# Patient Record
Sex: Male | Born: 1940 | Race: White | Hispanic: No | Marital: Married | Smoking: Never smoker
Health system: Southern US, Community
[De-identification: ages and names within clinical notes are randomized; demographics above are authoritative.]

## PROBLEM LIST (undated history)

## (undated) DIAGNOSIS — I2510 Atherosclerotic heart disease of native coronary artery without angina pectoris: Secondary | ICD-10-CM

## (undated) DIAGNOSIS — R011 Cardiac murmur, unspecified: Secondary | ICD-10-CM

## (undated) DIAGNOSIS — I10 Essential (primary) hypertension: Secondary | ICD-10-CM

## (undated) DIAGNOSIS — I509 Heart failure, unspecified: Secondary | ICD-10-CM

## (undated) DIAGNOSIS — M1990 Unspecified osteoarthritis, unspecified site: Secondary | ICD-10-CM

## (undated) DIAGNOSIS — H547 Unspecified visual loss: Secondary | ICD-10-CM

## (undated) DIAGNOSIS — I350 Nonrheumatic aortic (valve) stenosis: Secondary | ICD-10-CM

## (undated) DIAGNOSIS — E785 Hyperlipidemia, unspecified: Secondary | ICD-10-CM

## (undated) HISTORY — DX: Hyperlipidemia, unspecified: E78.5

## (undated) HISTORY — DX: Unspecified visual loss: H54.7

## (undated) HISTORY — PX: HERNIA REPAIR: SHX51

## (undated) HISTORY — DX: Nonrheumatic aortic (valve) stenosis: I35.0

## (undated) HISTORY — DX: Cardiac murmur, unspecified: R01.1

---

## 2003-02-08 ENCOUNTER — Inpatient Hospital Stay (HOSPITAL_COMMUNITY): Admission: AD | Admit: 2003-02-08 | Discharge: 2003-02-15 | Payer: Self-pay | Admitting: Cardiology

## 2003-02-08 HISTORY — PX: CARDIAC CATHETERIZATION: SHX172

## 2003-02-09 ENCOUNTER — Encounter (INDEPENDENT_AMBULATORY_CARE_PROVIDER_SITE_OTHER): Payer: Self-pay | Admitting: *Deleted

## 2003-02-09 HISTORY — PX: TISSUE AORTIC VALVE REPLACEMENT: SHX2527

## 2003-04-13 HISTORY — PX: INGUINAL HERNIA REPAIR: SHX194

## 2003-04-23 ENCOUNTER — Ambulatory Visit (HOSPITAL_COMMUNITY): Admission: RE | Admit: 2003-04-23 | Discharge: 2003-04-23 | Payer: Self-pay | Admitting: General Surgery

## 2006-03-23 ENCOUNTER — Ambulatory Visit: Payer: Self-pay | Admitting: Internal Medicine

## 2006-03-25 ENCOUNTER — Ambulatory Visit (HOSPITAL_BASED_OUTPATIENT_CLINIC_OR_DEPARTMENT_OTHER): Admission: RE | Admit: 2006-03-25 | Discharge: 2006-03-25 | Payer: Self-pay | Admitting: Internal Medicine

## 2006-03-28 ENCOUNTER — Ambulatory Visit: Payer: Self-pay | Admitting: Internal Medicine

## 2006-04-16 ENCOUNTER — Ambulatory Visit: Payer: Self-pay | Admitting: Internal Medicine

## 2009-08-21 ENCOUNTER — Ambulatory Visit: Payer: Self-pay | Admitting: Cardiology

## 2010-03-05 ENCOUNTER — Ambulatory Visit (INDEPENDENT_AMBULATORY_CARE_PROVIDER_SITE_OTHER): Payer: Medicare Other | Admitting: Cardiology

## 2010-03-05 DIAGNOSIS — Z954 Presence of other heart-valve replacement: Secondary | ICD-10-CM

## 2010-03-05 DIAGNOSIS — E7800 Pure hypercholesterolemia, unspecified: Secondary | ICD-10-CM

## 2010-05-30 NOTE — Procedures (Signed)
NAME:  [name] [name], [name] [name] NO.:  [id number]   MEDICAL RECORD NO.:  [id number]          PATIENT TYPE:  OUT   LOCATION:  SLEEP CENTER                 FACILITY:  [facility]   PHYSICIAN:  Clinton D. Maple Hudson, MD, FCCP, FACPDATE OF BIRTH:  1940/04/30   DATE OF STUDY:                            NOCTURNAL POLYSOMNOGRAM   INDICATIONS FOR STUDY:  Hypersomnia with sleep apnea. Epworth sleepiness  score 18/24. BMI 25, Weight 187 pounds.   HOME MEDICATIONS:  Listed and reviewed.   SLEEP ARCHITECTURE:  Short total sleep time 232 minutes with sleep  efficiency 65%. Stage 1 was 4%. Stage 2 76%. Stages 3 and 4 absent%. REM  20% of total sleep time. Sleep latency 12 minutes. REM latency 56  minutes. Awake after sleep onset 92 minutes. Arousal index 23.   Aspirin was taken at bedtime.   RESPIRATORY DATA:  Apnea/hypopnea index (AHI/RDI) 14.2 obstructive  events per hour, indicating mild obstructive sleep apnea/hypopnea  syndrome. There were 2 central apneas, 30 obstructive apneas and 23  hypopneas. Events were more common while supine. REM AHI 0. There were  insufficient events to qualify for CPAP titration by split protocol on  this study night.   OXYGEN DATA:  Mild to moderate snoring with an oxygen desaturation to a  nadir of 85%. Mean oxygen saturation through the study was 94% on room  air.   CARDIAC DATA:  Normal sinus rhythm with occasional PVC.   MOVEMENT/PARASOMNIA:  Occasional limb jerk with little effect on sleep.   IMPRESSION/RECOMMENDATION:  1. 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  2. CPAP split protocol criteria were not met on this study night. If      more conservative measures are insufficient, consider return for      CPAP titration or alternative therapies as appropriate.     Clinton D. Maple Hudson, MD, [facility], FACP  Diplomate, Research scientist (medical) of Sleep Medicine  Electronically Signed    CDY/MEDQ  D:  03/28/2006 15:11:44  T:  03/29/2006 10:05:27  Job:  [id number]

## 2010-05-30 NOTE — Assessment & Plan Note (Signed)
[facility]                             PULMONARY OFFICE NOTE   NAME:[name] [name], [name] [name]                          MRN:          [id number]  DATE:03/23/2006                            DOB:          Mar 18, 1940    PULMONARY CONSULTATION   DATE OF CONSULTATION:  March 23, 2006.   PROBLEM:  Sleep medicine consultation at the kind referral of Dr.  Patty Sermons for this 70 year old man whose wife is concerned that he  snores loudly  and is too sleepy.   HISTORY OF PRESENT ILLNESS:  She has wanted him to have a sleep study  for some years and he resisted until he was on Medicare.  She notices he  snores very badly when supine, either in a recliner or in his bed.  He  will have apnea in those situations and sleep is disturbed by kicking  and startling as he gasps.  It has not gotten worse in recent years.  She feels he is apt to fall asleep anytime if he sits quietly.  Bedtime  is between 10 and 11 and he tends to fall asleep in his chair.  Sleep  latency is very short.  He is not much aware of actually awakening at  night before getting up before 4 and 6 A.M.   MEDICATIONS:  1. Aspirin 325 mg.  2. Alphagan and Xalatan eye drops.   ALLERGIES:  No medications allergies.   REVIEW OF SYSTEMS:  Snoring and witnessed apnea's.  Weight has been  stable.  Legs sometimes feel restless just before going to sleep.  Significant daytime sleepiness.  He will take naps in his chair in the  evening.   PAST MEDICAL HISTORY:  Tonsillectomy, glaucoma, aortic valve  replacement.  He denies history of lung or thyroid disease.  Hernia  surgery.   SOCIAL HISTORY:  Never smoked.  No caffeine.  Married with children.  Works as a Visual merchandiser, Development worker, community.  He describes work  as physical and hard.   FAMILY HISTORY:  Nobody recognized with sleep problems.  Others have  allergies, asthma and cancer.   PHYSICAL EXAMINATION:  VITAL SIGNS:  Weight 187 pounds.  Blood  pressure  138/84. Pulse regular 61.  Room air saturation 97%.  GENERAL APPEARANCE:  This is a trim, tall, sturdy-appearing gentleman in  no distress.  HEENT:  Nasal septum is somewhat deviated to the right.  Palate spacing  2 to 3/4.  Voice quality is normal with normal stridor or thyromegaly.  CHEST:  Quiet lung fields with unlabored breathing.  HEART:  There is a grade 1/6 systolic murmur in the aortic space.  Rare  skipped beat.  EXTREMITIES:  There is no restlessness or tremor.  No peripheral edema  or cyanosis.   IMPRESSION:  Probable obstructive sleep apnea.   PLAN:  We are scheduling a split protocol nocturnal polysomnogram with  [facility].  I have discussed good sleep hygiene.  We  have covered the basics of medical issues of obstructive sleep apnea.  They will return after the study is  completed.  I do appreciate the  chance to meet him.     Clinton D. Maple Hudson, MD, [facility], FACP  Electronically Signed    CDY/MedQ  DD: 03/23/2006  DT: 03/25/2006  Job #: [id number]   cc:   [facility]

## 2010-05-30 NOTE — Cardiovascular Report (Signed)
NAME:  [name] [name], [name] [name] NO.:  [id number]   MEDICAL RECORD NO.:  [id number]                   PATIENT TYPE:  OIB   LOCATION:  2860                                 FACILITY:  [facility]   PHYSICIAN:  Peter M. Swaziland, M.D.               DATE OF BIRTH:  14-Mar-1940   DATE OF PROCEDURE:  02/08/2003  DATE OF DISCHARGE:                              CARDIAC CATHETERIZATION   INDICATION FOR PROCEDURE:  The patient is a 70 year old white male who has  been diagnosed with severe aortic stenosis.  History of longstanding murmur.  Mild symptoms of dyspnea on exertion and lightheadedness.   ACCESS:  Via the right femoral artery and vein using standard Seldinger  technique.   EQUIPMENT:  6-French 4-cm right and left Judkins catheters, 6-French pigtail  catheter, 7-French arterial sheath, 8-French venous sheath, and 7-French  balloon-tipped Swan-Ganz catheter.   PROCEDURE:  Right and left heart catheterization, coronary angiography, and  left ventricular angiography.   MEDICATIONS:  Local anesthesia 1% Xylocaine.   COMPLICATIONS:  None.   CONTRAST:  250 mL of Omnipaque.   HEMODYNAMIC DATA:  1. Right atrial pressure is 7/5 with a mean of 5 mmHg.  2. Right ventricular pressure is 28 with EDP of 7 mmHg.  3. Pulmonary artery pressure is 24/8 with a mean of 14 mmHg.  4. Pulmonary capillary wedge pressure is 13/11 with a mean of 9 mmHg.  5. Pullback left ventricular pressure is 185 with EDP of 14.  6. Aortic pressure is 134/61 with a mean of 87 mmHg.  7. Peak aortic valve gradient is 51 mmHg with a mean gradient of 40 mmHg.  8. Aortic valve area is measured at 0.9 cm2.  9. Cardiac output by Fick is 4.5 L/min with an index of 2.26 by     thermodilution.  10.      Cardiac output is 4.9 with an index of 2.46.  11.      There is no significant mitral valve gradient.   ANGIOGRAPHIC DATA:  The left ventricular angiography demonstrates normal  left ventricular size and  contractility with normal systolic function.  Ejection fraction is estimated at 65%.  There is no mitral insufficiency.  The aortic valve is very heavily calcified with reduced mobility.  The  aortic root is mildly dilated.   The left coronary artery arises normally.  There is essentially a shared  ostium between the left circumflex and LAD vessels.  It is extremely  difficult to engage the LAD portion of this vessel.  We did get good  engagement of the left circumflex selectively.  Despite multiple catheter  uses, we were unable to directly engage the LAD but flush shots demonstrated  this vessel well.  The left main coronary artery is normal.  The left  anterior descending artery is normal.  The left circumflex coronary artery  demonstrates a mild eccentric plaque  in the mid vessel up to 30%.  The right  coronary artery is a dominant vessel and appears normal.   FINAL INTERPRETATION:  1. Severe aortic stenosis.  2. Normal left ventricular function.  3. No significant obstructive atherosclerotic coronary artery disease.  4. Normal right heart pressures.                                               Peter M. Swaziland, M.D.    PMJ/MEDQ  D:  02/08/2003  T:  02/09/2003  Job:  [id number]   cc:   Loma Sender  [address]  [place]  [place] [address]  Fax: [phone]   Cassell Clement, M.D.  [address]  [place]  [place] [address]  Fax: [phone]   Lorne Skeens. Hoxworth, M.D.  [address]  [place]  [place] [address]  Fax: [phone]   Gwenith Daily. Tyrone Sage, M.D.  [address]  [place]  [place] [address]

## 2010-05-30 NOTE — Op Note (Signed)
NAME:  [name] [name], [name] [name] NO.:  [id number]   MEDICAL RECORD NO.:  [id number]                   PATIENT TYPE:  INP   LOCATION:  2311                                 FACILITY:  [facility]   PHYSICIAN:  Gwenith Daily. Tyrone Sage, M.D.            DATE OF BIRTH:  Sep 12, 1940   DATE OF PROCEDURE:  02/09/2003  DATE OF DISCHARGE:                                 OPERATIVE REPORT   PREOPERATIVE DIAGNOSIS:  Critical aortic stenosis.   POSTOPERATIVE DIAGNOSIS:  Critical aortic stenosis.   OPERATION/PROCEDURE:  Aortic valve replacement with a #25 pericardial tissue  valve.   SURGEON:  Gwenith Daily. Tyrone Sage, M.D.   FIRST ASSISTANT:  Rowe Clack, P.A.-C.   BRIEF HISTORY:  The patient is a 70 year old male with longstanding known  murmur who presents with increasing symptoms of congestive heart failure and  chest discomfort.  At the same time the patient had continued working full-  time in spite of symptoms.  Because of an inguinal hernia evaluation, he was  again seen followed by cardiology which demonstrated high-grade stenosis of  the aortic valve by echocardiogram.  Because of the symptoms, his hernia  repair was delayed and he was evaluated for aortic valve replacement.  After  discussion with the patient, he decided he did not wish to take Coumadin so  a tissue valve was selected.  A cardiac catheterization was carried out  which showed intact coronary arteries with slight luminal irregularities.  The patient agreed to the procedure and signed informed consent.   DESCRIPTION OF PROCEDURE:  With Swan-Ganz and arterial line monitors in  place, the patient underwent general endotracheal anesthesia.  The skin of  the chest was prepped with Betadine and draped in the usual sterile manner.  Median sternotomy was performed.  Pericardium was opened.  Overall  ventricular function appeared preserved.  The patient did have evidence of  significant left ventricular hypertrophy, both  by observation of the heart  and measurement by the TEE.  The patient was systemically heparinized.  Ascending aorta and aortic root __________.  Cardioplegia needle was  introduced into the ascending aorta.  The patient was placed on  cardiopulmonary bypass 2.4 L per minute per sq m.  His body temperature was  cooled to 28 degrees.  Aortic crossclamp was applied and 500 mL of cold  blood potassium cardioplegia was administered with diastolic arrest of the  heart. Additional cold blood cardioplegia was administered directly into the  right and left coronary ostium.   The aortic valve was then inspected and was very highly calcified and very  distorted though it did appear to be a tricuspid valve with high degree of  calcification but because of the extent of calcification, it was difficult  to determine.  The valve was excised and the annulus debrided, taking care  to remove all calcific debris.  This valve was then excised and the  annulus  sized for a 25 pericardial tissue valve.  Aortic valve was secured in place  with interrupted #2 Ticron pledgeted sutures.  Aortotomy was then closed  with a horizontal mattress 3-0 Prolene suture.   A 16-gauge needle was introduced in the left ventricular apex to further de-  air the heart.  The patient was then ventilated and the aortic crossclamp  was removed with total crossclamp time of 70 minutes.  The patient converted  to a sinus rhythm.  Atrial and ventricular pacing wires applied.  The  patient's body temperature was then rewarmed to 37 degrees.  He was then  ventilated and weaned from cardiopulmonary bypass without difficulty.  He  remained hemodynamically stable.  He was decannulated in the usual fashion.  Protamine sulfate was administered with operative field __________.  Two  ventricular pacing wires were applied.  Two mediastinal tubes were placed.  Sternum was closed with #6 stainless steel wire.  Fascia closed with  interrupted 2-0  Vicryl, running 3-0 Vicryl in the subcutaneous tissue, 4-0  silk subcuticular stitch in the skin edges.  Dry dressings were applied.  Sponge and needle counts were reported as correct at the completion of the  procedure.  The patient tolerated the procedure without obvious  complications and was transferred subsequently to the intensive care unit  for further postoperative care.                                               Gwenith Daily Tyrone Sage, M.D.    Tyson Babinski  D:  02/11/2003  T:  02/11/2003  Job:  [id number]   cc:   Peter M. Swaziland, M.D.  [address]  [place], [place] [address]  Fax: [phone]

## 2010-05-30 NOTE — Op Note (Signed)
NAME:  [name] [name], [name] [name] NO.:  [id number]   MEDICAL RECORD NO.:  [id number]                   PATIENT TYPE:  OIB   LOCATION:  2899                                 FACILITY:  [facility]   PHYSICIAN:  Sharlet Salina T. Hoxworth, M.D.          DATE OF BIRTH:  02-Apr-1940   DATE OF PROCEDURE:  04/23/2003  DATE OF DISCHARGE:                                 OPERATIVE REPORT   PREOPERATIVE DIAGNOSIS:  Right inguinal hernia.   POSTOPERATIVE DIAGNOSIS:  Right inguinal hernia.   OPERATION PERFORMED:  Repair of right inguinal hernia with mesh.   SURGEON:  Lorne Skeens. Hoxworth, M.D.   ANESTHESIA:  Local with intravenous sedation.   INDICATIONS FOR PROCEDURE:  [name] [name] is a 70 year old white male who  presents with a markedly symptomatic right inguinal hernia confirmed on  exam.  Open repair using mesh has been recommended and accepted.  The nature  of the procedure, its indications and risks of bleeding, infection and  recurrence were discussed and understood.  He is now brought to the  operating room for this procedure.   DESCRIPTION OF PROCEDURE:  The patient was brought to the operating room and  placed in supine position on the operating table and intravenous sedation  was administered.  He received preoperative antibiotics.  The right groin  was sterilely prepped and draped.  Local anesthesia was used to block the  ilioinguinal nerve and then to infiltrate the area of the incision.  An  oblique incision was made in the right groin and dissection carried down  through subcutaneous tissue.  Crossing veins doubly clamped, divided and  ligated with 3-0 Vicryl.  The external oblique was identified, anesthetized  and divided along the lines of its fibers through the external ring.  The  ilioinguinal nerve was anesthetized, dissected free and protected throughout  the remainder of the dissection.  The cord was dissected up off the floor at  the pubic tubercle and  cremasteric fibers divided bilaterally completely  freeing the cord back to the internal ring.  The cord, pubic tubercle floor,  inguinal ligament and rectus sheath were anesthetized.  The floor was intact  but there was a good sized indirect sac running with the cord structures.  This was dissected free using blunt cautery dissection up to and above the  level of the internal ring at which point it was suture ligated with 2-0  silk, divided and removed.  There was also a large cord lipoma that was  dissected away from the cord structures with blunt and cautery dissection,  clamped at the internal ring, divided, removed and tied with 3-0 Vicryl.  Following this, a piece of Parietex mesh was trimmed to size to fit the  floor of the canal with tails around the cord of the internal ring. This was  sutured initially to the pubic tubercle and then to the inguinal ligament in  the  iliopubic tract with running 2-0 Prolene working from medial to lateral.  Medially, the mesh was sutured to the edge of the rectus sheath with  interrupted 2-0 Prolene.  Tails were then tacked together lateral to the  cord with interrupted 2-0 Prolene creating a new internal ring snug to a  fingertip.  The cord and ilioinguinal nerves were returned to their anatomic  position.  The external oblique was closed over this with a running 3-0  Vicryl.  The subcu was closed with  running 3-0 Vicryl and skin with running subcuticular 4-0 Monocryl and Steri-  Strips.  Sponge, needle and instrument counts were correct.  Dry sterile  dressings were applied and the patient was taken to recovery in good  condition.                                               Lorne Skeens. Hoxworth, M.D.    Tory Emerald  D:  04/23/2003  T:  04/23/2003  Job:  [id number]

## 2010-05-30 NOTE — H&P (Signed)
NAME:  [name] [name], [name] [name] NO.:  [id number]   MEDICAL RECORD NO.:  [id number]                   PATIENT TYPE:  OIB   LOCATION:                                       FACILITY:  [facility]   PHYSICIAN:  Peter M. Swaziland, M.D.               DATE OF BIRTH:  08-30-1940   DATE OF ADMISSION:  02/08/2003  DATE OF DISCHARGE:                                HISTORY & PHYSICAL   HISTORY OF PRESENT ILLNESS:  [name] [name] is a 70 year old white male who has a  longstanding history of cardiac murmur dating 74 to 30 years.  He was  followed serially at [facility] until approximately 10 years ago at which time he  quit going for his follow-ups.  Approximately a month ago he was being  evaluated for a right inguinal hernia where cardiac evaluation was obtained  to clear him for surgery.  On that evaluation, he had an echocardiogram  which demonstrated critical aortic stenosis.  He had a valve area of 0.5 sq  cm with a peak instantaneous gradient of 155 mmHg and a mean gradient of 75  mmHg.  He also had mild aortic insufficiency.  He had severe LVH with normal  systolic function.  The patient is quite stoic and typically denies any  significant symptoms.  He does admit to feeling heavy in his chest at times  when he is working and feels more tired than normal.  He has also had some  shortness of breath with exertion.  He has had no dizziness, palpitations,  or syncope.  Because of the critical nature of his aortic stenosis, it is  recommended he undergo valve replacement and he is now admitted for cardiac  catheterization as part of his preoperative evaluation.   MEDICATIONS:  A baby aspirin daily.   PAST MEDICAL HISTORY:  Unremarkable.   SOCIAL HISTORY:  The patient works in [employer].  He denies tobacco or  alcohol use.  He is married and has four children.   FAMILY HISTORY:  Father died at age 4 of cancer.  Mother died at age 20 of  cardiac disease.  One brother recently  underwent an aortic valve replacement  for severe aortic stenosis and a congenitally bicuspid valve.  He also has  three other sisters who have been told they had murmurs.   REVIEW OF SYMPTOMS:  As noted in HPI, otherwise noted.   PHYSICAL EXAMINATION:  GENERAL APPEARANCE:  The patient is a pleasant white  male in no apparent distress.  VITAL SIGNS:  Weight 188, blood pressure 140/80, pulse 60 and regular.  HEENT:  Pupils are equal, round and reactive. Conjunctivae clear.  Oropharynx is clear.  Dentition is in good repair.  NECK:  Supple without JVD, adenopathy, thyromegaly or bruits.  His carotid  upstrokes are significantly diminished and delayed with radiating murmur.  LUNGS:  Clear.  CARDIOVASCULAR:  Regular rate and rhythm, normal S1 and S2 with a harsh  grade 3/6 systolic murmur heard best at the aortic area radiating to the  apex and to the carotids.  There is no S3.  ABDOMEN:  Abdomen is soft and nontender without masses or bruits.  EXTREMITIES:  Without edema.  Pulses 2+ and symmetric.  The patient does  have a large right inguinal hernia.  NEUROLOGIC:  Examination is nonfocal.   LABORATORY DATA:  ECG shows sinus bradycardia with LVH and significant  strain.  There is left axis deviation.  Echocardiogram  is as noted.   Chest x-ray shows no active disease with cardiomegaly.   His coags are normal.  CMET is normal with a BUN of 22, creatinine 1.0.  Other electrolytes are normal.  CBC is normal.   IMPRESSION:  1. Critical aortic stenosis, mildly symptomatic.  2. Right inguinal hernia.   PLAN:  Admit for cardiac catheterization to be followed the next day with  aortic valve replacement.                                                Peter M. Swaziland, M.D.    PMJ/MEDQ  D:  02/06/2003  T:  02/06/2003  Job:  [id number]   cc:   Gwenith Daily. Tyrone Sage, M.D.  [address]  [place]  [place] [address]   Lorne Skeens. Hoxworth, M.D.  [address]  [place]  [place]  [address]  Fax: [phone]   Loma Sender  [address]  [place]  [place] [address]  Fax: [phone]   Cassell Clement, M.D.  [address], [address]  [place]  [place] [address]  Fax: [phone]

## 2010-05-30 NOTE — Assessment & Plan Note (Signed)
[facility]                             PULMONARY OFFICE NOTE   NAME:[name] [name], [name] [name]                          MRN:          [id number]  DATE:04/16/2006                            DOB:          09-21-1940    PROBLEM:  Obstructive sleep apnea.   HISTORY:  His sleep study demonstrated mild to moderate obstructive  apnea with an index of 14.2 per hour. Events were more common while  supine. Snoring was moderate with desaturation to 85%. He could not be  titrated for CPAP by protocol on that study night. He and his wife say  that he is worse while sleeping in a recliner or supine. In either case,  the problem is laying flat back. If he tips his head to the side, his  wife notices snoring is much less.   OBJECTIVE:  Weight 189 pounds, blood pressure 122/64, pulse regular 48,  room air saturation 98%. He is a trim, muscular man. He is alert. His  nasal airway is clear. Lungs are clear. Heart sounds are regular without  murmur.   IMPRESSION:  Mild obstructive apnea. We discussed treatment options. He  may become a candidate for CPAP titration, but I emphasized conservative  measures first. He certainly does not need to lose weight, but it may  help substantially if he can stay off of his back.   PLAN:  1. Try sample Veramyst, 2 sprays each nostril daily.  2. Sleep off of flat of back.  3. Booklet on sleep apnea was given.  4. Schedule return once more in a month.     Clinton D. Maple Hudson, MD, Tonny Bollman, FACP  Electronically Signed    CDY/MedQ  DD: 04/18/2006  DT: 04/19/2006  Job #: [id number]   cc:   Cassell Clement, M.D.

## 2010-05-30 NOTE — Discharge Summary (Signed)
NAME:  [name] [name], [name] [name] NO.:  [id number]   MEDICAL RECORD NO.:  [id number]                   PATIENT TYPE:  INP   LOCATION:  2004                                 FACILITY:  [facility]   PHYSICIAN:  Gwenith Daily. Tyrone Sage, M.D.            DATE OF BIRTH:  Apr 22, 1940   DATE OF ADMISSION:  02/08/2003  DATE OF DISCHARGE:  02/15/2003                                 DISCHARGE SUMMARY   HISTORY OF PRESENT ILLNESS:  This is a 70 year old male with a longstanding  known murmur who presented recently with increasing symptoms of congestive  heart failure and chest discomfort.  The patient continued to work in spite  of symptoms.  He was being evaluated for an inguinal hernia repair, and  cardiology was again consulted, and echocardiogram confirmed a high-grade  stenosis of the aortic valve.  The patient was recommended for aortic valve  replacement, and consultation was obtained with Sheliah Plane, M.D.  He  evaluated the patient and his studies and recommended proceeding with the  surgery.  The patient had an echocardiogram calculated valve area of 0.5  with a mean gradient of 75 mmHg.  He had mild aortic insufficiency with  severe left ventricular hypertrophy but normal left ventricular function.  He was admitted this hospitalization for the procedure.   PAST MEDICAL HISTORY:  Unremarkable.   FAMILY HISTORY:  Remarkable for brother who had aortic valve replacement  November 2004.  Additionally, all three sisters have heart murmurs, and all  four of his children have heart murmurs.   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   CURRENT MEDICATIONS:  1. Timolol eye drops.  2. Aspirin 1 every other day.   REVIEW OF SYSTEMS:  Please see History and Physical done at the time of  admission.   PHYSICAL EXAMINATION:  Please see History and Physical done at the time of  admission.   HOSPITAL COURSE:  The patient was admitted  electively and on February 09, 2003, taken to the operating room where he underwent the following  procedure.  An aortic valve replacement with a #25 pericardial tissue valve.  The procedure was performed by Sheliah Plane, M.D.  The patient tolerated  the procedure well and was taken to the surgical intensive care unit in  stable condition.   POSTOPERATIVE HOSPITAL COURSE:  The patient has done well.  His incisions  are healing without signs of infection.  He has been started on Coumadin but  is not felt required to be therapeutic prior to discharge.  Most recent INR  on February 15, 2003, is 1.3, and he will receive 7.5 mg and have INR checked  in morning at Dr. Yevonne Pax office.  His rhythm is currently stable, but  he did have postoperative atrial fibrillation and was initially treated with  both beta blockers and amiodarone.  However, the amiodarone  has been  discontinued secondary to nausea.  All routine lines, monitors, and drainage  devices are discontinued in the standard fashion.  The patient is  hemodynamically stable. Laboratory values are felt to be stable.  Most  recent hemoglobin and hematocrit dated January 13, 2003, revealing moderate  anemia with hemoglobin 9.8 and hematocrit 28.6, respectively.  Electrolytes:  BUN and creatinine are stable and  within normal limits.  He has had  slightly elevated glucose intermittently on non-fasting samples.  Liver  function tests dated February 09, 2003, were  within normal limits.  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  The  patient is tolerating cardiac rehabilitation phase I modalities without  significant difficulty.  Apparently he is felt to be quite stable for  discharge on today's date, February 15, 2003.   DISCHARGE MEDICATIONS:  1. Coumadin 5 mg daily and as directed by Dr. Patty Sermons.  2. Toprol XL 25 mg daily.  3. He is to continue his Timolol eye drops as before.  4. For pain, Tylox 1 or 2  every 4 to 6 hours as needed.   INSTRUCTIONS:  The patient received written instructions in regards to  medications, activity, diet, wound care, and followup to include Dr.  Tyrone Sage February 24 at 12:10 p.m.  Dr. Sw[place] or Dr. Patty Sermons will see the  patient in two weeks.  They will also monitor his Coumadin.   FINAL DIAGNOSES:  1. Severe aortic stenosis as described.  2. Postoperative anemia.  3. Postoperative atrial fibrillation.      Rowe Clack, P.A.-C.                    Gwenith Daily Tyrone Sage, M.D.    Sherryll Burger  D:  02/15/2003  T:  02/15/2003  Job:  [id number]   cc:   Gwenith Daily. Tyrone Sage, M.D.  [address]  [place]  [place] [address]   Peter M. Sw[place], M.D.  [address]  [place], [place] [address]  Fax: [phone]

## 2010-05-30 NOTE — Op Note (Signed)
NAME:  [name] [name], [name] [name] NO.:  [id number]   MEDICAL RECORD NO.:  [id number]                   PATIENT TYPE:  INP   LOCATION:  2311                                 FACILITY:  [facility]   PHYSICIAN:  Judie Petit, M.D.              DATE OF BIRTH:  Jan 07, 1941   DATE OF PROCEDURE:  02/09/2003  DATE OF DISCHARGE:                                 OPERATIVE REPORT   INDICATIONS:  [name] is a 70 year old male who presents today for aortic  valve replacement by Dr. Sheliah Plane.  Transesophageal echocardiogram  will be utilized for cardiac structure and function.   DESCRIPTION OF PROCEDURE:  The patient was brought to the holding area the  morning of surgery where radial artery and pulmonary artery lines were  inserted under local anesthesia with sedation.  The patient was then taken  to the operating room for routine induction of anesthesia.  The trachea was  intubated.  Following intubation of the trachea, the transesophageal  echocardiogram probe was passed oropharyngeally without difficulty.   PRECARDIOPULMONARY BYPASS TRANSESOPHAGEAL ECHOCARDIOGRAM:  Left ventricle:  The ventricle was concentrically thickened.  Papillary muscles were well  outlined.  There were no masses notable within the left ventricular chamber.  There appeared to be normal ejection fraction.   Aortic valve:  The aortic valve appeared to be trileaflet in nature.  However, heavily calcified.  There was severe aortic stenosis noted.  There  was 1+ aortic regurgitant flow noted on Doppler examination.   Mitral valve:  The valve appeared to function appropriately.  There was  trace mitral regurgitant flow on Doppler examination.  There was no reversal  of flow back pulse wave examination.   Left atrium:  The left atrial appendage was clear of any masses.  The  interatrial septum appeared intact.   Right ventricle:  The right ventricle appeared grossly within normal limits.  There  was trace tricuspid regurgitant flow on Doppler examination.   The patient was placed on cardiopulmonary bypass.  Aortic valve replacement  was carried out.  De-airing maneuvers were carried out.  The patient was  rewarmed and separated from cardiopulmonary bypass with the initial attempt.   POST CARDIOPULMONARY BYPASS TRANSESOPHAGEAL ECHOCARDIOGRAM:  Limited  examination.   Left ventricle:  In the early bypass period the chamber revealed evidence of  low volume but with replacement of volume, the contractile pattern improved.  There was __________evidence of good ejection fraction.   Aortic valve:  The aortic valve was seated in place.  There was no  significant regurgitant flow noted.  There did not appear to be any  obstruction to flow.   The rest of the cardiac exam was as previously described.  The patient was  subsequently taken to the cardiac intensive care unit in stable condition.  Judie Petit, M.D.    CE/MEDQ  D:  02/09/2003  T:  02/10/2003  Job:  [id number]

## 2011-03-06 ENCOUNTER — Encounter: Payer: Self-pay | Admitting: *Deleted

## 2011-03-06 DIAGNOSIS — G4710 Hypersomnia, unspecified: Secondary | ICD-10-CM | POA: Insufficient documentation

## 2011-03-06 DIAGNOSIS — I350 Nonrheumatic aortic (valve) stenosis: Secondary | ICD-10-CM | POA: Insufficient documentation

## 2011-03-06 DIAGNOSIS — H409 Unspecified glaucoma: Secondary | ICD-10-CM | POA: Insufficient documentation

## 2011-03-11 ENCOUNTER — Ambulatory Visit (INDEPENDENT_AMBULATORY_CARE_PROVIDER_SITE_OTHER): Payer: Medicare Other | Admitting: Cardiology

## 2011-03-11 ENCOUNTER — Encounter: Payer: Self-pay | Admitting: Cardiology

## 2011-03-11 VITALS — BP 120/68 | HR 50 | Ht 72.0 in | Wt 177.0 lb

## 2011-03-11 DIAGNOSIS — Z952 Presence of prosthetic heart valve: Secondary | ICD-10-CM

## 2011-03-11 DIAGNOSIS — N400 Enlarged prostate without lower urinary tract symptoms: Secondary | ICD-10-CM

## 2011-03-11 DIAGNOSIS — N401 Enlarged prostate with lower urinary tract symptoms: Secondary | ICD-10-CM | POA: Insufficient documentation

## 2011-03-11 DIAGNOSIS — E7800 Pure hypercholesterolemia, unspecified: Secondary | ICD-10-CM

## 2011-03-11 DIAGNOSIS — I350 Nonrheumatic aortic (valve) stenosis: Secondary | ICD-10-CM

## 2011-03-11 DIAGNOSIS — Z8042 Family history of malignant neoplasm of prostate: Secondary | ICD-10-CM

## 2011-03-11 DIAGNOSIS — Z954 Presence of other heart-valve replacement: Secondary | ICD-10-CM

## 2011-03-11 DIAGNOSIS — I359 Nonrheumatic aortic valve disorder, unspecified: Secondary | ICD-10-CM

## 2011-03-11 NOTE — Assessment & Plan Note (Signed)
The patient has been doing well following his aortic valve replacement.  He is not having any exertional chest pain or shortness of breath.  Has had no dizziness or syncope.  He has not been aware of any palpitations.  He is aware of SBE prophylaxis.  He has not been to his dentist in over a year however

## 2011-03-11 NOTE — Assessment & Plan Note (Signed)
The patient has a history of hypercholesterolemia.  He has not had a recent cholesterol drawn.  He is not presently on any statin therapy.  We are checking lipids today

## 2011-03-11 NOTE — Progress Notes (Signed)
[name] [name] Date of Birth:  1940-03-06 [facility] [address] [place], [place]  [address] [phone]  Fax   [phone]  HPI: This pleasant 71 year old gentleman is seen for a scheduled followup office visit.  He has a history of previous severe aortic stenosis and has a aortic valve replacement with a tissue valve.  He also has a history of hypercholesterolemia.  He also has a history of BPH with nocturia and his PSA a year ago was 3.31  Current Outpatient Prescriptions  Medication Sig Dispense Refill  . amoxicillin (AMOXIL) 500 MG tablet Take 500 mg by mouth as needed. Re-valve replacement      . aspirin 325 MG tablet Take 325 mg by mouth daily.      Marland Kitchen CRANBERRY PO Take by mouth.      . Ibuprofen 200 MG CAPS Take by mouth. As needed      . latanoprost (XALATAN) 0.005 % ophthalmic solution 1 drop at bedtime.      . Omega-3 Fatty Acids (FISH OIL PO) Take by mouth daily.      . Saw Palmetto, Serenoa repens, (SAW PALMETTO PO) Take by mouth daily.      . vitamin C (ASCORBIC ACID) 500 MG tablet Take 500 mg by mouth daily.        No Known Allergies  Patient Active Problem List  Diagnoses  . Aortic stenosis  . Sleep apnea  . Glaucoma    History  Smoking status  . Never Smoker   Smokeless tobacco  . Not on file    History  Alcohol Use     Family History  Problem Relation Age of Onset  . Heart disease Mother   . Cancer Father     Review of Systems: The patient denies any heat or cold intolerance.  No weight gain or weight loss.  The patient denies headaches or blurry vision.  There is no cough or sputum production.  The patient denies dizziness.  There is no hematuria or hematochezia.  The patient denies any muscle aches or arthritis.  The patient denies any rash.  The patient denies frequent falling or instability.  There is no history of depression or anxiety.  All other systems were reviewed and are negative.   Physical Exam: Filed Vitals:     03/11/11 1635  BP: 120/68  Pulse: 50   the general appearance reveals a well-developed well-nourished gentleman in no distress.Pupils equal and reactive.   Extraocular Movements are full.  There is no scleral icterus.  The mouth and pharynx are normal.  The neck is supple.  The carotids reveal no bruits.  The jugular venous pressure is normal.  The thyroid is not enlarged.  There is no lymphadenopathy.  The chest is clear to percussion and auscultation. There are no rales or rhonchi. Expansion of the chest is symmetrical.  Heart reveals a grade 2/6 systolic ejection murmur across the prosthetic aortic valve.  No diastolic murmur.  No gallop or rub.The abdomen is soft and nontender. Bowel sounds are normal. The liver and spleen are not enlarged. There Are no abdominal masses. There are no bruits.  The pedal pulses are good.  There is no phlebitis or edema.  There is no cyanosis or clubbing. Strength is normal and symmetrical in all extremities.  There is no lateralizing weakness.  There are no sensory deficits.      Assessment / Plan: Continue same medication.  Her weight results of today's labs.  Recheck in  6 months for office visit and fasting lab work.

## 2011-03-11 NOTE — Assessment & Plan Note (Signed)
The patient has a history of symptoms of BPH with nocturia.  He is no longer taking his saw palmetto.  His father had prostate cancer and died at about the age of [name] [name].  We are checking a PSA today

## 2011-03-11 NOTE — Patient Instructions (Signed)
Will obtain labs today and call you with the results (LP/HFP/BMET/PSA). Your physician recommends that you continue on your current medications as directed. Please refer to the Current Medication list given to you today. Your physician wants you to follow-up in: 6  months You will receive a reminder letter in the mail two months in advance. If you don't receive a letter, please call our office to schedule the follow-up appointment.

## 2011-03-12 LAB — BASIC METABOLIC PANEL
BUN: 21 mg/dL (ref 6–23)
Calcium: 8.8 mg/dL (ref 8.4–10.5)
Creatinine, Ser: 0.7 mg/dL (ref 0.4–1.5)
GFR: 110.98 mL/min (ref >60.00)
Glucose, Bld: 77 mg/dL (ref 70–99)
Potassium: 4.3 mEq/L (ref 3.5–5.1)

## 2011-03-12 LAB — LIPID PANEL
Cholesterol: 194 mg/dL (ref 0–200)
VLDL: 8.2 mg/dL (ref 0.0–40.0)

## 2011-03-12 LAB — HEPATIC FUNCTION PANEL
ALT: 29 U/L (ref 0–53)
AST: 25 U/L (ref 0–37)
Alkaline Phosphatase: 64 U/L (ref 39–117)
Bilirubin, Direct: 0.1 mg/dL (ref 0.0–0.3)
Total Bilirubin: 0.6 mg/dL (ref 0.3–1.2)
Total Protein: 6.2 g/dL (ref 6.0–8.3)

## 2011-03-15 NOTE — Progress Notes (Signed)
Quick Note:  Please report to patient. The recent labs are stable. Continue same medication and careful diet. LDL is still high. Watch diet more carefully. PSA not yet available? ______

## 2011-03-20 ENCOUNTER — Telehealth: Payer: Self-pay | Admitting: Cardiology

## 2011-03-20 NOTE — Telephone Encounter (Signed)
Lab results given to pt.  Copy left at the front desk per his request.

## 2011-03-20 NOTE — Telephone Encounter (Signed)
Pt would like lab test results and if you can't reach him on the listed above please call him on [phone] he also would like to pick up a copy or have it mailed to him

## 2011-04-29 ENCOUNTER — Telehealth: Payer: Self-pay | Admitting: Cardiology

## 2011-04-29 NOTE — Telephone Encounter (Signed)
We should check it now rather than waiting since it was close to high a year ago.

## 2011-04-29 NOTE — Telephone Encounter (Signed)
New msg Pt's wife called and wants to know psa results

## 2011-04-29 NOTE — Telephone Encounter (Signed)
Last PSA 3.31 in Feb 2012 and was not drawn.  Had checked with Mellody Dance in the lab and he said that [name] [name] was suppose to call and get patient to come back in for recheck.  Wife stated no one had called.  Wife wanted to know if ok to wait until next ov in August or recheck before.  Will forward to  Dr. Patty Sermons for review

## 2011-04-30 NOTE — Telephone Encounter (Signed)
Advised wife and scheduled

## 2011-05-06 ENCOUNTER — Other Ambulatory Visit: Payer: Self-pay | Admitting: *Deleted

## 2011-05-06 ENCOUNTER — Other Ambulatory Visit (INDEPENDENT_AMBULATORY_CARE_PROVIDER_SITE_OTHER): Payer: Medicare Other

## 2011-05-06 DIAGNOSIS — R351 Nocturia: Secondary | ICD-10-CM

## 2011-05-06 DIAGNOSIS — Z125 Encounter for screening for malignant neoplasm of prostate: Secondary | ICD-10-CM

## 2011-05-06 DIAGNOSIS — E7800 Pure hypercholesterolemia, unspecified: Secondary | ICD-10-CM

## 2011-05-06 DIAGNOSIS — N401 Enlarged prostate with lower urinary tract symptoms: Secondary | ICD-10-CM

## 2011-05-07 LAB — PSA: PSA: 5.87 ng/mL — ABNORMAL HIGH (ref 0.10–4.00)

## 2011-05-19 ENCOUNTER — Telehealth: Payer: Self-pay | Admitting: Cardiology

## 2011-05-19 NOTE — Telephone Encounter (Signed)
Advised wife of labs.  She will call back with urologist of [facility]

## 2011-05-19 NOTE — Telephone Encounter (Signed)
Pt had a blood test and wife calling for results

## 2011-06-09 NOTE — Telephone Encounter (Signed)
Left message to call back  

## 2011-06-17 NOTE — Progress Notes (Signed)
We could have him come in for a repeat PSA but he would have to understand that insurance would most likely not cover the repeat test and he would probably have to pay for the test himself.

## 2011-12-03 ENCOUNTER — Encounter: Payer: Self-pay | Admitting: Cardiology

## 2011-12-03 ENCOUNTER — Ambulatory Visit (INDEPENDENT_AMBULATORY_CARE_PROVIDER_SITE_OTHER): Payer: Medicare Other | Admitting: Cardiology

## 2011-12-03 VITALS — BP 140/70 | HR 54 | Resp 17 | Ht 71.0 in | Wt 174.4 lb

## 2011-12-03 DIAGNOSIS — I359 Nonrheumatic aortic valve disorder, unspecified: Secondary | ICD-10-CM

## 2011-12-03 DIAGNOSIS — Z954 Presence of other heart-valve replacement: Secondary | ICD-10-CM

## 2011-12-03 DIAGNOSIS — I350 Nonrheumatic aortic (valve) stenosis: Secondary | ICD-10-CM

## 2011-12-03 DIAGNOSIS — E7800 Pure hypercholesterolemia, unspecified: Secondary | ICD-10-CM

## 2011-12-03 DIAGNOSIS — R972 Elevated prostate specific antigen [PSA]: Secondary | ICD-10-CM

## 2011-12-03 DIAGNOSIS — Z952 Presence of prosthetic heart valve: Secondary | ICD-10-CM

## 2011-12-03 DIAGNOSIS — N401 Enlarged prostate with lower urinary tract symptoms: Secondary | ICD-10-CM

## 2011-12-03 NOTE — Patient Instructions (Addendum)
Your physician recommends that you continue on your current medications as directed. Please refer to the Current Medication list given to you today.  Your physician wants you to follow-up in: 6 months with fasting labs (lp/bmet/hfp)  You will receive a reminder letter in the mail two months in advance. If you don't receive a letter, please call our office to schedule the follow-up appointment.   Appointment with Dr Laverle Patter on February 10, 2012 at 10:00 am , fill out paper work you receive, take list of medications, photo ID, and insurance cards with you

## 2011-12-03 NOTE — Telephone Encounter (Signed)
Patient seen today, has appt with urologist in January

## 2011-12-03 NOTE — Assessment & Plan Note (Signed)
The patient has a past history of aortic valve replacement with a tissue valve.  He is not having any symptoms of congestive heart failure.  His legs feel a little weaker but it may be because he has less physically active.  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

## 2011-12-03 NOTE — Assessment & Plan Note (Signed)
The patient has a history of hypercholesterolemia.  He has not been willing to take any cholesterol-lowering medication.  He did not come fasting today.

## 2011-12-03 NOTE — Progress Notes (Signed)
[name] [name] Date of Birth:  1940-03-22 [facility] [address] [place], [place]  [address] [phone]         Fax   [phone]  History of Present Illness: This pleasant 71 year old gentleman is seen for a scheduled followup office visit. 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 He also has a history of hypercholesterolemia. He also has a history of BPH with nocturia and his PSA a year ago was 3.31, and a repeat PSA on 05/06/11 was elevated at 5.87.  He was advised at that time to see a urologist but has not done so.  Of note is the fact that he tells me that his father died of prostate cancer with bony metastases at age 64 which is about the patient's present age.   Current Outpatient Prescriptions  Medication Sig Dispense Refill  . amoxicillin (AMOXIL) 500 MG tablet Take 500 mg by mouth as needed. Re-valve replacement      . aspirin 325 MG tablet Take 325 mg by mouth daily.      Marland Kitchen CRANBERRY PO Take by mouth.      . Ibuprofen 200 MG CAPS Take by mouth. As needed      . latanoprost (XALATAN) 0.005 % ophthalmic solution 1 drop at bedtime.      . Omega-3 Fatty Acids (FISH OIL PO) Take by mouth daily.      . Saw Palmetto, Serenoa repens, (SAW PALMETTO PO) Take by mouth daily.      . vitamin C (ASCORBIC ACID) 500 MG tablet Take 500 mg by mouth daily.        No Known Allergies  Patient Active Problem List  Diagnosis  . Aortic stenosis  . Sleep apnea  . Glaucoma  . Hypercholesterolemia  . BPH associated with nocturia    History  Smoking status  . Never Smoker   Smokeless tobacco  . Not on file    History  Alcohol Use     Family History  Problem Relation Age of Onset  . Heart disease Mother   . Cancer Father     Review of Systems: Constitutional: no fever chills diaphoresis or fatigue or change in weight.  Head and neck: no hearing loss, no epistaxis, no photophobia or  visual disturbance. Respiratory: No cough, shortness of breath or wheezing. Cardiovascular: No chest pain peripheral edema, palpitations. Gastrointestinal: No abdominal distention, no abdominal pain, no change in bowel habits hematochezia or melena. Genitourinary: No dysuria, no frequency, no urgency, no nocturia. Musculoskeletal:No arthralgias, no back pain, no gait disturbance or myalgias. Neurological: No dizziness, no headaches, no numbness, no seizures, no syncope, no weakness, no tremors. Hematologic: No lymphadenopathy, no easy bruising. Psychiatric: No confusion, no hallucinations, no sleep disturbance.    Physical Exam: Filed Vitals:   12/03/11 1057  BP: 140/70  Pulse: 54  Resp: 17   general appearance reveals a well-developed well-nourished gentleman in no distress.The head and neck exam reveals pupils equal and reactive.  Extraocular movements are full.  There is no scleral icterus.  The mouth and pharynx are normal.  The neck is supple.  The carotids reveal no bruits.  The jugular venous pressure is normal.  The  thyroid is not enlarged.  There is no lymphadenopathy.  The chest is clear to percussion and auscultation.  There are no rales or rhonchi.  Expansion of the chest is symmetrical.  The precordium is quiet.  The first heart sound is normal.  The second heart sound is physiologically split.  There is grade 2/6 systolic flow murmur across his prosthetic aortic valve.  There is no diastolic murmur.  There is no abnormal lift or heave.  The abdomen is soft and nontender.  The bowel sounds are normal.  The liver and spleen are not enlarged.  There are no abdominal masses.  There are no abdominal bruits.  Extremities reveal good pedal pulses.  There is no phlebitis or edema.  There is no cyanosis or clubbing.  Strength is normal and symmetrical in all extremities.  There is no lateralizing weakness.  There are no sensory deficits.  The skin is warm and dry.  There is no  rash.     Assessment / Plan: Referral to Dr. Heloise Purpura concerning possible prostate cancer.  We did not repeat any lab work today. Recheck in 6 months for followup office visit lipid panel hepatic function panel and basal metabolic panel fasting.

## 2011-12-03 NOTE — Assessment & Plan Note (Signed)
The patient has had a rapid rise in his PSA.  It was 5.87 in 05/12/2011.  His father died of prostate cancer.  I have urged him to follow through with a neurology evaluation and we will refer him to Dr. Heloise Purpura.

## 2011-12-15 ENCOUNTER — Telehealth: Payer: Self-pay | Admitting: Cardiology

## 2011-12-15 DIAGNOSIS — Z792 Long term (current) use of antibiotics: Secondary | ICD-10-CM

## 2011-12-15 MED ORDER — AMOXICILLIN 500 MG PO TABS: ORAL_TABLET | ORAL | Status: DC

## 2011-12-15 NOTE — Telephone Encounter (Signed)
Pt going to the dentist for a cleaning on Friday, and may have a procedure while there, does he need abx? [facility] in [place], pls call [phone]

## 2011-12-15 NOTE — Telephone Encounter (Signed)
Spoke with wife and she requested larger quantity be called since patient had not been to dentist in years.  Sent to pharmacy

## 2011-12-15 NOTE — Telephone Encounter (Signed)
Amoxicillin 500 mg 4 tablets one hour before dental work. He can have 20 tablets, that will be enough for 5 visits.  Refill x2

## 2012-10-12 ENCOUNTER — Encounter: Payer: Self-pay | Admitting: Cardiology

## 2012-10-12 ENCOUNTER — Ambulatory Visit (INDEPENDENT_AMBULATORY_CARE_PROVIDER_SITE_OTHER): Payer: Medicare Other | Admitting: Cardiology

## 2012-10-12 VITALS — BP 138/60 | HR 60 | Ht 72.0 in | Wt 171.0 lb

## 2012-10-12 DIAGNOSIS — N401 Enlarged prostate with lower urinary tract symptoms: Secondary | ICD-10-CM

## 2012-10-12 DIAGNOSIS — N138 Other obstructive and reflux uropathy: Secondary | ICD-10-CM

## 2012-10-12 DIAGNOSIS — Z954 Presence of other heart-valve replacement: Secondary | ICD-10-CM

## 2012-10-12 DIAGNOSIS — N400 Enlarged prostate without lower urinary tract symptoms: Secondary | ICD-10-CM

## 2012-10-12 DIAGNOSIS — E7800 Pure hypercholesterolemia, unspecified: Secondary | ICD-10-CM

## 2012-10-12 DIAGNOSIS — Z952 Presence of prosthetic heart valve: Secondary | ICD-10-CM

## 2012-10-12 NOTE — Assessment & Plan Note (Signed)
His symptoms of BPH are stable and is PSA is down to 3.2 by eating pumpkin seeds on his salad daily.

## 2012-10-12 NOTE — Assessment & Plan Note (Signed)
The patient has a history of hypercholesterolemia.  This is followed by his PCP and his recent LDL was 109 on 08/10/12.

## 2012-10-12 NOTE — Patient Instructions (Addendum)
You need to go for a chest xray at the [facility] building across from [facility] soon  Your physician has requested that you have an echocardiogram. Echocardiography is a painless test that uses sound waves to create images of your heart. It provides your doctor with information about the size and shape of your heart and how well your heart's chambers and valves are working. This procedure takes approximately one hour. There are no restrictions for this procedure.   Your physician wants you to follow-up in: 1 year You will receive a reminder letter in the mail two months in advance. If you don't receive a letter, please call our office to schedule the follow-up appointment.   DECREASE YOUR ASPIRIN TO 81 MG DAILY

## 2012-10-12 NOTE — Progress Notes (Signed)
[name] [name] Date of Birth:  1940/07/30 [facility] [address] [place], [place]  [address] [phone]         Fax   [phone]  History of Present Illness: This pleasant 72 year old gentleman is seen for a scheduled followup office visit. 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  His surgery was by Dr. Tyrone Sage in 02/09/03.  He has a head wound is by a science bovine pericardial aortic tissue graft size 25 mm.  At the time of his cardiac catheterization he did not have any significant coronary disease and did not require bypass surgery. He also has a history of hypercholesterolemia. He also has a history of BPH with nocturia and his PSA a year ago was 3.31, and a repeat PSA on 05/06/11 was elevated at 5.87.  He was advised at that time to see a urologist but did not see one.  He saw his PCP.  The patient started eating pumpkinseed's and this has brought his PSA down to its current level of 3.2.  Of note is the fact that he tells me that his father died of prostate cancer with bony metastases at age 55.    Current Outpatient Prescriptions  Medication Sig Dispense Refill  . amoxicillin (AMOXIL) 500 MG tablet 4 tablets 1 hour prior to procedure  20 tablet  1  . aspirin 81 MG tablet Take 81 mg by mouth daily.      Marland Kitchen CRANBERRY PO Take by mouth.      . Ibuprofen 200 MG CAPS Take by mouth. As needed      . latanoprost (XALATAN) 0.005 % ophthalmic solution 1 drop at bedtime.      . vitamin C (ASCORBIC ACID) 500 MG tablet Take 500 mg by mouth daily.       No current facility-administered medications for this visit.    No Known Allergies  Patient Active Problem List   Diagnosis Date Noted  . H/O aortic valve replacement with tissue graft 10/12/2012  . Hypercholesterolemia 03/11/2011  . BPH associated with nocturia 03/11/2011  . Aortic stenosis   . Sleep apnea   . Glaucoma     History  Smoking  status  . Never Smoker   Smokeless tobacco  . Not on file    History  Alcohol Use     Family History  Problem Relation Age of Onset  . Heart disease Mother   . Cancer Father     Review of Systems: Constitutional: no fever chills diaphoresis or fatigue or change in weight.  Head and neck: no hearing loss, no epistaxis, no photophobia or visual disturbance. Respiratory: No cough, shortness of breath or wheezing. Cardiovascular: No chest pain peripheral edema, palpitations. Gastrointestinal: No abdominal distention, no abdominal pain, no change in bowel habits hematochezia or melena. Genitourinary: No dysuria, no frequency, no urgency, no nocturia. Musculoskeletal:No arthralgias, no back pain, no gait disturbance or myalgias. Neurological: No dizziness, no headaches, no numbness, no seizures, no syncope, no weakness, no tremors. Hematologic: No lymphadenopathy, no easy bruising. Psychiatric: No confusion, no hallucinations, no sleep disturbance.    Physical Exam: Filed Vitals:   10/12/12 1443  BP: 138/60  Pulse: 60   general appearance reveals a well-developed well-nourished gentleman in no distress.The head and neck exam reveals pupils equal and reactive.  Extraocular movements are full.  There is no scleral icterus.  The mouth and pharynx are normal.  The  neck is supple.  The carotids reveal no bruits.  The jugular venous pressure is normal.  The  thyroid is not enlarged.  There is no lymphadenopathy.  The chest is clear to percussion and auscultation.  There are no rales or rhonchi.  Expansion of the chest is symmetrical.  The precordium is quiet.  The first heart sound is normal.  The second heart sound is physiologically split.  There is grade 2/6 systolic flow murmur across his prosthetic aortic valve.  There is no diastolic murmur.  There is no abnormal lift or heave.  The abdomen is soft and nontender.  The bowel sounds are normal.  The liver and spleen are not enlarged.   There are no abdominal masses.  There are no abdominal bruits.  Extremities reveal good pedal pulses.  There is no phlebitis or edema.  There is no cyanosis or clubbing.  Strength is normal and symmetrical in all extremities.  There is no lateralizing weakness.  There are no sensory deficits.  The skin is warm and dry.  There is no rash.  EKG shows sinus bradycardia with first degree AV block and nonspecific T-wave flattening and is essentially unchanged since 03/11/11 except that PVCs are now absent   Assessment / Plan:  We are getting a chest x-ray to look at heart size. The patient will return for a two-dimensional echocardiogram to followup on his bioprosthetic aortic valve replacement dated 2005 Recheck in one year intervals for office visit and EKG, or sooner when necessary We reminded him again about the need for SBE prophylaxis at the time of dental work etc.

## 2012-10-12 NOTE — Assessment & Plan Note (Signed)
The patient has not been having any chest pain or angina or shortness of breath.  He has been taking a fall 325 mg aspirin daily and we will cut this down to just 81 mg because of excessive skin bruising.  He has not had an echocardiogram since his open-heart surgery.  We will update his echo.  He still has a prominent systolic flow murmur across the aortic valve on auscultation

## 2012-11-03 ENCOUNTER — Other Ambulatory Visit (HOSPITAL_COMMUNITY): Payer: No Typology Code available for payment source

## 2012-11-14 ENCOUNTER — Ambulatory Visit
Admission: RE | Admit: 2012-11-14 | Discharge: 2012-11-14 | Disposition: A | Discharge status: Home / Self Care | Payer: PRIVATE HEALTH INSURANCE | Source: Ambulatory Visit | Attending: Cardiology | Admitting: Cardiology

## 2012-11-14 ENCOUNTER — Encounter (INDEPENDENT_AMBULATORY_CARE_PROVIDER_SITE_OTHER): Payer: Self-pay

## 2012-11-14 ENCOUNTER — Ambulatory Visit (HOSPITAL_COMMUNITY): Payer: PRIVATE HEALTH INSURANCE | Attending: Cardiology | Admitting: Cardiology

## 2012-11-14 DIAGNOSIS — Z952 Presence of prosthetic heart valve: Secondary | ICD-10-CM | POA: Insufficient documentation

## 2012-11-14 DIAGNOSIS — R011 Cardiac murmur, unspecified: Secondary | ICD-10-CM | POA: Insufficient documentation

## 2012-11-14 DIAGNOSIS — I359 Nonrheumatic aortic valve disorder, unspecified: Secondary | ICD-10-CM | POA: Insufficient documentation

## 2012-11-14 DIAGNOSIS — Z954 Presence of other heart-valve replacement: Secondary | ICD-10-CM

## 2012-11-14 DIAGNOSIS — I079 Rheumatic tricuspid valve disease, unspecified: Secondary | ICD-10-CM | POA: Insufficient documentation

## 2012-11-14 DIAGNOSIS — E785 Hyperlipidemia, unspecified: Secondary | ICD-10-CM | POA: Insufficient documentation

## 2012-11-14 DIAGNOSIS — I379 Nonrheumatic pulmonary valve disorder, unspecified: Secondary | ICD-10-CM | POA: Insufficient documentation

## 2012-11-14 NOTE — Progress Notes (Signed)
Echo performed. 

## 2012-11-16 ENCOUNTER — Telehealth: Payer: Self-pay | Admitting: Cardiology

## 2012-11-16 MED ORDER — LISINOPRIL 5 MG PO TABS: 5.0000 mg | ORAL_TABLET | Freq: Every day | ORAL | Status: DC

## 2012-11-16 NOTE — Telephone Encounter (Signed)
Message copied by Burnell Blanks on Wed Nov 16, 2012 10:54 AM ------      Message from: Cassell Clement      Created: Mon Nov 14, 2012 12:43 PM       Chest x-ray is normal.  Please report ------

## 2012-11-16 NOTE — Telephone Encounter (Signed)
New message    Someone called pt to leave test results---Pls call back at your convenience.

## 2012-11-16 NOTE — Telephone Encounter (Signed)
Message copied by Burnell Blanks on Wed Nov 16, 2012 10:54 AM ------      Message from: Cassell Clement      Created: Mon Nov 14, 2012 12:52 PM       Please report.  The echocardiogram shows good left ventricular systolic function.  The prosthetic aortic valve showed that there was some regurgitation present of a moderate degree.  In order to prevent progression of this we will add a very low dose of lisinopril 5 mg one daily.  Recheck in 4-6 weeks for followup office visit and basal metabolic panel. ------

## 2012-11-16 NOTE — Telephone Encounter (Signed)
Advised wife, verbalized understanding. Sent Rx to pharmacy and scheduled follow up ov

## 2012-11-23 ENCOUNTER — Telehealth: Payer: Self-pay | Admitting: Cardiology

## 2012-11-23 NOTE — Telephone Encounter (Signed)
New Problem  Pt wife is calling - She states that his Bp is dropping and heart rhythm is really off/// Her question- Is he bleeding from his Aortic valve or is it bleeding into the heart itself . Pt's wife has additional questions that she would like to discuss with the nurse personally for a thorough understanding..Please call.

## 2012-11-23 NOTE — Telephone Encounter (Signed)
Wife called with several questions.  States he had some CP on Mon for about 9 hrs.  Did not want to go to ER.  Pain was center chest, no nausea or vomiting, did not radiated into arm. BP was 160/52;132/45;132/53. When got up on Tues was fine.  Has not had any pain since.  Other questions was she thinks his BP is dropping.  Only started Lisinopril today.  Today BP was 149/51, HR 56 before he took Lisinopril.she also is concerned that valve "is bleeding".from Echo results. Tried to reassure her. Spoke w/Dr. Patty Sermons who advises that he stay on Lisinopril and monitor his BP and HR. He was not concerned with his pain that he had on Mon.  Pt has an app w/Dr. Patty Sermons on Dec. 10. Reassured wife again and to call if BP drops.  She verbalizes understanding and will call if has further concerns.

## 2012-12-21 ENCOUNTER — Ambulatory Visit: Payer: No Typology Code available for payment source | Admitting: Cardiology

## 2012-12-23 ENCOUNTER — Ambulatory Visit (INDEPENDENT_AMBULATORY_CARE_PROVIDER_SITE_OTHER): Payer: PRIVATE HEALTH INSURANCE | Admitting: Cardiology

## 2012-12-23 ENCOUNTER — Encounter: Payer: Self-pay | Admitting: Cardiology

## 2012-12-23 VITALS — BP 135/43 | HR 51 | Ht 72.0 in | Wt 172.2 lb

## 2012-12-23 DIAGNOSIS — I4949 Other premature depolarization: Secondary | ICD-10-CM

## 2012-12-23 DIAGNOSIS — I359 Nonrheumatic aortic valve disorder, unspecified: Secondary | ICD-10-CM

## 2012-12-23 DIAGNOSIS — I10 Essential (primary) hypertension: Secondary | ICD-10-CM | POA: Insufficient documentation

## 2012-12-23 DIAGNOSIS — Z79899 Other long term (current) drug therapy: Secondary | ICD-10-CM

## 2012-12-23 DIAGNOSIS — Z954 Presence of other heart-valve replacement: Secondary | ICD-10-CM

## 2012-12-23 DIAGNOSIS — I352 Nonrheumatic aortic (valve) stenosis with insufficiency: Secondary | ICD-10-CM | POA: Insufficient documentation

## 2012-12-23 DIAGNOSIS — I493 Ventricular premature depolarization: Secondary | ICD-10-CM | POA: Insufficient documentation

## 2012-12-23 DIAGNOSIS — I491 Atrial premature depolarization: Secondary | ICD-10-CM

## 2012-12-23 DIAGNOSIS — Z952 Presence of prosthetic heart valve: Secondary | ICD-10-CM

## 2012-12-23 DIAGNOSIS — G4730 Sleep apnea, unspecified: Secondary | ICD-10-CM

## 2012-12-23 LAB — BASIC METABOLIC PANEL
BUN: 23 mg/dL (ref 6–23)
Calcium: 8.9 mg/dL (ref 8.4–10.5)
Creatinine, Ser: 0.7 mg/dL (ref 0.4–1.5)
GFR: 115.82 mL/min (ref >60.00)

## 2012-12-23 MED ORDER — LISINOPRIL 10 MG PO TABS: 10.0000 mg | ORAL_TABLET | Freq: Every day | ORAL | Status: DC

## 2012-12-23 NOTE — Assessment & Plan Note (Signed)
The patient has mild systolic hypertension with a wide pulse pressure which is attributable to his aortic insufficiency.  He has been on lisinopril 5 mg daily and we will increase the dose to 10 mg daily.  We're checking a basal metabolic panel today.

## 2012-12-23 NOTE — Progress Notes (Signed)
[name] [name] Date of Birth:  05-16-1940 [address] [place], [place]  [address] [phone]         Fax   [phone]  History of Present Illness: This pleasant 72 year old gentleman is seen for a scheduled followup office visit. 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  His surgery was by Dr. Tyrone Sage in 02/09/03.   At the time of his cardiac catheterization he did not have any significant coronary disease and did not require bypass surgery. He also has a history of hypercholesterolemia. He also has a history of BPH with nocturia and his PSA a year ago was 3.31, and a repeat PSA on 05/06/11 was elevated at 5.87.  He was advised at that time to see a urologist but did not see one.  He saw his PCP.  The patient started eating pumpkinseed's and this has brought his PSA down to its current level of 3.2.  Of note is the fact that he tells me that his father died of prostate cancer with bony metastases at age 27.  After his last visit in October 2014 the patient underwent an chest x-ray which showed heart size at upper normal and lungs were clear.  On 11/14/12 patient had an echocardiogram showing an ejection fraction of 60-65% with grade 2 diastolic dysfunction.  His prosthetic valve showed moderate aortic stenosis with peak gradient 55 and a mean gradient of 22.  He also has moderate aortic insufficiency. The patient has sleep apnea according to his wife.  He saw Dr. Jetty Duhamel in 2008.  His wife states that he had an unsatisfactory sleep study at that time because he never fell asleep during the study.   Current Outpatient Prescriptions  Medication Sig Dispense Refill  . amoxicillin (AMOXIL) 500 MG tablet 4 tablets 1 hour prior to procedure  20 tablet  1  . aspirin 81 MG tablet Take 81 mg by mouth daily.      . brimonidine (ALPHAGAN) 0.15 % ophthalmic solution       . Ibuprofen 200 MG CAPS Take by mouth. As  needed      . latanoprost (XALATAN) 0.005 % ophthalmic solution 1 drop at bedtime.      Marland Kitchen lisinopril (PRINIVIL,ZESTRIL) 10 MG tablet Take 1 tablet (10 mg total) by mouth daily.  90 tablet  3  . vitamin C (ASCORBIC ACID) 500 MG tablet Take 500 mg by mouth daily.       No current facility-administered medications for this visit.    No Known Allergies  Patient Active Problem List   Diagnosis Date Noted  . Aortic insufficiency and aortic stenosis 12/23/2012  . PVC's (premature ventricular contractions) 12/23/2012  . PAC (premature atrial contraction) 12/23/2012  . H/O aortic valve replacement with tissue graft 10/12/2012  . Hypercholesterolemia 03/11/2011  . BPH associated with nocturia 03/11/2011  . Aortic stenosis   . Sleep apnea   . Glaucoma     History  Smoking status  . Never Smoker   Smokeless tobacco  . Not on file    History  Alcohol Use     Family History  Problem Relation Age of Onset  . Heart disease Mother   . Cancer Father     Review of Systems: Constitutional: no fever chills diaphoresis or fatigue or change in weight.  Head and neck: no hearing loss, no epistaxis, no photophobia or visual disturbance. Respiratory: No cough,  shortness of breath or wheezing. Cardiovascular: No chest pain peripheral edema, palpitations. Gastrointestinal: No abdominal distention, no abdominal pain, no change in bowel habits hematochezia or melena. Genitourinary: No dysuria, no frequency, no urgency, no nocturia. Musculoskeletal:No arthralgias, no back pain, no gait disturbance or myalgias. Neurological: No dizziness, no headaches, no numbness, no seizures, no syncope, no weakness, no tremors. Hematologic: No lymphadenopathy, no easy bruising. Psychiatric: No confusion, no hallucinations, no sleep disturbance.    Physical Exam: Filed Vitals:   12/23/12 1007  BP: 135/43  Pulse: 51   general appearance reveals a well-developed well-nourished gentleman in no distress.The  head and neck exam reveals pupils equal and reactive.  Extraocular movements are full.  There is no scleral icterus.  The mouth and pharynx are normal.  The neck is supple.  The carotids reveal no bruits.  The jugular venous pressure is normal.  The  thyroid is not enlarged.  There is no lymphadenopathy.  The chest is clear to percussion and auscultation.  There are no rales or rhonchi.  Expansion of the chest is symmetrical.  The precordium is quiet.  The first heart sound is normal.  The second heart sound is physiologically split.  There is grade 2/6 systolic flow murmur across his prosthetic aortic valve.  There is no diastolic murmur.  There is no abnormal lift or heave.  The abdomen is soft and nontender.  The bowel sounds are normal.  The liver and spleen are not enlarged.  There are no abdominal masses.  There are no abdominal bruits.  Extremities reveal good pedal pulses.  There is no phlebitis or edema.  There is no cyanosis or clubbing.  Strength is normal and symmetrical in all extremities.  There is no lateralizing weakness.  There are no sensory deficits.  The skin is warm and dry.  There is no rash.  EKG shows sinus bradycardia with first degree AV block and with occasional PVC and PAC.  There is anterolateral T-wave abnormality unchanged since 10/12/12   Assessment / Plan:  Continue same medication except increase lisinopril to 10 mg daily. Referral to Dr. Maple Hudson for evaluation of symptoms of sleep apnea We are checking a basal metabolic panel today because of the recent addition of ACE inhibitor Recheck in 3 months for followup office visit.

## 2012-12-23 NOTE — Assessment & Plan Note (Signed)
The patient has had symptoms of sleep apnea for at least 7 years according to his wife.  He has never had a CPAP machine prescribed.  He does have arrhythmia with frequent PVC and PAC.  We will refer patient to Dr. Jetty Duhamel for an update on his sleep apnea status and for consideration of CPAP machine

## 2012-12-23 NOTE — Patient Instructions (Signed)
Will obtain labs today and call you with the results (bmet)  INCREASE YOUR LISINOPRIL TO 10 MG DAILY  Would like for you to see Dr Maple Hudson for sleep apnea  Your physician recommends that you schedule a follow-up appointment in: 3 month ov

## 2012-12-23 NOTE — Assessment & Plan Note (Signed)
The patient is not having any symptoms of congestive heart failure.  No chest pain or angina.  Because of his aortic insufficiency he has been placed on low-dose lisinopril 5 mg daily and he is not having any side effects so far.

## 2012-12-24 NOTE — Progress Notes (Signed)
Quick Note:  Please report to patient. The recent labs are stable. Continue same medication and careful diet.BS normal. Kidney function normal. ______

## 2012-12-27 ENCOUNTER — Telehealth: Payer: Self-pay | Admitting: Cardiology

## 2012-12-27 NOTE — Telephone Encounter (Signed)
Advised of lab results 

## 2012-12-27 NOTE — Telephone Encounter (Signed)
Message copied by Burnell Blanks on Tue Dec 27, 2012 11:11 AM ------      Message from: Cassell Clement      Created: Sat Dec 24, 2012  6:17 PM       Please report to patient.  The recent labs are stable. Continue same medication and careful diet.BS normal. Kidney function normal. ------

## 2012-12-27 NOTE — Telephone Encounter (Signed)
Follow Up  Pt returned call for results//Sr

## 2013-01-31 ENCOUNTER — Ambulatory Visit (INDEPENDENT_AMBULATORY_CARE_PROVIDER_SITE_OTHER): Payer: Medicare Other | Admitting: Internal Medicine

## 2013-01-31 ENCOUNTER — Encounter: Payer: Self-pay | Admitting: Internal Medicine

## 2013-01-31 VITALS — BP 142/72 | HR 62 | Ht 72.0 in | Wt 170.0 lb

## 2013-01-31 DIAGNOSIS — G4710 Hypersomnia, unspecified: Secondary | ICD-10-CM

## 2013-01-31 DIAGNOSIS — I350 Nonrheumatic aortic (valve) stenosis: Secondary | ICD-10-CM

## 2013-01-31 DIAGNOSIS — G4733 Obstructive sleep apnea (adult) (pediatric): Secondary | ICD-10-CM

## 2013-01-31 DIAGNOSIS — G4730 Sleep apnea, unspecified: Secondary | ICD-10-CM

## 2013-01-31 DIAGNOSIS — I359 Nonrheumatic aortic valve disorder, unspecified: Secondary | ICD-10-CM

## 2013-01-31 NOTE — Patient Instructions (Signed)
Order- schedule split protocol NPSG   Dx OSA    He MAY need to use a recliner  Please call as needed

## 2013-01-31 NOTE — Progress Notes (Signed)
Subjective:    Patient ID: [name] [name], male    DOB: [birth year], 73 y.o.   MRN: [id number]  HPI 01/31/13- 27 yoM never smoker referred by Dr. Mare Ferrari for OSA. Epworth Score: 21. Previously followed here with last visit in 2008 for obstructive sleep apnea and rhinitis Wife is here. They report he is snoring he lies on his back or sits in a recliner. Wife thinks he is worse in the last few months. Admits daytime sleepiness and takes naps. Little caffeine. Sleep latency is very short, waking once or twice during the night before up at 5 AM. Medical history of hypertension and aortic valve replacement, tonsillectomy, glaucoma. He denies lung disease. Epworth sleepiness score 01/31/13---21/24 NPSG around 03/28/06- mild obstructive sleep apnea, AHI 14.2 per hour with desaturation to 85%. He was going to try sleeping off flat of his back and using a nasal steroid spray to reduce rhinitis. He was lost to followup.  Prior to Admission medications   Medication Sig Start Date End Date Taking? Authorizing Provider  amoxicillin (AMOXIL) 500 MG tablet 4 tablets 1 hour prior to procedure 12/15/11  Yes Darlin Coco, MD  aspirin 81 MG tablet Take 81 mg by mouth daily.   Yes Historical Provider, MD  brimonidine (ALPHAGAN) 0.15 % ophthalmic solution Place 1 drop into both eyes daily.  12/22/12  Yes Historical Provider, MD  Ibuprofen 200 MG CAPS Take by mouth. As needed   Yes Historical Provider, MD  latanoprost (XALATAN) 0.005 % ophthalmic solution 1 drop at bedtime.   Yes Historical Provider, MD  lisinopril (PRINIVIL,ZESTRIL) 10 MG tablet Take 1 tablet (10 mg total) by mouth daily. 12/23/12  Yes Darlin Coco, MD  vitamin C (ASCORBIC ACID) 500 MG tablet Take 500 mg by mouth daily.   Yes Historical Provider, MD   Past Medical History  Diagnosis Date  . Aortic stenosis   . Hyperlipidemia   . Heart murmur   . Sleep apnea     mild to moderate  . Glaucoma     low tension   Past Surgical History   Procedure Laterality Date  . Cardiac catheterization  02/08/2003    severe aortic stenosis,normal left ventricular function  . 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 Inguinal hernia repair  04/2003   Family History  Problem Relation Age of Onset  . Heart disease Mother   . Cancer Father    History   Social History  . Marital Status: Married    Spouse Name: N/A    Number of Children: N/A  . Years of Education: N/A   Occupational History  . Not on file.   Social History Main Topics  . Smoking status: Never Smoker   . Smokeless tobacco: Not on file  . Alcohol Use:   . Drug Use:   . Sexual Activity:    Other Topics Concern  . Not on file   Social History Narrative  . No narrative on file   Review of Systems  Constitutional: Negative for fever, chills, diaphoresis, activity change, appetite change, fatigue and unexpected weight change.  HENT: Negative for congestion, dental problem, ear discharge, ear pain, facial swelling, hearing loss, mouth sores, nosebleeds, postnasal drip, rhinorrhea, sinus pressure, sneezing, sore throat, tinnitus, trouble swallowing and voice change.   Eyes: Negative for photophobia, discharge, itching and visual disturbance.  Respiratory: Positive for apnea. Negative for cough, choking, chest tightness, shortness of breath, wheezing and stridor.  Cardiovascular: Negative for chest pain, palpitations and leg swelling.  Gastrointestinal: Negative for nausea, vomiting, abdominal pain, constipation, blood in stool and abdominal distention.  Genitourinary: Negative for dysuria, urgency, frequency, hematuria, flank pain, decreased urine volume and difficulty urinating.  Musculoskeletal: Negative for arthralgias, back pain, gait problem, joint swelling, myalgias, neck pain and neck stiffness.  Skin: Negative for color change, pallor and rash.  Neurological: Negative for dizziness, tremors, seizures, syncope, speech  difficulty, weakness, light-headedness, numbness and headaches.  Hematological: Negative for adenopathy. Does not bruise/bleed easily.  Psychiatric/Behavioral: Negative for confusion, sleep disturbance and agitation. The patient is not nervous/anxious.       Objective:   Physical Exam OBJ- Physical Exam General- Alert, Oriented, Affect-appropriate, Distress- none acute, tall, trim Skin- rash-none, lesions- none, excoriation- none Lymphadenopathy- none Head- atraumatic            Eyes- Gross vision intact, PERRLA, conjunctivae and secretions clear            Ears- +hearing aid            Nose- Clear, no-Septal dev, mucus, polyps, erosion, perforation             Throat- Mallampati II-III , mucosa clear , drainage- none, tonsils- atrophic, + own teeth Neck- flexible , trachea midline, no stridor , thyroid nl, carotid no bruit Chest - symmetrical excursion , unlabored           Heart/CV- RRR , 2/6 systolic murmur AS, no gallop  , no rub, nl s1 s2                           - JVD- none , edema- none, stasis changes- none, varices- none           Lung- clear to P&A, wheeze- none, cough- none , dullness-none, rub- none           Chest wall-  Abd- tender-no, distended-no, bowel sounds-present, HSM- no Br/ Gen/ Rectal- Not done, not indicated Extrem- cyanosis- none, clubbing, none, atrophy- none, strength- nl Neuro- grossly intact to observation    Assessment & Plan:

## 2013-02-26 NOTE — Assessment & Plan Note (Signed)
Mild obstructive sleep apnea original testing 7 years ago. Plan-split protocol sleep study to update data. He may need to sleep in a recliner

## 2013-02-26 NOTE — Assessment & Plan Note (Signed)
Murmur noted on exam for documentation

## 2013-03-01 ENCOUNTER — Encounter (HOSPITAL_BASED_OUTPATIENT_CLINIC_OR_DEPARTMENT_OTHER): Payer: Medicare Other

## 2013-03-14 ENCOUNTER — Ambulatory Visit: Payer: Medicare Other | Admitting: Internal Medicine

## 2013-03-24 ENCOUNTER — Ambulatory Visit (INDEPENDENT_AMBULATORY_CARE_PROVIDER_SITE_OTHER): Payer: Medicare Other | Admitting: Cardiology

## 2013-03-24 ENCOUNTER — Encounter: Payer: Self-pay | Admitting: Cardiology

## 2013-03-24 VITALS — BP 135/56 | HR 52 | Ht 72.0 in | Wt 167.0 lb

## 2013-03-24 DIAGNOSIS — Z952 Presence of prosthetic heart valve: Secondary | ICD-10-CM

## 2013-03-24 DIAGNOSIS — I10 Essential (primary) hypertension: Secondary | ICD-10-CM

## 2013-03-24 DIAGNOSIS — I4949 Other premature depolarization: Secondary | ICD-10-CM

## 2013-03-24 DIAGNOSIS — I493 Ventricular premature depolarization: Secondary | ICD-10-CM

## 2013-03-24 DIAGNOSIS — E7800 Pure hypercholesterolemia, unspecified: Secondary | ICD-10-CM

## 2013-03-24 DIAGNOSIS — Z954 Presence of other heart-valve replacement: Secondary | ICD-10-CM

## 2013-03-24 NOTE — Progress Notes (Signed)
[name] [name] Date of Birth:  10-29-1940 [address] [place], [place]  [address] [phone]         Fax   [phone]  History of Present Illness: This pleasant 73 year old gentleman is seen for a scheduled followup office visit. 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  His surgery was by Dr. Servando Snare in 02/09/03.   At the time of his cardiac catheterization he did not have any significant coronary disease and did not require bypass surgery. He also has a history of hypercholesterolemia. He also has a history of BPH with nocturia and his PSA a year ago was 3.31, and a repeat PSA on 05/06/11 was elevated at 5.87.  He was advised at that time to see a urologist but did not see one.  He saw his PCP.  The patient started eating pumpkinseed's and this has brought his PSA down to its current level of 3.2.  Of note is the fact that he tells me that his father died of prostate cancer with bony metastases at age 24.  After his last visit in October 2014 the patient underwent an chest x-ray which showed heart size at upper normal and lungs were clear.  On 11/14/12 patient had an echocardiogram showing an ejection fraction of 60-65% with grade 2 diastolic dysfunction.  His prosthetic valve showed moderate aortic stenosis with peak gradient 55 and a mean gradient of 22.  He also has moderate aortic insufficiency. The patient recently had an updated evaluation for sleep apnea with Dr. Annamaria Boots.  He did not proceed with getting a CPAP machine however because his wife was concerned about how much or how little insurance would pay.  Current Outpatient Prescriptions  Medication Sig Dispense Refill  . amoxicillin (AMOXIL) 500 MG tablet 4 tablets 1 hour prior to procedure  20 tablet  1  . aspirin 81 MG tablet Take 81 mg by mouth daily.      . brimonidine (ALPHAGAN) 0.15 % ophthalmic solution Place 1 drop into both eyes daily.       .  Ibuprofen 200 MG CAPS Take by mouth. As needed      . latanoprost (XALATAN) 0.005 % ophthalmic solution 1 drop at bedtime.      Marland Kitchen lisinopril (PRINIVIL,ZESTRIL) 10 MG tablet Take 1 tablet (10 mg total) by mouth daily.  90 tablet  3  . vitamin C (ASCORBIC ACID) 500 MG tablet Take 500 mg by mouth daily.       No current facility-administered medications for this visit.    No Known Allergies  Patient Active Problem List   Diagnosis Date Noted  . Aortic insufficiency and aortic stenosis 12/23/2012  . PVC's (premature ventricular contractions) 12/23/2012  . PAC (premature atrial contraction) 12/23/2012  . Systolic hypertension 41/32/4401  . H/O aortic valve replacement with tissue graft 10/12/2012  . Hypercholesterolemia 03/11/2011  . BPH associated with nocturia 03/11/2011  . Aortic stenosis   . Hypersomnia with sleep apnea, unspecified   . Glaucoma     History  Smoking status  . Never Smoker   Smokeless tobacco  . Not on file    History  Alcohol Use     Family History  Problem Relation Age of Onset  . Heart disease Mother   . Cancer Father     Review of Systems: Constitutional: no fever chills diaphoresis or fatigue or change in weight.  Head and neck:  no hearing loss, no epistaxis, no photophobia or visual disturbance. Respiratory: No cough, shortness of breath or wheezing. Cardiovascular: No chest pain peripheral edema, palpitations. Gastrointestinal: No abdominal distention, no abdominal pain, no change in bowel habits hematochezia or melena. Genitourinary: No dysuria, no frequency, no urgency, no nocturia. Musculoskeletal:No arthralgias, no back pain, no gait disturbance or myalgias. Neurological: No dizziness, no headaches, no numbness, no seizures, no syncope, no weakness, no tremors. Hematologic: No lymphadenopathy, no easy bruising. Psychiatric: No confusion, no hallucinations, no sleep disturbance.    Physical Exam: Filed Vitals:   03/24/13 1038  BP:  135/56  Pulse: 52   general appearance reveals a well-developed well-nourished gentleman in no distress.The head and neck exam reveals pupils equal and reactive.  Extraocular movements are full.  There is no scleral icterus.  The mouth and pharynx are normal.  The neck is supple.  The carotids reveal no bruits.  The jugular venous pressure is normal.  The  thyroid is not enlarged.  There is no lymphadenopathy.  The chest is clear to percussion and auscultation.  There are no rales or rhonchi.  Expansion of the chest is symmetrical.  The precordium is quiet.  The first heart sound is normal.  The second heart sound is physiologically split.  There is grade 2/6 systolic flow murmur across his prosthetic aortic valve.  There is no diastolic murmur.  There is no abnormal lift or heave.  The abdomen is soft and nontender.  The bowel sounds are normal.  The liver and spleen are not enlarged.  There are no abdominal masses.  There are no abdominal bruits.  Extremities reveal good pedal pulses.  There is no phlebitis or edema.  There is no cyanosis or clubbing.  Strength is normal and symmetrical in all extremities.  There is no lateralizing weakness.  There are no sensory deficits.  The skin is warm and dry.  There is no rash.  EKG shows sinus bradycardia with first degree AV block and with occasional PVC and PAC.  There is anterolateral T-wave abnormality unchanged since 10/12/12   Assessment / Plan:  Continue on same medication.  Recheck in 6 months for office visit and EKG.  He is watching his diet better and his weight is down 5 pounds since last visit.

## 2013-03-24 NOTE — Assessment & Plan Note (Signed)
Blood pressure has been in the normal range since we placed him on lisinopril.  No chest pain or shortness of breath.  His chest x-ray of 11/15/12 showed normal heart size and no CHF

## 2013-03-24 NOTE — Patient Instructions (Signed)
Your physician recommends that you continue on your current medications as directed. Please refer to the Current Medication list given to you today.  Your physician wants you to follow-up in: 6 month ov/ekg You will receive a reminder letter in the mail two months in advance. If you don't receive a letter, please call our office to schedule the follow-up appointment.  

## 2013-03-24 NOTE — Assessment & Plan Note (Signed)
The patient is not having any exertional chest pain.  No symptoms of CHF.  He knows about the need for SBE prophylaxis for dental work etc. per

## 2013-07-17 ENCOUNTER — Telehealth: Payer: Self-pay | Admitting: Cardiology

## 2013-07-17 NOTE — Telephone Encounter (Signed)
Left message to call back  

## 2013-07-17 NOTE — Telephone Encounter (Signed)
Patient phoned complaining of blood pressure being up over the last couple of weeks 130's-170's/48-52. Would like for  Dr. Mare Ferrari to review when he is back in the office Wednesday, will forward.

## 2013-07-17 NOTE — Telephone Encounter (Signed)
New problem   Pt's bp is a little high and need to speak to nurse. Please call pt.

## 2013-07-17 NOTE — Telephone Encounter (Signed)
Would increase lisinopril to 20 mg daily.  Avoid salt. Keep track of home BP and let us know how he responds.

## 2013-07-21 NOTE — Telephone Encounter (Signed)
Advised patient and wife. She would prefer if patient takes Lisinopril 10 mg twice a day instead of 20 mg daily. Will continue to monitor and let us know how he is doing.

## 2013-08-06 ENCOUNTER — Other Ambulatory Visit: Payer: Self-pay

## 2013-08-06 MED ORDER — LISINOPRIL 10 MG PO TABS: 10.0000 mg | ORAL_TABLET | Freq: Two times a day (BID) | ORAL | Status: DC

## 2013-08-07 ENCOUNTER — Telehealth: Payer: Self-pay | Admitting: Cardiology

## 2013-08-07 MED ORDER — LISINOPRIL 5 MG PO TABS: ORAL_TABLET | ORAL | Status: DC

## 2013-08-07 NOTE — Telephone Encounter (Signed)
Patients wife phoned concerned with patients diastolic blood pressure being so low. Blood pressure this am 129/52, at 2:15 pm after being outside 123/42, and after sitting for a short while 116/45. Yesterday his diastolic blood pressure 41, 42, and 47 the three times it was taken. Blood pressure systolic usually runs 431'V-400'Q. Heart rate 59-61. Per wife on Friday patient felt like he was going to pass out and that he just seemed "off". Will forward to  Dr. Mare Ferrari for review.

## 2013-08-07 NOTE — Telephone Encounter (Signed)
Discussed with  Dr. Mare Ferrari and will have patient decrease his Lisinopril to just 10 mg in the morning and 5 mg in the evening. Advised wife.

## 2013-08-07 NOTE — Telephone Encounter (Signed)
Pts wife called and his BP bottom # is running real low. It is in the 40's. Please call and advise.

## 2013-08-10 ENCOUNTER — Other Ambulatory Visit: Payer: Self-pay

## 2013-08-10 MED ORDER — LISINOPRIL 5 MG PO TABS: ORAL_TABLET | ORAL | Status: DC

## 2013-09-07 ENCOUNTER — Telehealth: Payer: Self-pay | Admitting: Cardiology

## 2013-09-07 MED ORDER — LISINOPRIL 10 MG PO TABS: ORAL_TABLET | ORAL | Status: DC

## 2013-09-07 MED ORDER — LISINOPRIL 5 MG PO TABS: ORAL_TABLET | ORAL | Status: DC

## 2013-09-07 NOTE — Telephone Encounter (Signed)
°  Pt's wife has questions about medication. Please call & advise.

## 2013-09-07 NOTE — Telephone Encounter (Signed)
Spoke with wife and insurance will not cover the Lisinopril 5 mg 3 tablets daily (patient takes 10 mg in the evening and 5 mg in the morning). Spoke with Clair Gulling pharmacist at [facility] and will Rx the Lisinopril 5 mg and 10 mg both so patient can take 5 mg in am and 10 mg in evening. Wife aware

## 2013-09-22 ENCOUNTER — Ambulatory Visit (INDEPENDENT_AMBULATORY_CARE_PROVIDER_SITE_OTHER): Payer: Medicare Other | Admitting: Cardiology

## 2013-09-22 VITALS — BP 134/50 | HR 51 | Ht 71.0 in | Wt 170.0 lb

## 2013-09-22 DIAGNOSIS — Z952 Presence of prosthetic heart valve: Secondary | ICD-10-CM

## 2013-09-22 DIAGNOSIS — Z954 Presence of other heart-valve replacement: Secondary | ICD-10-CM

## 2013-09-22 DIAGNOSIS — R351 Nocturia: Secondary | ICD-10-CM

## 2013-09-22 DIAGNOSIS — R972 Elevated prostate specific antigen [PSA]: Secondary | ICD-10-CM

## 2013-09-22 DIAGNOSIS — E7800 Pure hypercholesterolemia, unspecified: Secondary | ICD-10-CM

## 2013-09-22 DIAGNOSIS — I10 Essential (primary) hypertension: Secondary | ICD-10-CM

## 2013-09-22 DIAGNOSIS — N401 Enlarged prostate with lower urinary tract symptoms: Secondary | ICD-10-CM

## 2013-09-22 NOTE — Assessment & Plan Note (Signed)
Blood pressure has been stable on current regimen of lisinopril 5 mg in the morning and 10 mg in the evening.

## 2013-09-22 NOTE — Assessment & Plan Note (Signed)
The patient had recent lab work at his PCP office.  His lipids were satisfactory.

## 2013-09-22 NOTE — Assessment & Plan Note (Signed)
Patient states that he is able to bring his PSA level down by eating pumpkinseeds.  He does not see urology.  He states that his recent PSA level done at [facility] office was negative.

## 2013-09-22 NOTE — Patient Instructions (Signed)
Your physician recommends that you continue on your current medications as directed. Please refer to the Current Medication list given to you today.  Your physician wants you to follow-up in: 6 month ov/ekg You will receive a reminder letter in the mail two months in advance. If you don't receive a letter, please call our office to schedule the follow-up appointment.  

## 2013-09-22 NOTE — Assessment & Plan Note (Signed)
The patient is not having any increased shortness of breath.  no dizziness or syncope.  No chest pain.

## 2013-09-22 NOTE — Progress Notes (Signed)
[name] [name] Date of Birth:  Oct 29, 1940 [facility] [address] [place], [place]  [address] [phone]        Fax   [phone]   History of Present Illness: This pleasant 73 year old gentleman is seen for a scheduled followup office visit. 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 His surgery was by Dr. Servando Snare in 02/09/03. At the time of his cardiac catheterization he did not have any significant coronary disease and did not require bypass surgery. He also has a history of hypercholesterolemia. He also has a history of BPH with nocturia and his PSA a year ago was 3.31, and a repeat PSA on 05/06/11 was elevated at 5.87. He was advised at that time to see a urologist but did not see one. He saw his PCP. The patient started eating pumpkinseed's and this has brought his PSA down to its current level of 3.2. Of note is the fact that he tells me that his father died of prostate cancer with bony metastases at age 42.  The patient does not see a urologist. After his last visit in October 2014 the patient underwent an chest x-ray which showed heart size at upper normal and lungs were clear. On 11/14/12 patient had an echocardiogram showing an ejection fraction of 60-65% with grade 2 diastolic dysfunction. His prosthetic valve showed moderate aortic stenosis with peak gradient 55 and a mean gradient of 22. He also has moderate aortic insufficiency.  Since last visit he has had no new cardiac symptoms.  Current Outpatient Prescriptions  Medication Sig Dispense Refill  . amoxicillin (AMOXIL) 500 MG tablet 4 tablets 1 hour prior to procedure  20 tablet  1  . aspirin 325 MG tablet Take 325 mg by mouth as directed. 1/2 tablet daily      . brimonidine (ALPHAGAN) 0.15 % ophthalmic solution Place 3 drops into both eyes daily.       . Ibuprofen 200 MG CAPS Take by mouth. As needed      . latanoprost (XALATAN) 0.005 %  ophthalmic solution 1 drop at bedtime.      Marland Kitchen lisinopril (PRINIVIL,ZESTRIL) 10 MG tablet 1 tablet daily in the evening.  90 tablet  3  . lisinopril (PRINIVIL,ZESTRIL) 5 MG tablet 1 tablet daily in the morning  90 tablet  3  . vitamin C (ASCORBIC ACID) 500 MG tablet Take 500 mg by mouth daily.       No current facility-administered medications for this visit.    No Known Allergies  Patient Active Problem List   Diagnosis Date Noted  . Aortic insufficiency and aortic stenosis 12/23/2012  . PVC's (premature ventricular contractions) 12/23/2012  . PAC (premature atrial contraction) 12/23/2012  . Systolic hypertension 45/80/9983  . H/O aortic valve replacement with tissue graft 10/12/2012  . Hypercholesterolemia 03/11/2011  . BPH associated with nocturia 03/11/2011  . Aortic stenosis   . Hypersomnia with sleep apnea, unspecified   . Glaucoma     History  Smoking status  . Never Smoker   Smokeless tobacco  . Not on file    History  Alcohol Use     Family History  Problem Relation Age of Onset  . Heart disease Mother   . Cancer Father     Review of Systems: Constitutional: no fever chills diaphoresis or fatigue or change in weight.  Head and neck: no hearing loss, no epistaxis, no  photophobia or visual disturbance. Respiratory: No cough, shortness of breath or wheezing. Cardiovascular: No chest pain peripheral edema, palpitations. Gastrointestinal: No abdominal distention, no abdominal pain, no change in bowel habits hematochezia or melena. Genitourinary: No dysuria, no frequency, no urgency, no nocturia. Musculoskeletal:No arthralgias, no back pain, no gait disturbance or myalgias. Neurological: No dizziness, no headaches, no numbness, no seizures, no syncope, no weakness, no tremors. Hematologic: No lymphadenopathy, no easy bruising. Psychiatric: No confusion, no hallucinations, no sleep disturbance.    Physical Exam: Filed Vitals:   09/22/13 0830  BP: 134/50    Pulse: 51  The patient appears to be in no distress.  Head and neck exam reveals that the pupils are equal and reactive.  The extraocular movements are full.  There is no scleral icterus.  Mouth and pharynx are benign.  No lymphadenopathy.  No carotid bruits.  The jugular venous pressure is normal.  Thyroid is not enlarged or tender.  Carotid upstroke is normal.  May aortic systolic murmur is audible in the carotids.  Chest is clear to percussion and auscultation.  No rales or rhonchi.  Expansion of the chest is symmetrical.  Heart reveals no abnormal lift or heave.  First and second heart sounds are normal.  There is grade 3/6 harsh systolic ejection murmur at the aortic area.  No definite diastolic murmur heard  The abdomen is soft and nontender.  Bowel sounds are normoactive.  There is no hepatosplenomegaly or mass.  There are no abdominal bruits.  Extremities reveal no phlebitis or edema.  Pedal pulses are good.  There is no cyanosis or clubbing.  Neurologic exam is normal strength and no lateralizing weakness.  No sensory deficits.  Integument reveals no rash  EKG shows sinus bradycardia with first degree AV block and LVH with strain.  Since 12/23/12, no significant change.  Assessment / Plan: 1. 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 3. normal left ventricular systolic function with grade 2 diastolic dysfunction 4. history of sleep apnea followed by Dr. Baird Lyons 5. past history of elevated PSA.  Patient not interested in seeing urology.  Plan: Continue current medications.  Recheck in 6 months for office visit and EKG.

## 2013-12-11 ENCOUNTER — Telehealth: Payer: Self-pay | Admitting: *Deleted

## 2013-12-11 ENCOUNTER — Telehealth: Payer: Self-pay | Admitting: Cardiology

## 2013-12-11 DIAGNOSIS — Z952 Presence of prosthetic heart valve: Secondary | ICD-10-CM

## 2013-12-11 DIAGNOSIS — R0602 Shortness of breath: Secondary | ICD-10-CM

## 2013-12-11 NOTE — Telephone Encounter (Signed)
Spoke with wife and patient is having increase shortness of breath, increasingly worse over the last couple of weeks  Did see PCP and he recommended chest Xray but wife wanted done in [place] Discussed with  Dr. Mare Ferrari and will have patient get Xray and Echo tomorrow and scheduled ov for Thursday  Advised wife

## 2013-12-11 NOTE — Telephone Encounter (Signed)
Rescheduled ov for later Thursday morning at request of wife

## 2013-12-11 NOTE — Telephone Encounter (Signed)
New message     Wife says pt is having sob when he exerts himself.  He also has sleep apnea.  He is not sleeping.  Now he is ok but wife thinks his jan appt needs to be moved up.  Please call

## 2013-12-11 NOTE — Telephone Encounter (Signed)
Follow Up  Pt wife calling back,

## 2013-12-12 ENCOUNTER — Ambulatory Visit (HOSPITAL_COMMUNITY): Payer: Medicare Other | Attending: Cardiovascular Disease | Admitting: Cardiology

## 2013-12-12 ENCOUNTER — Ambulatory Visit
Admission: RE | Admit: 2013-12-12 | Discharge: 2013-12-12 | Disposition: A | Discharge status: Home / Self Care | Payer: Medicare Other | Source: Ambulatory Visit | Attending: Cardiology | Admitting: Cardiology

## 2013-12-12 DIAGNOSIS — R0602 Shortness of breath: Secondary | ICD-10-CM | POA: Insufficient documentation

## 2013-12-12 DIAGNOSIS — Z952 Presence of prosthetic heart valve: Secondary | ICD-10-CM | POA: Diagnosis not present

## 2013-12-12 DIAGNOSIS — Z954 Presence of other heart-valve replacement: Secondary | ICD-10-CM

## 2013-12-12 NOTE — Progress Notes (Signed)
Echo performed. 

## 2013-12-13 ENCOUNTER — Telehealth: Payer: Self-pay | Admitting: *Deleted

## 2013-12-13 ENCOUNTER — Telehealth: Payer: Self-pay | Admitting: Cardiology

## 2013-12-13 MED ORDER — FUROSEMIDE 40 MG PO TABS: 40.0000 mg | ORAL_TABLET | Freq: Every day | ORAL | Status: DC

## 2013-12-13 NOTE — Telephone Encounter (Signed)
New Msg   Patient wife [name] [name] is calling, states a prescription should have been called in after visit today but it hasn't. Please contact wife at [phone].

## 2013-12-13 NOTE — Telephone Encounter (Signed)
-----   Message from Darlin Coco, MD sent at 12/13/2013  9:06 AM EST ----- I spoke with his wife about the chest x-ray.  There is evidence of worsening CHF.  We will start Lasix 40 mg one daily.  Patient has an appointment to see [place] tomorrow

## 2013-12-13 NOTE — Telephone Encounter (Signed)
Rx sent to pharmacy   

## 2013-12-13 NOTE — Telephone Encounter (Signed)
[name] [name] at 12/13/2013 12:06 PM     Status: Signed       Expand All Collapse All   Rx sent to [facility] at 12/13/2013 10:00 AM     Status: Signed       Expand All Collapse All   ----- Message from Darlin Coco, MD sent at 12/13/2013 9:06 AM EST ----- I spoke with his wife about the chest x-ray. There is evidence of worsening CHF. We will start Lasix 40 mg one daily. Patient has an appointment to see [place] tomorrow

## 2013-12-14 ENCOUNTER — Encounter: Payer: Self-pay | Admitting: Cardiology

## 2013-12-14 ENCOUNTER — Ambulatory Visit: Payer: Medicare Other | Admitting: Cardiology

## 2013-12-14 ENCOUNTER — Ambulatory Visit (INDEPENDENT_AMBULATORY_CARE_PROVIDER_SITE_OTHER): Payer: Medicare Other | Admitting: Cardiology

## 2013-12-14 VITALS — BP 142/66 | HR 62 | Ht 71.0 in | Wt 171.0 lb

## 2013-12-14 DIAGNOSIS — I519 Heart disease, unspecified: Secondary | ICD-10-CM

## 2013-12-14 DIAGNOSIS — T8201XA Breakdown (mechanical) of heart valve prosthesis, initial encounter: Secondary | ICD-10-CM

## 2013-12-14 DIAGNOSIS — R0602 Shortness of breath: Secondary | ICD-10-CM

## 2013-12-14 DIAGNOSIS — I5189 Other ill-defined heart diseases: Secondary | ICD-10-CM | POA: Insufficient documentation

## 2013-12-14 LAB — CBC WITH DIFFERENTIAL/PLATELET
BASOS PCT: 0.7 % (ref 0.0–3.0)
Basophils Absolute: 0.1 10*3/uL (ref 0.0–0.1)
EOS ABS: 0.1 10*3/uL (ref 0.0–0.7)
Eosinophils Relative: 1.1 % (ref 0.0–5.0)
HCT: 33.5 % — ABNORMAL LOW (ref 39.0–52.0)
HEMOGLOBIN: 11.3 g/dL — AB (ref 13.0–17.0)
Lymphocytes Relative: 21.8 % (ref 12.0–46.0)
Lymphs Abs: 1.8 10*3/uL (ref 0.7–4.0)
MCHC: 33.6 g/dL (ref 30.0–36.0)
MCV: 86.8 fl (ref 78.0–100.0)
MONO ABS: 0.5 10*3/uL (ref 0.1–1.0)
Monocytes Relative: 5.9 % (ref 3.0–12.0)
Neutro Abs: 5.7 10*3/uL (ref 1.4–7.7)
Neutrophils Relative %: 70.5 % (ref 43.0–77.0)
Platelets: 146 10*3/uL — ABNORMAL LOW (ref 150.0–400.0)
RBC: 3.86 Mil/uL — AB (ref 4.22–5.81)
RDW: 14.2 % (ref 11.5–15.5)
WBC: 8.1 10*3/uL (ref 4.0–10.5)

## 2013-12-14 LAB — BASIC METABOLIC PANEL
BUN: 18 mg/dL (ref 6–23)
CHLORIDE: 110 meq/L (ref 96–112)
CO2: 18 mEq/L — ABNORMAL LOW (ref 19–32)
Calcium: 8.3 mg/dL — ABNORMAL LOW (ref 8.4–10.5)
Creatinine, Ser: 1 mg/dL (ref 0.4–1.5)
GFR: 82.54 mL/min (ref >60.00)
Glucose, Bld: 93 mg/dL (ref 70–99)
Potassium: 3.7 mEq/L (ref 3.5–5.1)
Sodium: 138 mEq/L (ref 135–145)

## 2013-12-14 LAB — APTT: aPTT: 26.3 s (ref 23.4–32.7)

## 2013-12-14 LAB — PROTIME-INR
INR: 1 ratio (ref 0.8–1.0)
PROTHROMBIN TIME: 11.6 s (ref 9.6–13.1)

## 2013-12-14 NOTE — Patient Instructions (Addendum)
Will obtain labs today and call you with the results (CBC/BMET/PTT/PT/INR)  Your physician recommends that you continue on your current medications as directed. Please refer to the Current Medication list given to you today.  You are scheduled for a cardiac catheterization on 12/18/13 with Dr. Martin[place] Go to [facility] 2nd Floor Short Stay on 12/18/13 at 7:00 am.  Enter thru the [facility] entrance A No food or drink after midnight on 12/17/13 You may take your Aspirin with a sip of water on the day of your procedure.

## 2013-12-14 NOTE — Progress Notes (Signed)
Quick Note:  Please report to patient. The recent labs are stable. Continue same medication and careful diet. There is mild anemia. The potassium is low normal. Add KCl 10 meq one daily. Also arrange for him to get serum iron and TIBC and retic count. ______

## 2013-12-14 NOTE — Progress Notes (Signed)
[name] [name] Date of Birth:  12/03/1940 [facility] [address] [place], [place]  [address] [phone]        Fax   [phone]   History of Present Illness: This pleasant 73 year old gentleman is seen for a work in  office visit. 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 His surgery was by Dr. Servando Snare in 02/09/03. At the time of his cardiac catheterization he did not have any significant coronary disease and did not require bypass surgery. He also has a history of hypercholesterolemia. He also has a history of BPH with nocturia and his PSA a year ago was 3.31, and a repeat PSA on 05/06/11 was elevated at 5.87. He was advised at that time to see a urologist but did not see one. He saw his PCP. The patient started eating pumpkinseed's and this has brought his PSA down to its current level of 3.2. Of note is the fact that he tells me that his father died of prostate cancer with bony metastases at age 57.  The patient does not see a urologist. After his last visit in October 2014 the patient underwent an chest x-ray which showed heart size at upper normal and lungs were clear. On 11/14/12 patient had an echocardiogram showing an ejection fraction of 60-65% with grade 2 diastolic dysfunction. His prosthetic valve showed moderate aortic stenosis with peak gradient 55 and a mean gradient of 22. He also has moderate aortic insufficiency.  Recently the patient has developed symptoms of increased dyspnea.  He has developed orthopnea.  This began in October 2015.  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  His left ventricle was dilated and his ejection fraction had fallen to 30-35%.  He has not been experiencing any peripheral edema.  He has had some tightness in his left upper abdomen but not in his precordial  area.  Current Outpatient Prescriptions  Medication Sig Dispense Refill  . amoxicillin (AMOXIL) 500 MG tablet 4 tablets 1 hour prior to procedure 20 tablet 1  . aspirin 325 MG tablet Take 325 mg by mouth as directed. 1/2 tablet daily    . brimonidine (ALPHAGAN) 0.15 % ophthalmic solution Place 3 drops into both eyes daily.     . furosemide (LASIX) 40 MG tablet Take 1 tablet (40 mg total) by mouth daily. 30 tablet 5  . Ibuprofen 200 MG CAPS Take by mouth. As needed    . latanoprost (XALATAN) 0.005 % ophthalmic solution 1 drop at bedtime.    Marland Kitchen lisinopril (PRINIVIL,ZESTRIL) 10 MG tablet 1 tablet daily in the evening. 90 tablet 3  . lisinopril (PRINIVIL,ZESTRIL) 5 MG tablet 1 tablet daily in the morning 90 tablet 3  . vitamin C (ASCORBIC ACID) 500 MG tablet Take 500 mg by mouth daily.     No current facility-administered medications for this visit.    No Known Allergies  Patient Active Problem List   Diagnosis Date Noted  . Left ventricular systolic dysfunction 93/23/5573  . 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 Aortic insufficiency and aortic stenosis 12/23/2012  . PVC's (premature ventricular contractions) 12/23/2012  . PAC (premature atrial contraction) 12/23/2012  . Systolic hypertension 22/02/5425  . H/O aortic valve replacement with tissue graft 10/12/2012  . Hypercholesterolemia 03/11/2011  . BPH associated with nocturia 03/11/2011  . Aortic stenosis   .  Hypersomnia with sleep apnea, unspecified   . Glaucoma     History  Smoking status  . Never Smoker   Smokeless tobacco  . Not on file    History  Alcohol Use: Not on file    Family History  Problem Relation Age of Onset  . Heart disease Mother   . Cancer Father     Review of Systems: Constitutional: no fever chills diaphoresis or fatigue or change in weight.  Head and neck: no hearing loss, no epistaxis, no photophobia or visual disturbance. Respiratory: No cough, shortness of breath or  wheezing. Cardiovascular: No chest pain peripheral edema, palpitations. Gastrointestinal: No abdominal distention, no abdominal pain, no change in bowel habits hematochezia or melena. Genitourinary: No dysuria, no frequency, no urgency, no nocturia. Musculoskeletal:No arthralgias, no back pain, no gait disturbance or myalgias. Neurological: No dizziness, no headaches, no numbness, no seizures, no syncope, no weakness, no tremors. Hematologic: No lymphadenopathy, no easy bruising. Psychiatric: No confusion, no hallucinations, no sleep disturbance.    Physical Exam: Filed Vitals:   12/14/13 0956  BP: 142/66  Pulse: 62  The patient appears to be in no distress.  Head and neck exam reveals that the pupils are equal and reactive.  The extraocular movements are full.  There is no scleral icterus.  Mouth and pharynx are benign.  No lymphadenopathy.  No carotid bruits.  The jugular venous pressure is normal.  Thyroid is not enlarged or tender.  Carotid upstroke is normal.  His aortic systolic murmur is audible in the carotids.  Chest reveals diminished breath sounds at both bases with dullness at the right base.  Heart reveals no abnormal lift or heave.  First and second heart sounds are normal.  There is grade 3/6 harsh systolic ejection murmur at the aortic area.  There is a grade 2/6 decrescendo diastolic murmur aortic insufficiency along the left sternal edge.  The abdomen is soft and nontender.  Bowel sounds are normoactive.  There is no hepatosplenomegaly or mass.  There are no abdominal bruits.  Extremities reveal no phlebitis or edema.  Pedal pulses are good.  There is no cyanosis or clubbing.  Neurologic exam is normal strength and no lateralizing weakness.  No sensory deficits.  Integument reveals no rash  EKG shows normal sinus rhythm with first-degree AV block and occasional PVC there is left ventricular hypertrophy with secondary ST-T wave change and there is prolonged QTc  interval  Assessment / Plan: 1. 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  The severity of the gradient may be diminished because of his poor left ventricular function 3.  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 history of sleep apnea followed by Dr. Baird Lyons 5. past history of elevated PSA.  Patient not interested in seeing urology.  Plan: We will arrange for right and left heart cardiac catheterization after which we will want to send him back to Dr. Servando Snare for surgical consultation.  We started him on Lasix 40 mg daily yesterday  and he had an initial good diuresis last night. Preoperative labs were drawn today and he is scheduled for cardiac catheterization on Monday December 7 with Dr. Martinique.

## 2013-12-15 ENCOUNTER — Telehealth: Payer: Self-pay | Admitting: *Deleted

## 2013-12-15 ENCOUNTER — Other Ambulatory Visit: Payer: Self-pay

## 2013-12-15 DIAGNOSIS — R0602 Shortness of breath: Secondary | ICD-10-CM

## 2013-12-15 DIAGNOSIS — D649 Anemia, unspecified: Secondary | ICD-10-CM

## 2013-12-15 MED ORDER — LISINOPRIL 5 MG PO TABS: ORAL_TABLET | ORAL | Status: DC

## 2013-12-15 MED ORDER — POTASSIUM CHLORIDE ER 10 MEQ PO TBCR: 10.0000 meq | EXTENDED_RELEASE_TABLET | Freq: Every day | ORAL | Status: DC

## 2013-12-15 NOTE — Telephone Encounter (Signed)
Advised wife of labs. Per wife patient has had Hgb checks recently at PCP and thinks iron level done. Will call PCP Monday and requests labs

## 2013-12-15 NOTE — Telephone Encounter (Signed)
-----   Message from Darlin Coco, MD sent at 12/14/2013 10:04 PM EST ----- Please report to patient.  The recent labs are stable. Continue same medication and careful diet. There is mild anemia. The potassium is low normal. Add KCl 10 meq one daily. Also arrange for him to get serum iron and TIBC and retic count.

## 2013-12-18 ENCOUNTER — Ambulatory Visit (HOSPITAL_COMMUNITY)
Admission: RE | Admit: 2013-12-18 | Discharge: 2013-12-18 | Disposition: A | Discharge status: Home / Self Care | Payer: Medicare Other | Source: Ambulatory Visit | Attending: Cardiology | Admitting: Cardiology

## 2013-12-18 ENCOUNTER — Encounter (HOSPITAL_COMMUNITY)
Admission: RE | Disposition: A | Discharge status: Home / Self Care | Payer: Self-pay | Source: Ambulatory Visit | Attending: Cardiology

## 2013-12-18 DIAGNOSIS — I350 Nonrheumatic aortic (valve) stenosis: Secondary | ICD-10-CM

## 2013-12-18 DIAGNOSIS — Z954 Presence of other heart-valve replacement: Secondary | ICD-10-CM

## 2013-12-18 DIAGNOSIS — I352 Nonrheumatic aortic (valve) stenosis with insufficiency: Secondary | ICD-10-CM | POA: Diagnosis not present

## 2013-12-18 DIAGNOSIS — R0602 Shortness of breath: Secondary | ICD-10-CM

## 2013-12-18 DIAGNOSIS — I272 Other secondary pulmonary hypertension: Secondary | ICD-10-CM | POA: Insufficient documentation

## 2013-12-18 DIAGNOSIS — I2510 Atherosclerotic heart disease of native coronary artery without angina pectoris: Secondary | ICD-10-CM | POA: Insufficient documentation

## 2013-12-18 DIAGNOSIS — I519 Heart disease, unspecified: Secondary | ICD-10-CM

## 2013-12-18 DIAGNOSIS — I10 Essential (primary) hypertension: Secondary | ICD-10-CM | POA: Diagnosis present

## 2013-12-18 DIAGNOSIS — I5189 Other ill-defined heart diseases: Secondary | ICD-10-CM

## 2013-12-18 DIAGNOSIS — T8201XA Breakdown (mechanical) of heart valve prosthesis, initial encounter: Secondary | ICD-10-CM

## 2013-12-18 HISTORY — PX: LEFT AND RIGHT HEART CATHETERIZATION WITH CORONARY ANGIOGRAM: SHX5449

## 2013-12-18 LAB — POCT I-STAT 3, ART BLOOD GAS (G3+)
Acid-base deficit: 3 mmol/L — ABNORMAL HIGH (ref 0.0–2.0)
Bicarbonate: 20.8 mEq/L (ref 20.0–24.0)
O2 Saturation: 99 %
PCO2 ART: 30.8 mmHg — AB (ref 35.0–45.0)
PH ART: 7.438 (ref 7.350–7.450)
PO2 ART: 137 mmHg — AB (ref 80.0–100.0)
TCO2: 22 mmol/L (ref 0–100)

## 2013-12-18 LAB — POCT I-STAT 3, VENOUS BLOOD GAS (G3P V)
Acid-base deficit: 2 mmol/L (ref 0.0–2.0)
Bicarbonate: 23.5 mEq/L (ref 20.0–24.0)
O2 Saturation: 66 %
PH VEN: 7.378 — AB (ref 7.250–7.300)
PO2 VEN: 35 mmHg (ref 30.0–45.0)
TCO2: 25 mmol/L (ref 0–100)
pCO2, Ven: 39.9 mmHg — ABNORMAL LOW (ref 45.0–50.0)

## 2013-12-18 LAB — POCT ACTIVATED CLOTTING TIME: ACTIVATED CLOTTING TIME: 159 s

## 2013-12-18 SURGERY — LEFT AND RIGHT HEART CATHETERIZATION WITH CORONARY ANGIOGRAM
Anesthesia: LOCAL

## 2013-12-18 MED ORDER — SODIUM CHLORIDE 0.9 % IV SOLN: 250.0000 mL | INTRAVENOUS | Status: DC | PRN

## 2013-12-18 MED ORDER — LIDOCAINE HCL (PF) 1 % IJ SOLN
INTRAMUSCULAR | Status: AC
  Filled 2013-12-18: qty 30

## 2013-12-18 MED ORDER — FENTANYL CITRATE 0.05 MG/ML IJ SOLN
INTRAMUSCULAR | Status: AC
  Filled 2013-12-18: qty 2

## 2013-12-18 MED ORDER — MIDAZOLAM HCL 2 MG/2ML IJ SOLN
INTRAMUSCULAR | Status: AC
  Filled 2013-12-18: qty 2

## 2013-12-18 MED ORDER — HEPARIN (PORCINE) IN NACL 2-0.9 UNIT/ML-% IJ SOLN
INTRAMUSCULAR | Status: AC
  Filled 2013-12-18: qty 1000

## 2013-12-18 MED ORDER — ASPIRIN 81 MG PO CHEW
81.0000 mg | CHEWABLE_TABLET | ORAL | Status: AC
  Administered 2013-12-18: 81 mg via ORAL

## 2013-12-18 MED ORDER — NITROGLYCERIN 1 MG/10 ML FOR IR/CATH LAB
INTRA_ARTERIAL | Status: AC
  Filled 2013-12-18: qty 10

## 2013-12-18 MED ORDER — HEPARIN SODIUM (PORCINE) 1000 UNIT/ML IJ SOLN
INTRAMUSCULAR | Status: AC
  Filled 2013-12-18: qty 1

## 2013-12-18 MED ORDER — SODIUM CHLORIDE 0.9 % IV SOLN
INTRAVENOUS | Status: DC
  Administered 2013-12-18: 08:00:00 via INTRAVENOUS

## 2013-12-18 MED ORDER — HEPARIN (PORCINE) IN NACL 2-0.9 UNIT/ML-% IJ SOLN
INTRAMUSCULAR | Status: AC
  Filled 2013-12-18: qty 500

## 2013-12-18 MED ORDER — VERAPAMIL HCL 2.5 MG/ML IV SOLN
INTRAVENOUS | Status: AC
  Filled 2013-12-18: qty 2

## 2013-12-18 MED ORDER — SODIUM CHLORIDE 0.9 % IJ SOLN: 3.0000 mL | INTRAMUSCULAR | Status: DC | PRN

## 2013-12-18 MED ORDER — SODIUM CHLORIDE 0.9 % IV SOLN: 1.0000 mL/kg/h | INTRAVENOUS | Status: DC

## 2013-12-18 MED ORDER — SODIUM CHLORIDE 0.9 % IJ SOLN
3.0000 mL | Freq: Two times a day (BID) | INTRAMUSCULAR | Status: DC
Start: 2013-12-18 — End: 2013-12-18
  Administered 2013-12-18: 3 mL via INTRAVENOUS

## 2013-12-18 NOTE — Progress Notes (Signed)
1] rt brachial vein sheath pulled and preure held for 10 min. Level 0. Tegaderm dressing applied.  2] Rt femoral vein sheath pulled and pressure held for 15 min. Tegaderm dressing applied. Level 0   tr band at 17cc, circulation stable.   Instructed on bedrest and restriction

## 2013-12-18 NOTE — Discharge Instructions (Signed)
Radial Site Care °Refer to this sheet in the next few weeks. These instructions provide you with information on caring for yourself after your procedure. Your caregiver may also give you more specific instructions. Your treatment has been planned according to current medical practices, but problems sometimes occur. Call your caregiver if you have any problems or questions after your procedure. °HOME CARE INSTRUCTIONS °· You may shower the day after the procedure. Remove the bandage (dressing) and gently wash the site with plain soap and water. Gently pat the site dry. °· Do not apply powder or lotion to the site. °· Do not submerge the affected site in water for 3 to 5 days. °· Inspect the site at least twice daily. °· Do not flex or bend the affected arm for 24 hours. °· No lifting over 5 pounds (2.3 kg) for 5 days after your procedure. °· Do not drive home if you are discharged the same day of the procedure. Have someone else drive you. °· You may drive 24 hours after the procedure unless otherwise instructed by your caregiver. °· Do not operate machinery or power tools for 24 hours. °· A responsible adult should be with you for the first 24 hours after you arrive home. °What to expect: °· Any bruising will usually fade within 1 to 2 weeks. °· Blood that collects in the tissue (hematoma) may be painful to the touch. It should usually decrease in size and tenderness within 1 to 2 weeks. °SEEK IMMEDIATE MEDICAL CARE IF: °· You have unusual pain at the radial site. °· You have redness, warmth, swelling, or pain at the radial site. °· You have drainage (other than a small amount of blood on the dressing). °· You have chills. °· You have a fever or persistent symptoms for more than 72 hours. °· You have a fever and your symptoms suddenly get worse. °· Your arm becomes pale, cool, tingly, or numb. °· You have heavy bleeding from the site. Hold pressure on the site. °Document Released: 01/31/2010 Document Revised:  03/23/2011 Document Reviewed: 01/31/2010 °ExitCare® Patient Information ©2015 [employer]. This information is not intended to replace advice given to you by your health care provider. Make sure you discuss any questions you have with your health care provider. ° °

## 2013-12-18 NOTE — Progress Notes (Signed)
TR BAND REMOVAL  LOCATION:    right radial  DEFLATED PER PROTOCOL:    Yes.    TIME BAND OFF / DRESSING APPLIED:    1300   SITE UPON ARRIVAL:    Level 0  SITE AFTER BAND REMOVAL:    Level 0  CIRCULATION SENSATION AND MOVEMENT:    Within Normal Limits   Yes.    COMMENTS:   TRB REMOVED/ TEGADERM DSG APPLIED. 

## 2013-12-18 NOTE — Interval H&P Note (Signed)
History and Physical Interval Note:  12/18/2013 8:43 AM  [name] [name]  has presented today for surgery, with the diagnosis of left ventricular systolic dysfunction, cp  The various methods of treatment have been discussed with the patient and family. After consideration of risks, benefits and other options for treatment, the patient has consented to  Procedure(s): LEFT AND RIGHT HEART CATHETERIZATION WITH CORONARY ANGIOGRAM (N/A) as a surgical intervention .  The patient's history has been reviewed, patient examined, no change in status, stable for surgery.  I have reviewed the patient's chart and labs.  Questions were answered to the patient's satisfaction.    Cath Lab Visit (complete for each Cath Lab visit)  Clinical Evaluation Leading to the Procedure:   ACS: No.  Non-ACS:    Anginal Classification: CCS II  Anti-ischemic medical therapy: Minimal Therapy (1 class of medications)  Non-Invasive Test Results: No non-invasive testing performed  Prior CABG: No previous CABG       Collier [facility] 12/18/2013 8:43 AM

## 2013-12-18 NOTE — Telephone Encounter (Signed)
New Message  Pt cath was today 12/7. Pt's wife called wanting to follow up with [facility] and specifically to ask about a fluid pill that he did not take today. Please call back and discuss.

## 2013-12-18 NOTE — H&P (View-Only) (Signed)
[name] Date of Birth:  1940/05/19 [facility] [address] [place], [place]  [address] [phone]        Fax   [phone]   History of Present Illness: This pleasant 73 year old gentleman is seen for a work in  office visit. 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 His surgery was by Dr. Servando Snare in 02/09/03. At the time of his cardiac catheterization he did not have any significant coronary disease and did not require bypass surgery. He also has a history of hypercholesterolemia. He also has a history of BPH with nocturia and his PSA a year ago was 3.31, and a repeat PSA on 05/06/11 was elevated at 5.87. He was advised at that time to see a urologist but did not see one. He saw his PCP. The patient started eating pumpkinseed's and this has brought his PSA down to its current level of 3.2. Of note is the fact that he tells me that his father died of prostate cancer with bony metastases at age 78.  The patient does not see a urologist. After his last visit in October 2014 the patient underwent an chest x-ray which showed heart size at upper normal and lungs were clear. On 11/14/12 patient had an echocardiogram showing an ejection fraction of 60-65% with grade 2 diastolic dysfunction. His prosthetic valve showed moderate aortic stenosis with peak gradient 55 and a mean gradient of 22. He also has moderate aortic insufficiency.  Recently the patient has developed symptoms of increased dyspnea.  He has developed orthopnea.  This began in October 2015.  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  His left ventricle was dilated and his ejection fraction had fallen to 30-35%.  He has not been experiencing any peripheral edema.  He has had some tightness in his left upper abdomen but not in his precordial  area.  Current Outpatient Prescriptions  Medication Sig Dispense Refill  . amoxicillin (AMOXIL) 500 MG tablet 4 tablets 1 hour prior to procedure 20 tablet 1  . aspirin 325 MG tablet Take 325 mg by mouth as directed. 1/2 tablet daily    . brimonidine (ALPHAGAN) 0.15 % ophthalmic solution Place 3 drops into both eyes daily.     . furosemide (LASIX) 40 MG tablet Take 1 tablet (40 mg total) by mouth daily. 30 tablet 5  . Ibuprofen 200 MG CAPS Take by mouth. As needed    . latanoprost (XALATAN) 0.005 % ophthalmic solution 1 drop at bedtime.    Marland Kitchen lisinopril (PRINIVIL,ZESTRIL) 10 MG tablet 1 tablet daily in the evening. 90 tablet 3  . lisinopril (PRINIVIL,ZESTRIL) 5 MG tablet 1 tablet daily in the morning 90 tablet 3  . vitamin C (ASCORBIC ACID) 500 MG tablet Take 500 mg by mouth daily.     No current facility-administered medications for this visit.    No Known Allergies  Patient Active Problem List   Diagnosis Date Noted  . Left ventricular systolic dysfunction 79/39/0300  . 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 Aortic insufficiency and aortic stenosis 12/23/2012  . PVC's (premature ventricular contractions) 12/23/2012  . PAC (premature atrial contraction) 12/23/2012  . Systolic hypertension 92/33/0076  . H/O aortic valve replacement with tissue graft 10/12/2012  . Hypercholesterolemia 03/11/2011  . BPH associated with nocturia 03/11/2011  . Aortic stenosis   .  Hypersomnia with sleep apnea, unspecified   . Glaucoma     History  Smoking status  . Never Smoker   Smokeless tobacco  . Not on file    History  Alcohol Use: Not on file    Family History  Problem Relation Age of Onset  . Heart disease Mother   . Cancer Father     Review of Systems: Constitutional: no fever chills diaphoresis or fatigue or change in weight.  Head and neck: no hearing loss, no epistaxis, no photophobia or visual disturbance. Respiratory: No cough, shortness of breath or  wheezing. Cardiovascular: No chest pain peripheral edema, palpitations. Gastrointestinal: No abdominal distention, no abdominal pain, no change in bowel habits hematochezia or melena. Genitourinary: No dysuria, no frequency, no urgency, no nocturia. Musculoskeletal:No arthralgias, no back pain, no gait disturbance or myalgias. Neurological: No dizziness, no headaches, no numbness, no seizures, no syncope, no weakness, no tremors. Hematologic: No lymphadenopathy, no easy bruising. Psychiatric: No confusion, no hallucinations, no sleep disturbance.    Physical Exam: Filed Vitals:   12/14/13 0956  BP: 142/66  Pulse: 62  The patient appears to be in no distress.  Head and neck exam reveals that the pupils are equal and reactive.  The extraocular movements are full.  There is no scleral icterus.  Mouth and pharynx are benign.  No lymphadenopathy.  No carotid bruits.  The jugular venous pressure is normal.  Thyroid is not enlarged or tender.  Carotid upstroke is normal.  His aortic systolic murmur is audible in the carotids.  Chest reveals diminished breath sounds at both bases with dullness at the right base.  Heart reveals no abnormal lift or heave.  First and second heart sounds are normal.  There is grade 3/6 harsh systolic ejection murmur at the aortic area.  There is a grade 2/6 decrescendo diastolic murmur aortic insufficiency along the left sternal edge.  The abdomen is soft and nontender.  Bowel sounds are normoactive.  There is no hepatosplenomegaly or mass.  There are no abdominal bruits.  Extremities reveal no phlebitis or edema.  Pedal pulses are good.  There is no cyanosis or clubbing.  Neurologic exam is normal strength and no lateralizing weakness.  No sensory deficits.  Integument reveals no rash  EKG shows normal sinus rhythm with first-degree AV block and occasional PVC there is left ventricular hypertrophy with secondary ST-T wave change and there is prolonged QTc  interval  Assessment / Plan: 1. 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  The severity of the gradient may be diminished because of his poor left ventricular function 3.  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 history of sleep apnea followed by Dr. Baird Lyons 5. past history of elevated PSA.  Patient not interested in seeing urology.  Plan: We will arrange for right and left heart cardiac catheterization after which we will want to send him back to Dr. Servando Snare for surgical consultation.  We started him on Lasix 40 mg daily yesterday  and he had an initial good diuresis last night. Preoperative labs were drawn today and he is scheduled for cardiac catheterization on Monday December 7 with Dr. Martinique.

## 2013-12-18 NOTE — CV Procedure (Signed)
    Cardiac Catheterization Procedure Note  Name: [name] [name] MRN: [id number] DOB: [birth year]  Procedure: Right Heart Cath,  Selective Coronary Angiography, aortic root angiography  Indication: 73 yo WM with 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 EF 30-35% by Echo.   Procedural Details: The right wrist was prepped, draped, and anesthetized with 1% lidocaine. Using the modified Seldinger technique a 6 Fr slender sheath was placed in the right radial artery. Attempted to exchange the brachial IV catheter with a sheath but the IV was in a side branch and the sheath could not be advanced. The right groin was prepped and anesthetized with 1% lidocaine and a 7 Fr. Venous sheath was placed via the right femoral vein.  A Swan-Ganz catheter was used for the right heart catheterization. Standard protocol was followed for recording of right heart pressures and sampling of oxygen saturations. Fick cardiac output was calculated. Standard Judkins catheters were used for selective coronary angiography and aortic root angiography. There were no immediate procedural complications. The patient was transferred to the post catheterization recovery area for further monitoring.  Procedural Findings: Hemodynamics RA 9/8 mean 5 mm Hg RV 48/10 mm Hg PA 45/16 mean 28 mm Hg  PCWP 19/34 mean 21 mm Hg, prominent V waves. LV N/A, The AV was not crossed. AO 102/45 mean 66 mm Hg  Oxygen saturations: PA 66% AO 99%  Cardiac Output (Fick) 5.2 L/min  Cardiac Index (Fick) 2.62 L/min/meter squared   Coronary angiography: Coronary dominance: right  Left mainstem: Short without significant disease.   Left anterior descending (LAD): The LAD is moderately calcified. There is an 80-90% stenosis in the proximal vessel followed by diffuse disease in the mid vessel up to 80-90%  Left circumflex (LCx): The LCx gives off a single large OM branch. It is  without significant disease.  Right coronary artery (RCA): The RCA is diffusely diseased in the proximal to mid vessel up to 30%. There is a focal 50% stenosis at the takeoff of a large RV branch. At the crux there is an 80-90% stenosis.   Left ventriculography: The AV was not crossed.  Aortic root angiography: There is dilation of the proximal aorta. There is severe aortic insufficiency. LV is enlarged with global hypokinesis and moderate to severe LV dysfunction.  Final Conclusions:   1. Severe 2 vessel obstructive CAD 2. Severe aortic valve insufficiency. 3. LV dysfunction 4. Proximal aortic enlargement. 5. Mild pulmonary HTN. 6. Elevated LV filling pressures. Prominent V waves may represent poor LV compliance or mitral insufficiency. Echo showed only mild MR.   Recommendations: Refer to CT surgery for AVR and CABG. Will need to assess MV carefully at the time of surgery with TEE.   [name], [place] 12/18/2013, 9:44 AM

## 2013-12-19 ENCOUNTER — Telehealth: Payer: Self-pay | Admitting: Cardiology

## 2013-12-19 NOTE — Telephone Encounter (Signed)
New Msg  Please contact pt [name] [name] at [phone]. Pt wants to know if they have to schedule a surgery for the pt. Wife states that Dr. Martinique informed them that they need to have surgery scheduled but they are confused about process.

## 2013-12-19 NOTE — Telephone Encounter (Signed)
Spoke with [facility] and they will be calling patient with appointment Advised wife

## 2013-12-19 NOTE — Telephone Encounter (Signed)
Requested labs from PCP yesterday, never received  Was told by office no TIBC done Will have patient come by for labs day he goes to see Dr Servando Snare Discussed with wife and she will call once appointment scheduled

## 2013-12-20 ENCOUNTER — Telehealth: Payer: Self-pay | Admitting: Cardiology

## 2013-12-20 ENCOUNTER — Encounter: Payer: Self-pay | Admitting: Cardiothoracic Surgery

## 2013-12-20 ENCOUNTER — Other Ambulatory Visit (INDEPENDENT_AMBULATORY_CARE_PROVIDER_SITE_OTHER): Payer: Medicare Other | Admitting: *Deleted

## 2013-12-20 ENCOUNTER — Other Ambulatory Visit: Payer: Self-pay | Admitting: *Deleted

## 2013-12-20 ENCOUNTER — Institutional Professional Consult (permissible substitution) (INDEPENDENT_AMBULATORY_CARE_PROVIDER_SITE_OTHER): Payer: Medicare Other | Admitting: Cardiothoracic Surgery

## 2013-12-20 VITALS — BP 110/80 | HR 68 | Resp 16 | Ht 71.0 in | Wt 170.0 lb

## 2013-12-20 DIAGNOSIS — D649 Anemia, unspecified: Secondary | ICD-10-CM

## 2013-12-20 DIAGNOSIS — R0602 Shortness of breath: Secondary | ICD-10-CM

## 2013-12-20 DIAGNOSIS — I351 Nonrheumatic aortic (valve) insufficiency: Secondary | ICD-10-CM

## 2013-12-20 DIAGNOSIS — I2510 Atherosclerotic heart disease of native coronary artery without angina pectoris: Secondary | ICD-10-CM

## 2013-12-20 LAB — RETICULOCYTES
ABS RETIC: 41.1 10*3/uL (ref 19.0–186.0)
RBC.: 3.74 MIL/uL — ABNORMAL LOW (ref 4.22–5.81)
RETIC CT PCT: 1.1 % (ref 0.4–2.3)

## 2013-12-20 LAB — CBC WITH DIFFERENTIAL/PLATELET
BASOS ABS: 0 10*3/uL (ref 0.0–0.1)
Basophils Relative: 0.7 % (ref 0.0–3.0)
EOS PCT: 1.4 % (ref 0.0–5.0)
Eosinophils Absolute: 0.1 10*3/uL (ref 0.0–0.7)
HCT: 33 % — ABNORMAL LOW (ref 39.0–52.0)
Hemoglobin: 10.8 g/dL — ABNORMAL LOW (ref 13.0–17.0)
LYMPHS ABS: 1.6 10*3/uL (ref 0.7–4.0)
LYMPHS PCT: 24.3 % (ref 12.0–46.0)
MCHC: 32.6 g/dL (ref 30.0–36.0)
MCV: 87.6 fl (ref 78.0–100.0)
Monocytes Absolute: 0.5 10*3/uL (ref 0.1–1.0)
Monocytes Relative: 7.3 % (ref 3.0–12.0)
NEUTROS ABS: 4.5 10*3/uL (ref 1.4–7.7)
Neutrophils Relative %: 66.3 % (ref 43.0–77.0)
Platelets: 141 10*3/uL — ABNORMAL LOW (ref 150.0–400.0)
RBC: 3.77 Mil/uL — ABNORMAL LOW (ref 4.22–5.81)
RDW: 14 % (ref 11.5–15.5)
WBC: 6.8 10*3/uL (ref 4.0–10.5)

## 2013-12-20 LAB — IBC PANEL
Iron: 38 ug/dL — ABNORMAL LOW (ref 42–165)
SATURATION RATIOS: 11.3 % — AB (ref 20.0–50.0)
TRANSFERRIN: 240.3 mg/dL (ref 212.0–360.0)

## 2013-12-20 NOTE — Progress Notes (Signed)
[address]       [place] [address]             [phone]                    [name] [facility] Medical Record #[id number] Date of Birth: [birth year]  Referring: Martinique, Peter M, MD Primary Care: Leonides Sake, MD  Chief Complaint:    Chief Complaint  Patient presents with  . Coronary Artery Disease    surgical eval, cardiac cath 12/18/13, HX of CABG/AVR  . Aortic Insuffiency    History of Present Illness:    [name] [name] 73 y.o. male is seen in the office  today for symptoms of increasing chf, including  sob with exertion. He had previous AVR in 2005.      02/09/2003  OPERATIVE REPORT PREOPERATIVE DIAGNOSIS: Critical aortic stenosis. POSTOPERATIVE DIAGNOSIS: Critical aortic stenosis. OPERATION/PROCEDURE: Aortic valve replacement with a #25 pericardial tissue valve. SURGEON: Lilia Argue. Servando Snare, M.D.  Current Activity/ Functional Status:  Patient is independent with mobility/ambulation, transfers, ADL's, IADL's.   Zubrod Score: At the time of surgery this patient's most appropriate activity status/level should be described as: []     0    Normal activity, no symptoms [x]     1    Restricted in physical strenuous activity but ambulatory, able to do out light work []     2    Ambulatory and capable of self care, unable to do work activities, up and about               >50 % of waking hours                              []     3    Only limited self care, in bed greater than 50% of waking hours []     4    Completely disabled, no self care, confined to bed or chair []     5    Moribund   Past Medical History  Diagnosis Date  . Aortic stenosis   . Hyperlipidemia   . Heart murmur   . Sleep apnea     mild to moderate  . Glaucoma     low tension    Past Surgical History  Procedure Laterality Date  . Cardiac catheterization  02/08/2003    severe aortic stenosis,normal left ventricular function  . Tissue aortic valve replacement   02/09/2003    # 25 pericardial tissue valve  . Inguinal hernia repair  04/2003    Family History  Problem Relation Age of Onset  . Heart disease Mother   . Cancer Father     History   Social History  . Marital Status: Married    Spouse Name: N/A    Number of Children: N/A  . Years of Education: N/A   Occupational History  . Not on file.   Social History Main Topics  . Smoking status: Never Smoker   . Smokeless tobacco: Not on file  . Alcohol Use: Not on file  . Drug Use: Not on file  . Sexual Activity: Not on file   Other Topics Concern  . Not on file   Social History Narrative    History  Smoking status  . Never Smoker   Smokeless tobacco  . Not on file    History  Alcohol Use: Not on file  No Known Allergies  Current Outpatient Prescriptions  Medication Sig Dispense Refill  . amoxicillin (AMOXIL) 500 MG tablet 4 tablets 1 hour prior to procedure 20 tablet 1  . aspirin 325 MG tablet Take 325 mg by mouth as directed. 1/2 tablet daily    . brimonidine (ALPHAGAN) 0.15 % ophthalmic solution Place 3 drops into both eyes daily.     . furosemide (LASIX) 40 MG tablet Take 1 tablet (40 mg total) by mouth daily. 30 tablet 5  . latanoprost (XALATAN) 0.005 % ophthalmic solution Place 1 drop into both eyes at bedtime.     Marland Kitchen lisinopril (PRINIVIL,ZESTRIL) 10 MG tablet 1 tablet daily in the evening. 90 tablet 3  . lisinopril (PRINIVIL,ZESTRIL) 5 MG tablet 1 tablet daily in the morning 90 tablet 3  . potassium chloride (K-DUR) 10 MEQ tablet Take 1 tablet (10 mEq total) by mouth daily. 30 tablet 5  . vitamin C (ASCORBIC ACID) 500 MG tablet Take 500 mg by mouth daily.     No current facility-administered medications for this visit.     Review of Systems:     Cardiac Review of Systems: Y or N  Chest Pain [  y  ]  Resting SOB [  n ] Exertional SOB  Blue.Reese  ]  Orthopnea [ y ]   Pedal Edema [ n  ]    Palpitations Blue.Reese  ] Syncope  [n  ]   Presyncope [ y  ]  General Review of  Systems: [Y] = yes [  ]=no Constitional: recent weight change [  ];  Wt loss over the last 3 months [   ] anorexia [  ]; fatigue [  ]; nausea [  ]; night sweats [  ]; fever [  ]; or chills [  ];          Dental: poor dentition[n  ]; Last Dentist visit:   Eye : blurred vision [  ]; diplopia [   ]; vision changes [  ];  Amaurosis fugax[  ]; Resp: cough [  ];  wheezing[n  ];  hemoptysis[n  ]; shortness of breath[  ]; paroxysmal nocturnal dyspnea[  ]; dyspnea on exertion[  ]; or orthopnea[  ];  GI:  gallstones[  ], vomiting[n  ];  dysphagia[  ]; melena[  ];  hematochezia [  ]; heartburn[  ];   Hx of  Colonoscopy[  ]; GU: kidney stones [  ]; hematuria[  ];   dysuria [  ];  nocturia[  ];  history of     obstruction [  ]; urinary frequency [  ]             Skin: rash, swelling[  ];, hair loss[  ];  peripheral edema[  ];  or itching[  ]; Musculosketetal: myalgias[  ];  joint swelling[  ];  joint erythema[  ];  joint pain[  ];  back pain[  ];  Heme/Lymph: bruising[n  ];  bleeding[n  ];  Anemia[yes HCT 33  ];  Neuro: TIA[  ];  headaches[  ];  stroke[  ];  vertigo[  ];  seizures[  ];   paresthesias[  ];  difficulty walking[  ];  Psych:depression[  ]; anxiety[  ];  Endocrine: diabetes[  ];  thyroid dysfunction[  ];  Immunizations: Flu up to date [  ]; Pneumococcal up to date [  ];  Other:  Physical Exam: Ht 5\' 11"  (1.803 m)  Wt 170 lb (77.111 kg)  BMI 23.72 kg/m2  SpO2   PHYSICAL EXAMINATION:  General appearance: alert, cooperative, appears stated age and no distress Neurologic: intact Heart: diastolic murmur: holodiastolic 3/6, decrescendo throughout the precordium Lungs: clear to auscultation bilaterally Abdomen: soft, non-tender; bowel sounds normal; no masses,  no organomegaly Extremities: extremities normal, atraumatic, no cyanosis or edema   Diagnostic Studies & Laboratory data:     Recent Radiology Findings:   Dg Chest 2 View  12/12/2013   CLINICAL DATA:  1-2 month history of shortness  of breath now acutely worse especially with activity  EXAM: CHEST  2 VIEW  COMPARISON:  PA and lateral chest of November 14, 2012  FINDINGS: There are small bilateral pleural effusions blunting the costophrenic angles. Coarse lung markings are present in the lower lobes bilaterally. The cardiac silhouette is enlarged. The pulmonary vascularity is not engorged. The patient has undergone previous median sternotomy and aortic valve replacement. The bony thorax exhibits no acute abnormality. There are 7 intact sternal wires.  IMPRESSION: There are small bilateral pleural effusions with bibasilar atelectasis or possibly pneumonia. There is no pulmonary vascular congestion or interstitial edema.   Electronically Signed   By: Tamaj  Martinique   On: 12/12/2013 17:06      Recent Lab Findings: Lab Results  Component Value Date   WBC 8.1 12/14/2013   HGB 11.3* 12/14/2013   HCT 33.5* 12/14/2013   PLT 146.0* 12/14/2013   GLUCOSE 93 12/14/2013   CHOL 194 03/11/2011   TRIG 41.0 03/11/2011   HDL 61.80 03/11/2011   LDLCALC 124* 03/11/2011   ALT 29 03/11/2011   AST 25 03/11/2011   NA 138 12/14/2013   K 3.7 12/14/2013   CL 110 12/14/2013   CREATININE 1.0 12/14/2013   BUN 18 12/14/2013   CO2 18* 12/14/2013   INR 1.0 12/14/2013   ECHO:12/2013  Study Conclusions  - Left ventricle: The cavity size was severely dilated. Wall thickness was increased in a pattern of mild LVH. Systolic function was moderately to severely reduced. The estimated ejection fraction was in the range of 30% to 35%. Diffuse hypokinesis. - Aortic valve: Calcified and failing prosthetic tissue valve with moderate AR and moderate AS but gradients may be underestimated due to poor EF. Significant change in EF since 2014. - Mitral valve: Calcified annulus. Mildly thickened leaflets . There was mild regurgitation. - Left atrium: The atrium was moderately to severely dilated. - Atrial septum: No defect or patent foramen  ovale was identified. - Pulmonary arteries: PA peak pressure: 45 mm Hg (S). - Pericardium, extracardiac: Large left pleural effusion and small pericardial effusion  Transthoracic echocardiography. M-mode, complete 2D, spectral Doppler, and color Doppler. Birthdate: Patient birthdate: 1940/08/17. Age: Patient is 73 yr old. Sex: Gender: male. BMI: 23.7 kg/m^2. Blood pressure:   134/50 Patient status: Outpatient. Study date: Study date: 12/12/2013. Study time: 04:16 PM. Location: [facility]  -------------------------------------------------------------------  ------------------------------------------------------------------- Left ventricle: The cavity size was severely dilated. Wall thickness was increased in a pattern of mild LVH. Systolic function was moderately to severely reduced. The estimated ejection fraction was in the range of 30% to 35%. Diffuse hypokinesis.  ------------------------------------------------------------------- Aortic valve: Calcified and failing prosthetic tissue valve with moderate AR and moderate AS but gradients may be underestimated due to poor EF. Significant change in EF since 2014. Doppler:   VTI ratio of LVOT to aortic valve: 0.21. Valve area (VTI): 0.86 cm^2. Indexed valve area (VTI): 0.44 cm^2/m^2. Valve area (Vmax): 1.01 cm^2. Indexed valve area (Vmax): 0.51  cm^2/m^2. Mean velocity ratio of LVOT to aortic valve: 0.26. Valve area (Vmean): 1.08 cm^2. Indexed valve area (Vmean): 0.55 cm^2/m^2.  Mean gradient (S): 18 mm Hg. Peak gradient (S): 40 mm Hg.  ------------------------------------------------------------------- Aorta: The aorta was normal, not dilated, and non-diseased.  ------------------------------------------------------------------- Mitral valve:  Calcified annulus. Mildly thickened leaflets . Doppler: There was mild regurgitation.  Peak gradient (D): 6  mm Hg.  ------------------------------------------------------------------- Left atrium: The atrium was moderately to severely dilated.  ------------------------------------------------------------------- Atrial septum: No defect or patent foramen ovale was identified.  ------------------------------------------------------------------- Right ventricle: The cavity size was normal. Wall thickness was normal. Systolic function was normal.  ------------------------------------------------------------------- Pulmonic valve:  Structurally normal valve.  Cusp separation was normal. Doppler: Transvalvular velocity was within the normal range. There was mild regurgitation.  ------------------------------------------------------------------- Tricuspid valve:  Doppler: There was mild regurgitation.  ------------------------------------------------------------------- Right atrium: The atrium was normal in size.  ------------------------------------------------------------------- Pericardium: Large left pleural effusion and small pericardial effusion The pericardium was normal in appearance.  ------------------------------------------------------------------- Post procedure conclusions Ascending Aorta:  - The aorta was normal, not dilated, and non-diseased.  ------------------------------------------------------------------- Measurements  Left ventricle              Value     Reference LV ID, ED, PLAX chordal      (H)   68.2 mm    43 - 52 LV ID, ES, PLAX chordal      (H)   54.2 mm    23 - 38 LV fx shortening, PLAX chordal  (L)   21  %    >=29 LV PW thickness, ED            15.6 mm    --------- IVS/LV PW ratio, ED            0.88      <=1.3 Stroke volume, 2D             51  ml    --------- Stroke volume/bsa, 2D           26  ml/m^2  --------- LV e&',  lateral              4.58 cm/s   --------- LV E/e&', lateral             25.98     --------- LV e&', medial               4.97 cm/s   --------- LV E/e&', medial              23.94     --------- LV e&', average              4.78 cm/s   --------- LV E/e&', average             24.92     ---------  Ventricular septum            Value     Reference IVS thickness, ED             13.7 mm    ---------  LVOT                   Value     Reference LVOT ID, S                23  mm    --------- LVOT area                 4.15 cm^2   --------- LVOT ID  23  mm    --------- LVOT peak velocity, S           77.1 cm/s   --------- LVOT mean velocity, S           50.3 cm/s   --------- LVOT VTI, S                12.2 cm    --------- LVOT peak gradient, S           2   mm Hg  --------- Stroke volume (SV), LVOT DP        50.7 ml    --------- Stroke index (SV/bsa), LVOT DP      25.7 ml/m^2  ---------  Aortic valve               Value     Reference Aortic valve mean velocity, S       194  cm/s   --------- Aortic valve VTI, S            59.2 cm    --------- Aortic mean gradient, S          18  mm Hg  --------- Aortic peak gradient, S          40  mm Hg  --------- VTI ratio, LVOT/AV            0.21      --------- Aortic valve area, VTI          0.86 cm^2   --------- Aortic valve area/bsa, VTI        0.44 cm^2/m^2 --------- Aortic valve area, peak velocity     1.01 cm^2   --------- Aortic valve area/bsa, peak        0.51 cm^2/m^2  --------- velocity Velocity ratio, mean, LVOT/AV       0.26      --------- Aortic valve area, mean velocity     1.08 cm^2   --------- Aortic valve area/bsa, mean        0.55 cm^2/m^2 --------- velocity Aortic regurg pressure half-time     227  ms    ---------  Aorta                   Value     Reference Aortic root ID, ED            35  mm    --------- Ascending aorta ID, A-P, S        32  mm    ---------  Left atrium                Value     Reference LA ID, A-P, ES              54  mm    --------- LA ID/bsa, A-P          (H)   2.74 cm/m^2  <=2.2 LA volume, S               147  ml    --------- LA volume/bsa, S             74.7 ml/m^2  --------- LA volume, ES, 1-p A4C          146  ml    --------- LA volume/bsa, ES, 1-p A4C        74.2 ml/m^2  --------- LA volume, ES, 1-p A2C          144  ml    ---------  LA volume/bsa, ES, 1-p A2C        73.1 ml/m^2  ---------  Mitral valve               Value     Reference Mitral E-wave peak velocity        119  cm/s   --------- Mitral deceleration time         176  ms    150 - 230 Mitral peak gradient, D          6   mm Hg  ---------  Pulmonary arteries            Value     Reference PA pressure, S, DP        (H)   45  mm Hg  <=30  Tricuspid valve              Value     Reference Tricuspid regurg peak velocity      324  cm/s   --------- Tricuspid peak RV-RA gradient       42  mm Hg  ---------  Systemic veins              Value     Reference Estimated CVP               3   mm Hg  ---------  Right ventricle               Value     Reference RV pressure, S, DP        (H)   45  mm Hg  <=30 RV s&', lateral, S             9.16 cm/s   ---------  Legend: (L) and (H) mark values outside specified reference range.  ------------------------------------------------------------------- Prepared and Electronically Authenticated by  Jenkins Rouge, M.D. 2015-12-01T17:13:03  ECHO : one year ago Study Conclusions  - Left ventricle: The cavity size was mildly dilated. Wall thickness was increased in a pattern of mild LVH. Systolic function was normal. The estimated ejection fraction was in the range of 60% to 65%. Wall motion was normal; there were no regional wall motion abnormalities. Features are consistent with a pseudonormal left ventricular filling pattern, with concomitant abnormal relaxation and increased filling pressure (grade 2 diastolic dysfunction). - Aortic valve: A bioprosthesis was present. Moderate regurgitation. - Mitral valve: Calcified annulus. Mildly thickened leaflets . Mild regurgitation. - Left atrium: The atrium was moderately dilated. - Right ventricle: The cavity size was mildly dilated. - Right atrium: The atrium was moderately dilated. - Pulmonary arteries: Systolic pressure was mildly to moderately increased. PA peak pressure: 49mm Hg (S). 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 Transthoracic echocardiography. M-mode, complete 2D, spectral Doppler, and color Doppler. Height: Height: 182.9cm. Height: 72in. Weight: Weight: 77.6kg. Weight: 170.6lb. Body mass index: BMI: 23.2kg/m^2. Body surface area:  BSA: 1.39m^2. Blood pressure:   138/60. Patient status: Outpatient. Location: [facility]  ------------------------------------------------------------  ------------------------------------------------------------ Left  ventricle: The cavity size was mildly dilated. Wall thickness was increased in a pattern of mild LVH. Systolic function was normal. The estimated ejection fraction was in the range of 60% to 65%. Wall motion was normal; there were no regional wall motion abnormalities. Features are consistent with a pseudonormal left ventricular filling pattern, with concomitant abnormal relaxation and increased filling pressure (grade 2 diastolic dysfunction).  ------------------------------------------------------------ Aortic valve:  Moderately thickened leaflets.  A bioprosthesis was present. Mobility was not restricted. Doppler:  Moderate regurgitation.  VTI ratio of LVOT to aortic valve: 0.39. Valve area: 1.24cm^2(VTI). Indexed valve area: 0.62cm^2/m^2 (VTI). Peak velocity ratio of LVOT to aortic valve: 0.36. Valve area: 1.13cm^2 (Vmax). Indexed valve area: 0.57cm^2/m^2 (Vmax).  Mean gradient: 55mm Hg (S). Peak gradient: 77mm Hg (S).  ------------------------------------------------------------ Aorta: Aortic root: The aortic root was normal in size.  ------------------------------------------------------------ Mitral valve:  Calcified annulus. Mildly thickened leaflets . Mobility was not restricted. Doppler: Transvalvular velocity was within the normal range. There was no evidence for stenosis. Mild regurgitation.  Peak gradient: 77mm Hg (D).  ------------------------------------------------------------ Left atrium: The atrium was moderately dilated.  ------------------------------------------------------------ Right ventricle: The cavity size was mildly dilated. Systolic function was normal.  ------------------------------------------------------------ Pulmonic valve:  Doppler: Transvalvular velocity was within the normal range. There was no evidence for stenosis. Mild regurgitation.  ------------------------------------------------------------ Tricuspid valve:   Structurally normal valve.  Doppler: Transvalvular velocity was within the normal range. Mild regurgitation.  ------------------------------------------------------------ Pulmonary artery:  Systolic pressure was mildly to moderately increased.  ------------------------------------------------------------ Right atrium: The atrium was moderately dilated.  ------------------------------------------------------------ Pericardium: There was no pericardial effusion.  ------------------------------------------------------------ Systemic veins: Inferior vena cava: The vessel was normal in size.  ------------------------------------------------------------  2D measurements    Normal Doppler measurements  Normal Left ventricle         Main pulmonary LVID ED,  58.2 mm   43-52  artery chord,             Pressure,  40 mm Hg =30 PLAX              S LVID ES,  37.8 mm   23-38  Left ventricle chord,             Ea, lat  3.73 cm/s  ------ PLAX              ann, tiss FS, chord,  35 %   >29   DP PLAX              E/Ea, lat 31.3    ------ LVPW, ED  12.1 mm   ------ ann, tiss   7 IVS/LVPW  0.93    <1.3  DP ratio, ED           Ea, med  5.33 cm/s  ------ Ventricular septum       ann, tiss IVS, ED  11.2 mm   ------ DP LVOT              E/Ea, med 21.9    ------ Diam, S   20 mm   ------ ann, tiss   5 Area    3.14 cm^2  ------ DP Diam     20 mm   ------ LVOT Aorta             Peak vel,  121 cm/s  ------ Root diam,  35 mm   ------ S ED               VTI, S   30.7 cm   ------ Left atrium          Peak     6 mm Hg ------ AP dim    43 mm   ------ gradient, AP dim   2.16 cm/m^2 <2.2  S index             Stroke vol 96.4 ml   ------  Stroke   48.5 ml/m^2 ------                index                Aortic valve                Peak vel,  336 cm/s  ------                S                Mean vel,  211 cm/s  ------                S                VTI, S    78 cm   ------                Mean     22 mm Hg ------                gradient,                S                Peak     45 mm Hg ------                gradient,                S                VTI ratio 0.39    ------                LVOT/AV                Area, VTI 1.24 cm^2  ------                Area index 0.62 cm^2/m ------                (VTI)      ^2                Peak vel  0.36    ------                ratio,                LVOT/AV                Area, Vmax 1.13 cm^2  ------                Area index 0.57 cm^2/m ------                (Vmax)     ^2                Regurg PHT 369 ms   ------                Mitral valve                Peak E vel 117 cm/s  ------                Peak A vel 47.2 cm/s  ------                Decelerati 338 ms   150-23                on time        0  Peak     5 mm Hg ------                gradient,                D                Peak E/A  2.5    ------                ratio                Tricuspid valve                Regurg   297 cm/s   ------                peak vel                Peak RV-RA  35 mm Hg ------                gradient,                S                Systemic veins                Estimated   5 mm Hg ------                CVP                Right ventricle                Pressure,  40 mm Hg <30                S                Sa vel,  13.9 cm/s  ------                lat ann,   3                tiss DP  ------------------------------------------------------------ Prepared and Electronically Authenticated by  Kirk Ruths 2014-11-03T09:57:42.277  CATH: Name: [name] [name] MRN: [id number] DOB: March 04, 1940  Procedure: Right Heart Cath, Selective Coronary Angiography, aortic root angiography  Indication: 73 yo WM with 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 EF 30-35% by Echo.   Procedural Details: The right wrist was prepped, draped, and anesthetized with 1% lidocaine. Using the modified Seldinger technique a 6 Fr slender sheath was placed in the right radial artery. Attempted to exchange the brachial IV catheter with a sheath but the IV was in a side branch and the sheath could not be advanced. The right groin was prepped and anesthetized with 1% lidocaine and a 7 Fr. Venous sheath was placed via the right femoral vein. A Swan-Ganz catheter was used for the right heart catheterization. Standard protocol was followed for recording of right heart pressures and sampling of oxygen saturations. Fick cardiac output was calculated. Standard Judkins catheters were used for selective coronary angiography and aortic root angiography. There were no immediate procedural complications. The patient was transferred to the post  catheterization recovery area for further monitoring.  Procedural Findings: Hemodynamics RA 9/8 mean 5 mm Hg RV 48/10 mm Hg PA 45/16 mean 28 mm Hg  PCWP 19/34 mean 21 mm Hg, prominent V waves. LV N/A, The AV was not crossed. AO 102/45 mean  66 mm Hg  Oxygen saturations: PA 66% AO 99%  Cardiac Output (Fick) 5.2 L/min  Cardiac Index (Fick) 2.62 L/min/meter squared  Coronary angiography: Coronary dominance: right  Left mainstem: Short without significant disease.   Left anterior descending (LAD): The LAD is moderately calcified. There is an 80-90% stenosis in the proximal vessel followed by diffuse disease in the mid vessel up to 80-90%  Left circumflex (LCx): The LCx gives off a single large OM branch. It is without significant disease.  Right coronary artery (RCA): The RCA is diffusely diseased in the proximal to mid vessel up to 30%. There is a focal 50% stenosis at the takeoff of a large RV branch. At the crux there is an 80-90% stenosis.   Left ventriculography: The AV was not crossed.  Aortic root angiography: There is dilation of the proximal aorta. There is severe aortic insufficiency. LV is enlarged with global hypokinesis and moderate to severe LV dysfunction.  Final Conclusions:  1. Severe 2 vessel obstructive CAD 2. Severe aortic valve insufficiency. 3. LV dysfunction 4. Proximal aortic enlargement. 5. Mild pulmonary HTN. 6. Elevated LV filling pressures. Prominent V waves may represent poor LV compliance or mitral insufficiency. Echo showed only mild MR.   Recommendations: Refer to CT surgery for AVR and CABG. Will need to assess MV carefully at the time of surgery with TEE.   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 LV dysfunction Proximal aortic enlargement. Mild pulmonary HTN. Anemia, iron deficiency  I have recommend to proceed with redo AVR and  CABG. Plan preop CTA of chest to evaluate size of aorta. Risk and options discussed with patient. Will need full GI evaluation postop, with age , iron defiency anemia have recommended use of tissue valve         I spent 55 minutes counseling the patient face to face. The total time spent in the appointment was 80 minutes.  Grace Isaac MD      [address] [place] [address] Office [phone]   Beeper [phone]  12/20/2013 4:01 PM

## 2013-12-20 NOTE — Telephone Encounter (Deleted)
-----   Message from Darlin Coco, MD sent at 12/14/2013 10:04 PM EST ----- Please report to patient.  The recent labs are stable. Continue same medication and careful diet. There is mild anemia. The potassium is low normal. Add KCl 10 meq one daily. Also arrange for him to get serum iron and TIBC and retic count.

## 2013-12-20 NOTE — Telephone Encounter (Signed)
Noted  

## 2013-12-20 NOTE — Telephone Encounter (Signed)
New Msg   Pt wife Derry Skill calling, wants to inform that pt has appt with Dr. Lanelle Bal today. Please contact at [phone] if any questions.

## 2013-12-20 NOTE — Telephone Encounter (Signed)
Patient coming for labs today

## 2013-12-20 NOTE — Patient Instructions (Signed)
Stop lisinopril 36 hours before surgery Decrease  Asa to 81 mg day

## 2013-12-21 ENCOUNTER — Telehealth: Payer: Self-pay | Admitting: Cardiology

## 2013-12-21 ENCOUNTER — Other Ambulatory Visit: Payer: Self-pay | Admitting: *Deleted

## 2013-12-21 ENCOUNTER — Other Ambulatory Visit: Payer: Self-pay | Admitting: Cardiothoracic Surgery

## 2013-12-21 ENCOUNTER — Encounter (HOSPITAL_COMMUNITY): Payer: Self-pay | Admitting: Cardiology

## 2013-12-21 DIAGNOSIS — I2510 Atherosclerotic heart disease of native coronary artery without angina pectoris: Secondary | ICD-10-CM

## 2013-12-21 DIAGNOSIS — I350 Nonrheumatic aortic (valve) stenosis: Secondary | ICD-10-CM

## 2013-12-21 DIAGNOSIS — I351 Nonrheumatic aortic (valve) insufficiency: Secondary | ICD-10-CM

## 2013-12-21 NOTE — Telephone Encounter (Signed)
Ryan calling in f/u regarding note in Epic of pt being referred to GI for Hbg.  Advised that message was left yesterday for pt but he didn't return call to advise of low Hgb.  Pt is scheduled for surgery 12/16 with Dr. Servando Snare for redo CABG. AVR. She spoke w/Dr. Servando Snare who stated that he didn't need GI workup but to let pt know of low Hbg.Bonne Dolores to call pt but left message for him to call us regarding labs.  Will route to Dr. Mare Ferrari and Rip Harbour Pratt,LPN.

## 2013-12-21 NOTE — Telephone Encounter (Signed)
New Message        Dr. Servando Snare is doing sx on pt 12/27/13 for redo cabbage avr, stating there is a note in epic about GI workup and wanting to know where we are at in the process of the workup. Please call back and advise.

## 2013-12-22 NOTE — Telephone Encounter (Signed)
Did confirm with Thurmond Butts Dr Servando Snare will proceed with procedure as long as patient aware of results Advised wife

## 2013-12-22 NOTE — Telephone Encounter (Signed)
Follow Up        Pt's wife returning phone call about labs. Please call back and advise.

## 2013-12-25 ENCOUNTER — Other Ambulatory Visit: Payer: Medicare Other

## 2013-12-25 ENCOUNTER — Ambulatory Visit
Admission: RE | Admit: 2013-12-25 | Discharge: 2013-12-25 | Disposition: A | Discharge status: Home / Self Care | Payer: Medicare Other | Source: Ambulatory Visit | Attending: Cardiothoracic Surgery | Admitting: Cardiothoracic Surgery

## 2013-12-25 DIAGNOSIS — I350 Nonrheumatic aortic (valve) stenosis: Secondary | ICD-10-CM

## 2013-12-25 MED ORDER — IOHEXOL 350 MG/ML SOLN
80.0000 mL | Freq: Once | INTRAVENOUS | Status: AC | PRN
  Administered 2013-12-25: 80 mL via INTRAVENOUS

## 2013-12-25 NOTE — Telephone Encounter (Signed)
Thank you for confirming regarding the GI consult.  We will plan to do it postoperatively if necessary.

## 2013-12-26 ENCOUNTER — Ambulatory Visit (HOSPITAL_COMMUNITY)
Admission: RE | Admit: 2013-12-26 | Discharge: 2013-12-26 | Disposition: A | Discharge status: Home / Self Care | Payer: Medicare Other | Source: Ambulatory Visit | Attending: Cardiothoracic Surgery | Admitting: Cardiothoracic Surgery

## 2013-12-26 ENCOUNTER — Encounter (HOSPITAL_COMMUNITY)
Admission: RE | Admit: 2013-12-26 | Discharge: 2013-12-26 | Disposition: A | Discharge status: Home / Self Care | Payer: Medicare Other | Source: Ambulatory Visit | Attending: Cardiothoracic Surgery | Admitting: Cardiothoracic Surgery

## 2013-12-26 ENCOUNTER — Ambulatory Visit (HOSPITAL_COMMUNITY): Payer: Medicare Other

## 2013-12-26 ENCOUNTER — Encounter (HOSPITAL_COMMUNITY): Payer: Self-pay

## 2013-12-26 VITALS — BP 126/33 | HR 56 | Temp 97.5°F | Resp 18 | Ht 71.0 in | Wt 159.6 lb

## 2013-12-26 DIAGNOSIS — Z01810 Encounter for preprocedural cardiovascular examination: Secondary | ICD-10-CM

## 2013-12-26 DIAGNOSIS — I351 Nonrheumatic aortic (valve) insufficiency: Secondary | ICD-10-CM

## 2013-12-26 DIAGNOSIS — I517 Cardiomegaly: Secondary | ICD-10-CM

## 2013-12-26 DIAGNOSIS — I2510 Atherosclerotic heart disease of native coronary artery without angina pectoris: Secondary | ICD-10-CM

## 2013-12-26 DIAGNOSIS — J8410 Pulmonary fibrosis, unspecified: Secondary | ICD-10-CM

## 2013-12-26 HISTORY — DX: Heart failure, unspecified: I50.9

## 2013-12-26 LAB — COMPREHENSIVE METABOLIC PANEL
ALT: 17 U/L (ref 0–53)
AST: 27 U/L (ref 0–37)
Albumin: 4.1 g/dL (ref 3.5–5.2)
Alkaline Phosphatase: 86 U/L (ref 39–117)
Anion gap: 17 — ABNORMAL HIGH (ref 5–15)
BUN: 35 mg/dL — ABNORMAL HIGH (ref 6–23)
CO2: 14 mEq/L — ABNORMAL LOW (ref 19–32)
Calcium: 9.5 mg/dL (ref 8.4–10.5)
Chloride: 102 mEq/L (ref 96–112)
Creatinine, Ser: 1.01 mg/dL (ref 0.50–1.35)
GFR calc Af Amer: 83 mL/min — ABNORMAL LOW (ref >90)
GFR calc non Af Amer: 72 mL/min — ABNORMAL LOW (ref >90)
Glucose, Bld: 108 mg/dL — ABNORMAL HIGH (ref 70–99)
Potassium: 4.9 mEq/L (ref 3.7–5.3)
Sodium: 133 mEq/L — ABNORMAL LOW (ref 137–147)
Total Bilirubin: 0.5 mg/dL (ref 0.3–1.2)
Total Protein: 7 g/dL (ref 6.0–8.3)

## 2013-12-26 LAB — BLOOD GAS, ARTERIAL
Acid-base deficit: 4 mmol/L — ABNORMAL HIGH (ref 0.0–2.0)
Bicarbonate: 19.6 mEq/L — ABNORMAL LOW (ref 20.0–24.0)
Drawn by: 428831
FIO2: 0.21 %
O2 Saturation: 99.2 %
Patient temperature: 98.6
TCO2: 20.5 mmol/L (ref 0–100)
pCO2 arterial: 30.2 mmHg — ABNORMAL LOW (ref 35.0–45.0)
pH, Arterial: 7.427 (ref 7.350–7.450)
pO2, Arterial: 114 mmHg — ABNORMAL HIGH (ref 80.0–100.0)

## 2013-12-26 LAB — PULMONARY FUNCTION TEST
DL/VA % pred: 75 %
DL/VA: 3.52 ml/min/mmHg/L
DLCO cor % pred: 70 %
DLCO cor: 23.79 ml/min/mmHg
DLCO unc % pred: 70 %
DLCO unc: 23.79 ml/min/mmHg
FEF 25-75 Post: 3.77 L/sec
FEF 25-75 Pre: 2.64 L/sec
FEF2575-%Change-Post: 42 %
FEF2575-%Pred-Post: 156 %
FEF2575-%Pred-Pre: 109 %
FEV1-%Change-Post: 10 %
FEV1-%Pred-Post: 111 %
FEV1-%Pred-Pre: 100 %
FEV1-Post: 3.62 L
FEV1-Pre: 3.26 L
FEV1FVC-%Change-Post: 3 %
FEV1FVC-%Pred-Pre: 99 %
FEV6-%Change-Post: 7 %
FEV6-%Pred-Post: 114 %
FEV6-%Pred-Pre: 105 %
FEV6-Post: 4.8 L
FEV6-Pre: 4.46 L
FEV6FVC-%Change-Post: 0 %
FEV6FVC-%Pred-Post: 105 %
FEV6FVC-%Pred-Pre: 105 %
FVC-%Change-Post: 7 %
FVC-%Pred-Post: 107 %
FVC-%Pred-Pre: 100 %
FVC-Post: 4.83 L
FVC-Pre: 4.5 L
Post FEV1/FVC ratio: 75 %
Post FEV6/FVC ratio: 99 %
Pre FEV1/FVC ratio: 72 %
Pre FEV6/FVC Ratio: 99 %
RV % pred: 145 %
RV: 3.73 L
TLC % pred: 111 %
TLC: 8.1 L

## 2013-12-26 LAB — URINALYSIS, ROUTINE W REFLEX MICROSCOPIC
Bilirubin Urine: NEGATIVE
Glucose, UA: NEGATIVE mg/dL
Hgb urine dipstick: NEGATIVE
Ketones, ur: NEGATIVE mg/dL
Leukocytes, UA: NEGATIVE
Nitrite: NEGATIVE
Protein, ur: NEGATIVE mg/dL
Specific Gravity, Urine: 1.012 (ref 1.005–1.030)
Urobilinogen, UA: 0.2 mg/dL (ref 0.0–1.0)
pH: 5 (ref 5.0–8.0)

## 2013-12-26 LAB — PROTIME-INR
INR: 1.02 (ref 0.00–1.49)
Prothrombin Time: 13.5 seconds (ref 11.6–15.2)

## 2013-12-26 LAB — HEMOGLOBIN A1C
Hgb A1c MFr Bld: 5.4 % (ref <5.7)
Mean Plasma Glucose: 108 mg/dL (ref <117)

## 2013-12-26 LAB — APTT: aPTT: 26 seconds (ref 24–37)

## 2013-12-26 LAB — SURGICAL PCR SCREEN
MRSA, PCR: NEGATIVE
Staphylococcus aureus: POSITIVE — AB

## 2013-12-26 MED ORDER — PAPAVERINE HCL 30 MG/ML IJ SOLN
INTRAMUSCULAR | Status: AC
  Administered 2013-12-27: 500 mL
  Filled 2013-12-26: qty 2.5

## 2013-12-26 MED ORDER — SODIUM CHLORIDE 0.9 % IV SOLN
INTRAVENOUS | Status: DC
  Filled 2013-12-26: qty 30

## 2013-12-26 MED ORDER — DEXTROSE 5 % IV SOLN
750.0000 mg | INTRAVENOUS | Status: DC
  Filled 2013-12-26: qty 750

## 2013-12-26 MED ORDER — EPINEPHRINE HCL 1 MG/ML IJ SOLN
0.0000 ug/min | INTRAVENOUS | Status: DC
  Filled 2013-12-26: qty 4

## 2013-12-26 MED ORDER — CHLORHEXIDINE GLUCONATE 4 % EX LIQD
30.0000 mL | CUTANEOUS | Status: DC
Start: 2013-12-26 — End: 2013-12-27

## 2013-12-26 MED ORDER — DEXTROSE 5 % IV SOLN
1.5000 g | INTRAVENOUS | Status: AC
  Administered 2013-12-27: 1.5 g via INTRAVENOUS
  Filled 2013-12-26: qty 1.5

## 2013-12-26 MED ORDER — DEXMEDETOMIDINE HCL IN NACL 400 MCG/100ML IV SOLN
0.1000 ug/kg/h | INTRAVENOUS | Status: AC
Start: 2013-12-27 — End: 2013-12-27
  Administered 2013-12-27: 0.2 ug/kg/h via INTRAVENOUS
  Filled 2013-12-26: qty 100

## 2013-12-26 MED ORDER — SODIUM CHLORIDE 0.9 % IV SOLN
INTRAVENOUS | Status: AC
  Administered 2013-12-27: 70 mL via INTRAVENOUS
  Filled 2013-12-26: qty 40

## 2013-12-26 MED ORDER — SODIUM CHLORIDE 0.9 % IV SOLN
INTRAVENOUS | Status: AC
  Administered 2013-12-27: 1.3 [IU]/h via INTRAVENOUS
  Filled 2013-12-26: qty 2.5

## 2013-12-26 MED ORDER — POTASSIUM CHLORIDE 2 MEQ/ML IV SOLN
80.0000 meq | INTRAVENOUS | Status: DC
  Filled 2013-12-26: qty 40

## 2013-12-26 MED ORDER — DOPAMINE-DEXTROSE 3.2-5 MG/ML-% IV SOLN
0.0000 ug/kg/min | INTRAVENOUS | Status: AC
  Administered 2013-12-27: 3 ug/kg/min via INTRAVENOUS
  Filled 2013-12-26: qty 250

## 2013-12-26 MED ORDER — SODIUM CHLORIDE 0.9 % IV SOLN
1250.0000 mg | INTRAVENOUS | Status: AC
  Administered 2013-12-27: 1250 mg via INTRAVENOUS
  Filled 2013-12-26 (×2): qty 1250

## 2013-12-26 MED ORDER — MAGNESIUM SULFATE 50 % IJ SOLN
40.0000 meq | INTRAMUSCULAR | Status: DC
  Filled 2013-12-26: qty 10

## 2013-12-26 MED ORDER — NITROGLYCERIN IN D5W 200-5 MCG/ML-% IV SOLN
2.0000 ug/min | INTRAVENOUS | Status: AC
  Administered 2013-12-27: 5 ug/min via INTRAVENOUS
  Filled 2013-12-26: qty 250

## 2013-12-26 MED ORDER — ALBUTEROL SULFATE (2.5 MG/3ML) 0.083% IN NEBU
2.5000 mg | INHALATION_SOLUTION | Freq: Once | RESPIRATORY_TRACT | Status: AC
  Administered 2013-12-26: 2.5 mg via RESPIRATORY_TRACT

## 2013-12-26 MED ORDER — PHENYLEPHRINE HCL 10 MG/ML IJ SOLN
30.0000 ug/min | INTRAVENOUS | Status: AC
  Administered 2013-12-27: 50 ug/min via INTRAVENOUS
  Filled 2013-12-26: qty 2

## 2013-12-26 MED ORDER — METOPROLOL TARTRATE 12.5 MG HALF TABLET
12.5000 mg | ORAL_TABLET | Freq: Once | ORAL | Status: DC
  Filled 2013-12-26: qty 1

## 2013-12-26 NOTE — Progress Notes (Signed)
VASCULAR LAB PRELIMINARY  PRELIMINARY  PRELIMINARY  PRELIMINARY  Pre-op Cardiac Surgery  Carotid Findings:  Bilateral:  1-39% ICA stenosis.  Vertebral artery flow is antegrade.     Upper Extremity Right Left  Brachial Pressures 119 Triphasic 131 Triphasic  Radial Waveforms Triphasic Triphasic  Ulnar Waveforms Triphasic Triphasic  Palmar Arch (Allen's Test) Normal Normal   Findings:  Doppler waveforms remained normal bilaterally with both radial and ulnar compressions    Lower  Extremity Right Left  Dorsalis Pedis    Anterior Tibial    Posterior Tibial    Ankle/Brachial Indices      Findings:  Palpable pedal pulses bilaterally   [name] [name], RVS 12/26/2013, 1:10 PM

## 2013-12-26 NOTE — Progress Notes (Signed)
Cardiologist is [place] note in epic  Echo reports in epic from 2014/2015  Denies ever having a stress test  Heart cath in epic from 2005/2015  PFT done at 1100 today  Dopplers done at 1200 today  EKG in epic from 12-18-13  Sees PA that is in with Dr.Maura Hamrick

## 2013-12-26 NOTE — Progress Notes (Signed)
Sleep study in epic from 2008/2015 not confirmed to have Sleep Apnea

## 2013-12-26 NOTE — Pre-Procedure Instructions (Signed)
[name] [name]  12/26/2013   Your procedure is scheduled on:  WED, DEC 16 @ 8:30  AM  Report to [facility]  at 6:30 AM.  Call this number if you have problems the morning of surgery: [phone]   Remember:   Do not eat food or drink liquids after midnight.   Take these medicines the morning of surgery with A SIP OF WATER: EYE DROPS              NO GOODY'S,BC'S,ALEVE,ASPIRIN,IBUPROFEN,FISH OIL,OR ANY HERBAL MEDICATIONS   Do not wear jewelry  Do not wear lotions, powders, or colognes.   Men may shave face and neck.  Do not bring valuables to the hospital.  [facility] is not responsible                  for any belongings or valuables.               Contacts, dentures or bridgework may not be worn into surgery.  Leave suitcase in the car. After surgery it may be brought to your room.  For patients admitted to the hospital, discharge time is determined by your                treatment team.                  Special Instructions:  [place] - Preparing for Surgery  Before surgery, you can play an important role.  Because skin is not sterile, your skin needs to be as free of germs as possible.  You can reduce the number of germs on you skin by washing with CHG (chlorahexidine gluconate) soap before surgery.  CHG is an antiseptic cleaner which kills germs and bonds with the skin to continue killing germs even after washing.  Please DO NOT use if you have an allergy to CHG or antibacterial soaps.  If your skin becomes reddened/irritated stop using the CHG and inform your nurse when you arrive at Short Stay.  Do not shave (including legs and underarms) for at least 48 hours prior to the first CHG shower.  You may shave your face.  Please follow these instructions carefully:   1.  Shower with CHG Soap the night before surgery and the                                morning of Surgery.  2.  If you choose to wash your hair, wash your hair first as usual with your       normal  shampoo.  3.  After you shampoo, rinse your hair and body thoroughly to remove the                      Shampoo.  4.  Use CHG as you would any other liquid soap.  You can apply chg directly       to the skin and wash gently with scrungie or a clean washcloth.  5.  Apply the CHG Soap to your body ONLY FROM THE NECK DOWN.        Do not use on open wounds or open sores.  Avoid contact with your eyes,       ears, mouth and genitals (private parts).  Wash genitals (private parts)       with your normal soap.  6.  Wash thoroughly, paying special attention to  the area where your surgery        will be performed.  7.  Thoroughly rinse your body with warm water from the neck down.  8.  DO NOT shower/wash with your normal soap after using and rinsing off       the CHG Soap.  9.  Pat yourself dry with a clean towel.            10.  Wear clean pajamas.            11.  Place clean sheets on your bed the night of your first shower and do not        sleep with pets.  Day of Surgery  Do not apply any lotions/deoderants the morning of surgery.  Please wear clean clothes to the hospital/surgery center.     Please read over the following fact sheets that you were given: Pain Booklet, Coughing and Deep Breathing, Blood Transfusion Information, MRSA Information and Surgical Site Infection Prevention

## 2013-12-27 ENCOUNTER — Inpatient Hospital Stay (HOSPITAL_COMMUNITY): Payer: Medicare Other

## 2013-12-27 ENCOUNTER — Inpatient Hospital Stay (HOSPITAL_COMMUNITY): Payer: Medicare Other | Admitting: Anesthesiology

## 2013-12-27 ENCOUNTER — Inpatient Hospital Stay (HOSPITAL_COMMUNITY)
Admission: RE | Admit: 2013-12-27 | Discharge: 2014-01-03 | DRG: 220 | Disposition: A | Discharge status: Home Health | Payer: Medicare Other | Source: Ambulatory Visit | Attending: Cardiothoracic Surgery | Admitting: Cardiothoracic Surgery

## 2013-12-27 ENCOUNTER — Encounter (HOSPITAL_COMMUNITY): Payer: Self-pay | Admitting: Anesthesiology

## 2013-12-27 ENCOUNTER — Encounter (HOSPITAL_COMMUNITY)
Admission: RE | Disposition: A | Discharge status: Home Health | Payer: Medicare Other | Source: Ambulatory Visit | Attending: Cardiothoracic Surgery

## 2013-12-27 DIAGNOSIS — Z8249 Family history of ischemic heart disease and other diseases of the circulatory system: Secondary | ICD-10-CM

## 2013-12-27 DIAGNOSIS — G4730 Sleep apnea, unspecified: Secondary | ICD-10-CM | POA: Diagnosis present

## 2013-12-27 DIAGNOSIS — I350 Nonrheumatic aortic (valve) stenosis: Secondary | ICD-10-CM | POA: Diagnosis present

## 2013-12-27 DIAGNOSIS — Z7982 Long term (current) use of aspirin: Secondary | ICD-10-CM

## 2013-12-27 DIAGNOSIS — E785 Hyperlipidemia, unspecified: Secondary | ICD-10-CM | POA: Diagnosis present

## 2013-12-27 DIAGNOSIS — J95811 Postprocedural pneumothorax: Secondary | ICD-10-CM | POA: Diagnosis not present

## 2013-12-27 DIAGNOSIS — I351 Nonrheumatic aortic (valve) insufficiency: Secondary | ICD-10-CM

## 2013-12-27 DIAGNOSIS — R339 Retention of urine, unspecified: Secondary | ICD-10-CM | POA: Diagnosis not present

## 2013-12-27 DIAGNOSIS — I2510 Atherosclerotic heart disease of native coronary artery without angina pectoris: Secondary | ICD-10-CM | POA: Diagnosis present

## 2013-12-27 DIAGNOSIS — Z952 Presence of prosthetic heart valve: Secondary | ICD-10-CM

## 2013-12-27 DIAGNOSIS — Z9689 Presence of other specified functional implants: Secondary | ICD-10-CM

## 2013-12-27 DIAGNOSIS — I352 Nonrheumatic aortic (valve) stenosis with insufficiency: Secondary | ICD-10-CM | POA: Diagnosis present

## 2013-12-27 DIAGNOSIS — Y838 Other surgical procedures as the cause of abnormal reaction of the patient, or of later complication, without mention of misadventure at the time of the procedure: Secondary | ICD-10-CM | POA: Diagnosis not present

## 2013-12-27 DIAGNOSIS — Z79899 Other long term (current) drug therapy: Secondary | ICD-10-CM | POA: Diagnosis not present

## 2013-12-27 DIAGNOSIS — K5900 Constipation, unspecified: Secondary | ICD-10-CM | POA: Diagnosis not present

## 2013-12-27 DIAGNOSIS — Y92230 Patient room in hospital as the place of occurrence of the external cause: Secondary | ICD-10-CM | POA: Diagnosis not present

## 2013-12-27 DIAGNOSIS — I4891 Unspecified atrial fibrillation: Secondary | ICD-10-CM | POA: Diagnosis not present

## 2013-12-27 DIAGNOSIS — I440 Atrioventricular block, first degree: Secondary | ICD-10-CM | POA: Diagnosis not present

## 2013-12-27 DIAGNOSIS — I272 Other secondary pulmonary hypertension: Secondary | ICD-10-CM | POA: Diagnosis present

## 2013-12-27 DIAGNOSIS — R410 Disorientation, unspecified: Secondary | ICD-10-CM | POA: Diagnosis not present

## 2013-12-27 DIAGNOSIS — I509 Heart failure, unspecified: Secondary | ICD-10-CM | POA: Diagnosis present

## 2013-12-27 DIAGNOSIS — I9789 Other postprocedural complications and disorders of the circulatory system, not elsewhere classified: Secondary | ICD-10-CM | POA: Diagnosis not present

## 2013-12-27 DIAGNOSIS — J939 Pneumothorax, unspecified: Secondary | ICD-10-CM

## 2013-12-27 DIAGNOSIS — H409 Unspecified glaucoma: Secondary | ICD-10-CM | POA: Diagnosis present

## 2013-12-27 DIAGNOSIS — D62 Acute posthemorrhagic anemia: Secondary | ICD-10-CM | POA: Diagnosis not present

## 2013-12-27 DIAGNOSIS — T82857A Stenosis of cardiac prosthetic devices, implants and grafts, initial encounter: Secondary | ICD-10-CM | POA: Diagnosis present

## 2013-12-27 DIAGNOSIS — D696 Thrombocytopenia, unspecified: Secondary | ICD-10-CM | POA: Diagnosis not present

## 2013-12-27 DIAGNOSIS — R911 Solitary pulmonary nodule: Secondary | ICD-10-CM | POA: Insufficient documentation

## 2013-12-27 HISTORY — PX: TEE WITHOUT CARDIOVERSION: SHX5443

## 2013-12-27 HISTORY — PX: AORTIC VALVE REPLACEMENT: SHX41

## 2013-12-27 HISTORY — PX: CORONARY ARTERY BYPASS GRAFT: SHX141

## 2013-12-27 LAB — POCT I-STAT, CHEM 8
BUN: 30 mg/dL — ABNORMAL HIGH (ref 6–23)
BUN: 25 mg/dL — ABNORMAL HIGH (ref 6–23)
BUN: 28 mg/dL — ABNORMAL HIGH (ref 6–23)
BUN: 25 mg/dL — ABNORMAL HIGH (ref 6–23)
BUN: 24 mg/dL — ABNORMAL HIGH (ref 6–23)
Calcium, Ion: 1.24 mmol/L (ref 1.13–1.30)
Calcium, Ion: 1.06 mmol/L — ABNORMAL LOW (ref 1.13–1.30)
Calcium, Ion: 1.26 mmol/L (ref 1.13–1.30)
Calcium, Ion: 1.13 mmol/L (ref 1.13–1.30)
Calcium, Ion: 1.27 mmol/L (ref 1.13–1.30)
Chloride: 104 mEq/L (ref 96–112)
Chloride: 104 mEq/L (ref 96–112)
Chloride: 99 mEq/L (ref 96–112)
Chloride: 103 mEq/L (ref 96–112)
Chloride: 102 mEq/L (ref 96–112)
Creatinine, Ser: 0.6 mg/dL (ref 0.50–1.35)
Creatinine, Ser: 0.6 mg/dL (ref 0.50–1.35)
Creatinine, Ser: 0.6 mg/dL (ref 0.50–1.35)
Creatinine, Ser: 0.6 mg/dL (ref 0.50–1.35)
Creatinine, Ser: 0.7 mg/dL (ref 0.50–1.35)
Glucose, Bld: 104 mg/dL — ABNORMAL HIGH (ref 70–99)
Glucose, Bld: 93 mg/dL (ref 70–99)
Glucose, Bld: 94 mg/dL (ref 70–99)
Glucose, Bld: 143 mg/dL — ABNORMAL HIGH (ref 70–99)
Glucose, Bld: 166 mg/dL — ABNORMAL HIGH (ref 70–99)
HCT: 32 % — ABNORMAL LOW (ref 39.0–52.0)
HCT: 22 % — ABNORMAL LOW (ref 39.0–52.0)
HCT: 28 % — ABNORMAL LOW (ref 39.0–52.0)
HCT: 26 % — ABNORMAL LOW (ref 39.0–52.0)
HCT: 22 % — ABNORMAL LOW (ref 39.0–52.0)
Hemoglobin: 10.9 g/dL — ABNORMAL LOW (ref 13.0–17.0)
Hemoglobin: 7.5 g/dL — ABNORMAL LOW (ref 13.0–17.0)
Hemoglobin: 9.5 g/dL — ABNORMAL LOW (ref 13.0–17.0)
Hemoglobin: 8.8 g/dL — ABNORMAL LOW (ref 13.0–17.0)
Hemoglobin: 7.5 g/dL — ABNORMAL LOW (ref 13.0–17.0)
Potassium: 4.5 mEq/L (ref 3.7–5.3)
Potassium: 4.7 mEq/L (ref 3.7–5.3)
Potassium: 4.8 mEq/L (ref 3.7–5.3)
Potassium: 5.4 mEq/L — ABNORMAL HIGH (ref 3.7–5.3)
Potassium: 4.6 mEq/L (ref 3.7–5.3)
Sodium: 137 mEq/L (ref 137–147)
Sodium: 133 mEq/L — ABNORMAL LOW (ref 137–147)
Sodium: 137 mEq/L (ref 137–147)
Sodium: 135 mEq/L — ABNORMAL LOW (ref 137–147)
Sodium: 134 mEq/L — ABNORMAL LOW (ref 137–147)
TCO2: 23 mmol/L (ref 0–100)
TCO2: 22 mmol/L (ref 0–100)
TCO2: 22 mmol/L (ref 0–100)
TCO2: 20 mmol/L (ref 0–100)
TCO2: 21 mmol/L (ref 0–100)

## 2013-12-27 LAB — CBC
HCT: 20.9 % — ABNORMAL LOW (ref 39.0–52.0)
Hemoglobin: 7 g/dL — ABNORMAL LOW (ref 13.0–17.0)
MCH: 28.5 pg (ref 26.0–34.0)
MCHC: 33.5 g/dL (ref 30.0–36.0)
MCV: 85 fL (ref 78.0–100.0)
Platelets: 62 10*3/uL — ABNORMAL LOW (ref 150–400)
RBC: 2.46 MIL/uL — ABNORMAL LOW (ref 4.22–5.81)
RDW: 13.3 % (ref 11.5–15.5)
WBC: 13.2 10*3/uL — ABNORMAL HIGH (ref 4.0–10.5)

## 2013-12-27 LAB — POCT I-STAT 3, ART BLOOD GAS (G3+)
Acid-base deficit: 6 mmol/L — ABNORMAL HIGH (ref 0.0–2.0)
Bicarbonate: 24.8 mEq/L — ABNORMAL HIGH (ref 20.0–24.0)
Bicarbonate: 20.2 mEq/L (ref 20.0–24.0)
O2 Saturation: 100 %
O2 Saturation: 98 %
Patient temperature: 36
TCO2: 26 mmol/L (ref 0–100)
TCO2: 21 mmol/L (ref 0–100)
pCO2 arterial: 42.7 mmHg (ref 35.0–45.0)
pCO2 arterial: 39.6 mmHg (ref 35.0–45.0)
pH, Arterial: 7.373 (ref 7.350–7.450)
pH, Arterial: 7.311 — ABNORMAL LOW (ref 7.350–7.450)
pO2, Arterial: 328 mmHg — ABNORMAL HIGH (ref 80.0–100.0)
pO2, Arterial: 101 mmHg — ABNORMAL HIGH (ref 80.0–100.0)

## 2013-12-27 LAB — PLATELET COUNT: Platelets: 90 10*3/uL — ABNORMAL LOW (ref 150–400)

## 2013-12-27 LAB — FIBRINOGEN: Fibrinogen: 199 mg/dL — ABNORMAL LOW (ref 204–475)

## 2013-12-27 LAB — CREATININE, SERUM
Creatinine, Ser: 0.74 mg/dL (ref 0.50–1.35)
GFR calc Af Amer: >90 mL/min (ref >90)
GFR calc non Af Amer: 89 mL/min — ABNORMAL LOW (ref >90)

## 2013-12-27 LAB — POCT I-STAT 4, (NA,K, GLUC, HGB,HCT)
Glucose, Bld: 114 mg/dL — ABNORMAL HIGH (ref 70–99)
HCT: 25 % — ABNORMAL LOW (ref 39.0–52.0)
Hemoglobin: 8.5 g/dL — ABNORMAL LOW (ref 13.0–17.0)
Potassium: 4.3 mEq/L (ref 3.7–5.3)
Sodium: 140 mEq/L (ref 137–147)

## 2013-12-27 LAB — HEMOGLOBIN AND HEMATOCRIT, BLOOD
HCT: 24.2 % — ABNORMAL LOW (ref 39.0–52.0)
Hemoglobin: 8.4 g/dL — ABNORMAL LOW (ref 13.0–17.0)

## 2013-12-27 LAB — POCT I-STAT GLUCOSE
Glucose, Bld: 96 mg/dL (ref 70–99)
Glucose, Bld: 116 mg/dL — ABNORMAL HIGH (ref 70–99)
Glucose, Bld: 135 mg/dL — ABNORMAL HIGH (ref 70–99)
Operator id: 238531
Operator id: 3406
Operator id: 3406

## 2013-12-27 LAB — PREPARE RBC (CROSSMATCH)

## 2013-12-27 LAB — MAGNESIUM: Magnesium: 2.7 mg/dL — ABNORMAL HIGH (ref 1.5–2.5)

## 2013-12-27 SURGERY — CORONARY ARTERY BYPASS GRAFTING (CABG)
Anesthesia: General | Site: Chest

## 2013-12-27 MED ORDER — MORPHINE SULFATE 2 MG/ML IJ SOLN
2.0000 mg | INTRAMUSCULAR | Status: DC | PRN
  Administered 2013-12-28 – 2013-12-29 (×6): 2 mg via INTRAVENOUS
  Filled 2013-12-27 (×7): qty 1

## 2013-12-27 MED ORDER — LACTATED RINGERS IV SOLN
INTRAVENOUS | Status: DC | PRN
  Administered 2013-12-27: 08:00:00 via INTRAVENOUS

## 2013-12-27 MED ORDER — ACETAMINOPHEN 160 MG/5ML PO SOLN
1000.0000 mg | Freq: Four times a day (QID) | ORAL | Status: DC
Start: 2013-12-28 — End: 2014-01-01

## 2013-12-27 MED ORDER — ACETAMINOPHEN 650 MG RE SUPP
650.0000 mg | Freq: Once | RECTAL | Status: AC
  Administered 2013-12-27: 650 mg via RECTAL

## 2013-12-27 MED ORDER — CALCIUM CHLORIDE 10 % IV SOLN
INTRAVENOUS | Status: DC | PRN
  Administered 2013-12-27 (×2): 100 mg via INTRAVENOUS

## 2013-12-27 MED ORDER — BRIMONIDINE TARTRATE 0.2 % OP SOLN
3.0000 [drp] | Freq: Every day | OPHTHALMIC | Status: DC
Start: 2013-12-28 — End: 2013-12-29
  Administered 2013-12-28 – 2013-12-29 (×2): 3 [drp] via OPHTHALMIC
  Filled 2013-12-27: qty 5

## 2013-12-27 MED ORDER — FAMOTIDINE IN NACL 20-0.9 MG/50ML-% IV SOLN
20.0000 mg | Freq: Two times a day (BID) | INTRAVENOUS | Status: DC
  Administered 2013-12-27: 20 mg via INTRAVENOUS
  Filled 2013-12-27: qty 50

## 2013-12-27 MED ORDER — METOPROLOL TARTRATE 1 MG/ML IV SOLN
2.5000 mg | INTRAVENOUS | Status: DC | PRN
Start: 2013-12-27 — End: 2014-01-01
  Administered 2013-12-29 – 2013-12-30 (×4): 2.5 mg via INTRAVENOUS
  Filled 2013-12-27 (×3): qty 5

## 2013-12-27 MED ORDER — ONDANSETRON HCL 4 MG/2ML IJ SOLN
4.0000 mg | Freq: Four times a day (QID) | INTRAMUSCULAR | Status: DC | PRN
  Administered 2013-12-28: 4 mg via INTRAVENOUS
  Filled 2013-12-27: qty 2

## 2013-12-27 MED ORDER — LACTATED RINGERS IV SOLN
INTRAVENOUS | Status: DC
  Administered 2013-12-27: 18:00:00 via INTRAVENOUS

## 2013-12-27 MED ORDER — LATANOPROST 0.005 % OP SOLN
1.0000 [drp] | Freq: Every day | OPHTHALMIC | Status: DC
  Administered 2013-12-27 – 2014-01-02 (×7): 1 [drp] via OPHTHALMIC
  Filled 2013-12-27: qty 2.5

## 2013-12-27 MED ORDER — DEXMEDETOMIDINE HCL IN NACL 200 MCG/50ML IV SOLN
0.0000 ug/kg/h | INTRAVENOUS | Status: DC
  Administered 2013-12-27: 0.7 ug/kg/h via INTRAVENOUS
  Filled 2013-12-27: qty 50

## 2013-12-27 MED ORDER — ROCURONIUM BROMIDE 100 MG/10ML IV SOLN
INTRAVENOUS | Status: DC | PRN
  Administered 2013-12-27: 50 mg via INTRAVENOUS

## 2013-12-27 MED ORDER — FENTANYL CITRATE 0.05 MG/ML IJ SOLN
INTRAMUSCULAR | Status: DC | PRN
  Administered 2013-12-27: 50 ug via INTRAVENOUS
  Administered 2013-12-27 (×2): 250 ug via INTRAVENOUS
  Administered 2013-12-27: 500 ug via INTRAVENOUS
  Administered 2013-12-27: 250 ug via INTRAVENOUS
  Administered 2013-12-27: 200 ug via INTRAVENOUS

## 2013-12-27 MED ORDER — CHLORHEXIDINE GLUCONATE 0.12 % MT SOLN
15.0000 mL | Freq: Two times a day (BID) | OROMUCOSAL | Status: DC
  Administered 2013-12-27: 15 mL via OROMUCOSAL

## 2013-12-27 MED ORDER — SODIUM CHLORIDE 0.9 % IV SOLN: 250.0000 mL | INTRAVENOUS | Status: DC

## 2013-12-27 MED ORDER — PHENYLEPHRINE 40 MCG/ML (10ML) SYRINGE FOR IV PUSH (FOR BLOOD PRESSURE SUPPORT)
PREFILLED_SYRINGE | INTRAVENOUS | Status: AC
  Filled 2013-12-27: qty 10

## 2013-12-27 MED ORDER — PROTAMINE SULFATE 10 MG/ML IV SOLN
INTRAVENOUS | Status: DC | PRN
  Administered 2013-12-27: 25 mg via INTRAVENOUS
  Administered 2013-12-27: 250 mg via INTRAVENOUS

## 2013-12-27 MED ORDER — HEPARIN SODIUM (PORCINE) 1000 UNIT/ML IJ SOLN
INTRAMUSCULAR | Status: AC
  Filled 2013-12-27: qty 1

## 2013-12-27 MED ORDER — ACETAMINOPHEN 500 MG PO TABS
1000.0000 mg | ORAL_TABLET | Freq: Four times a day (QID) | ORAL | Status: DC
Start: 2013-12-28 — End: 2014-01-01
  Administered 2013-12-28 – 2014-01-01 (×15): 1000 mg via ORAL
  Filled 2013-12-27 (×20): qty 2

## 2013-12-27 MED ORDER — PROPOFOL 10 MG/ML IV BOLUS
INTRAVENOUS | Status: DC | PRN
  Administered 2013-12-27: 60 mg via INTRAVENOUS

## 2013-12-27 MED ORDER — INSULIN REGULAR BOLUS VIA INFUSION
0.0000 [IU] | Freq: Three times a day (TID) | INTRAVENOUS | Status: DC
  Filled 2013-12-27: qty 10

## 2013-12-27 MED ORDER — BISACODYL 5 MG PO TBEC
10.0000 mg | DELAYED_RELEASE_TABLET | Freq: Every day | ORAL | Status: DC
  Administered 2013-12-29 – 2013-12-30 (×2): 10 mg via ORAL
  Filled 2013-12-27 (×2): qty 2

## 2013-12-27 MED ORDER — MUPIROCIN 2 % EX OINT
1.0000 "application " | TOPICAL_OINTMENT | Freq: Once | CUTANEOUS | Status: AC
  Administered 2013-12-27: 1 via TOPICAL

## 2013-12-27 MED ORDER — OXYCODONE HCL 5 MG PO TABS
5.0000 mg | ORAL_TABLET | ORAL | Status: DC | PRN
  Administered 2013-12-29: 5 mg via ORAL
  Administered 2013-12-29: 10 mg via ORAL
  Administered 2013-12-30: 5 mg via ORAL
  Filled 2013-12-27 (×2): qty 2
  Filled 2013-12-27: qty 1

## 2013-12-27 MED ORDER — SODIUM BICARBONATE 8.4 % IV SOLN
50.0000 meq | Freq: Once | INTRAVENOUS | Status: AC
  Administered 2013-12-27: 50 meq via INTRAVENOUS

## 2013-12-27 MED ORDER — MIDAZOLAM HCL 10 MG/2ML IJ SOLN
INTRAMUSCULAR | Status: AC
  Filled 2013-12-27: qty 2

## 2013-12-27 MED ORDER — BRIMONIDINE TARTRATE 0.15 % OP SOLN
3.0000 [drp] | Freq: Every day | OPHTHALMIC | Status: DC
  Filled 2013-12-27: qty 5

## 2013-12-27 MED ORDER — EPHEDRINE SULFATE 50 MG/ML IJ SOLN
INTRAMUSCULAR | Status: DC | PRN
  Administered 2013-12-27 (×2): 5 mg via INTRAVENOUS

## 2013-12-27 MED ORDER — NITROGLYCERIN IN D5W 200-5 MCG/ML-% IV SOLN: 0.0000 ug/min | INTRAVENOUS | Status: DC

## 2013-12-27 MED ORDER — FENTANYL CITRATE 0.05 MG/ML IJ SOLN
INTRAMUSCULAR | Status: AC
  Filled 2013-12-27: qty 5

## 2013-12-27 MED ORDER — PHENYLEPHRINE HCL 10 MG/ML IJ SOLN
0.0000 ug/min | INTRAVENOUS | Status: DC
  Administered 2013-12-27: 70 ug/min via INTRAVENOUS
  Administered 2013-12-27 – 2013-12-28 (×2): 65 ug/min via INTRAVENOUS
  Filled 2013-12-27 (×5): qty 2

## 2013-12-27 MED ORDER — MILRINONE IN DEXTROSE 20 MG/100ML IV SOLN
0.1250 ug/kg/min | INTRAVENOUS | Status: AC
  Administered 2013-12-27: .33 ug/kg/min via INTRAVENOUS
  Filled 2013-12-27: qty 100

## 2013-12-27 MED ORDER — ONDANSETRON HCL 4 MG/2ML IJ SOLN
INTRAMUSCULAR | Status: AC
  Filled 2013-12-27: qty 2

## 2013-12-27 MED ORDER — SODIUM CHLORIDE 0.45 % IV SOLN
INTRAVENOUS | Status: DC
  Administered 2013-12-27: 18:00:00 via INTRAVENOUS

## 2013-12-27 MED ORDER — MIDAZOLAM HCL 2 MG/2ML IJ SOLN
INTRAMUSCULAR | Status: AC
  Filled 2013-12-27: qty 2

## 2013-12-27 MED ORDER — SODIUM CHLORIDE 0.9 % IJ SOLN
OROMUCOSAL | Status: DC | PRN
  Administered 2013-12-27: 10:00:00 via TOPICAL

## 2013-12-27 MED ORDER — MIDAZOLAM HCL 2 MG/2ML IJ SOLN: 2.0000 mg | INTRAMUSCULAR | Status: DC | PRN

## 2013-12-27 MED ORDER — DOPAMINE-DEXTROSE 3.2-5 MG/ML-% IV SOLN
3.0000 ug/kg/min | INTRAVENOUS | Status: DC
  Administered 2013-12-27: 5 ug/kg/min via INTRAVENOUS
  Filled 2013-12-27: qty 250

## 2013-12-27 MED ORDER — VECURONIUM BROMIDE 10 MG IV SOLR
INTRAVENOUS | Status: AC
  Filled 2013-12-27: qty 10

## 2013-12-27 MED ORDER — AMIODARONE LOAD VIA INFUSION
150.0000 mg | Freq: Once | INTRAVENOUS | Status: AC
  Administered 2013-12-27: 150 mg via INTRAVENOUS
  Filled 2013-12-27: qty 83.34

## 2013-12-27 MED ORDER — HEPARIN SODIUM (PORCINE) 1000 UNIT/ML IJ SOLN
INTRAMUSCULAR | Status: DC | PRN
  Administered 2013-12-27: 27000 [IU] via INTRAVENOUS

## 2013-12-27 MED ORDER — ACETAMINOPHEN 160 MG/5ML PO SOLN: 650.0000 mg | Freq: Once | ORAL | Status: AC

## 2013-12-27 MED ORDER — DEXMEDETOMIDINE HCL IN NACL 400 MCG/100ML IV SOLN
0.4000 ug/kg/h | INTRAVENOUS | Status: DC
  Filled 2013-12-27: qty 100

## 2013-12-27 MED ORDER — PANTOPRAZOLE SODIUM 40 MG PO TBEC
40.0000 mg | DELAYED_RELEASE_TABLET | Freq: Every day | ORAL | Status: DC
  Administered 2013-12-29 – 2013-12-31 (×3): 40 mg via ORAL
  Filled 2013-12-27 (×3): qty 1

## 2013-12-27 MED ORDER — MUPIROCIN 2 % EX OINT
TOPICAL_OINTMENT | CUTANEOUS | Status: AC
  Administered 2013-12-27: 1 via TOPICAL
  Filled 2013-12-27: qty 22

## 2013-12-27 MED ORDER — GLYCOPYRROLATE 0.2 MG/ML IJ SOLN
INTRAMUSCULAR | Status: DC | PRN
  Administered 2013-12-27: 0.4 mg via INTRAVENOUS

## 2013-12-27 MED ORDER — VECURONIUM BROMIDE 10 MG IV SOLR
INTRAVENOUS | Status: DC | PRN
  Administered 2013-12-27 (×3): 4 mg via INTRAVENOUS
  Administered 2013-12-27: 2 mg via INTRAVENOUS
  Administered 2013-12-27: 4 mg via INTRAVENOUS

## 2013-12-27 MED ORDER — MAGNESIUM SULFATE 4 GM/100ML IV SOLN
4.0000 g | Freq: Once | INTRAVENOUS | Status: AC
  Administered 2013-12-27: 4 g via INTRAVENOUS
  Filled 2013-12-27: qty 100

## 2013-12-27 MED ORDER — 0.9 % SODIUM CHLORIDE (POUR BTL) OPTIME
TOPICAL | Status: DC | PRN
  Administered 2013-12-27: 5000 mL

## 2013-12-27 MED ORDER — SODIUM CHLORIDE 0.9 % IJ SOLN
INTRAMUSCULAR | Status: AC
  Filled 2013-12-27: qty 10

## 2013-12-27 MED ORDER — ALBUMIN HUMAN 5 % IV SOLN
250.0000 mL | INTRAVENOUS | Status: AC | PRN
  Administered 2013-12-27 (×3): 250 mL via INTRAVENOUS
  Filled 2013-12-27: qty 250

## 2013-12-27 MED ORDER — CHLORHEXIDINE GLUCONATE 0.12 % MT SOLN
OROMUCOSAL | Status: AC
  Filled 2013-12-27: qty 15

## 2013-12-27 MED ORDER — DOCUSATE SODIUM 100 MG PO CAPS
200.0000 mg | ORAL_CAPSULE | Freq: Every day | ORAL | Status: DC
  Administered 2013-12-29 – 2013-12-31 (×3): 200 mg via ORAL
  Filled 2013-12-27 (×3): qty 2

## 2013-12-27 MED ORDER — ALBUMIN HUMAN 5 % IV SOLN
INTRAVENOUS | Status: DC | PRN
  Administered 2013-12-27 (×5): via INTRAVENOUS

## 2013-12-27 MED ORDER — FENTANYL CITRATE 0.05 MG/ML IJ SOLN
INTRAMUSCULAR | Status: AC
Start: 2013-12-27 — End: 2013-12-27
  Filled 2013-12-27: qty 5

## 2013-12-27 MED ORDER — METOPROLOL TARTRATE 12.5 MG HALF TABLET
12.5000 mg | ORAL_TABLET | Freq: Two times a day (BID) | ORAL | Status: DC
  Filled 2013-12-27 (×3): qty 1

## 2013-12-27 MED ORDER — TRAMADOL HCL 50 MG PO TABS
50.0000 mg | ORAL_TABLET | ORAL | Status: DC | PRN
  Administered 2013-12-30 (×2): 100 mg via ORAL
  Filled 2013-12-27 (×3): qty 2

## 2013-12-27 MED ORDER — AMIODARONE HCL IN DEXTROSE 360-4.14 MG/200ML-% IV SOLN
60.0000 mg/h | INTRAVENOUS | Status: DC
  Administered 2013-12-27 (×2): 60 mg/h via INTRAVENOUS
  Filled 2013-12-27 (×2): qty 200

## 2013-12-27 MED ORDER — SODIUM CHLORIDE 0.9 % IV SOLN
INTRAVENOUS | Status: DC
  Administered 2013-12-27: 21:00:00 via INTRAVENOUS
  Administered 2013-12-27: 1.6 [IU]/h via INTRAVENOUS
  Filled 2013-12-27 (×2): qty 2.5

## 2013-12-27 MED ORDER — SUCCINYLCHOLINE CHLORIDE 20 MG/ML IJ SOLN
INTRAMUSCULAR | Status: AC
  Filled 2013-12-27: qty 1

## 2013-12-27 MED ORDER — MIDAZOLAM HCL 5 MG/5ML IJ SOLN
INTRAMUSCULAR | Status: DC | PRN
  Administered 2013-12-27: 2 mg via INTRAVENOUS
  Administered 2013-12-27: 1 mg via INTRAVENOUS
  Administered 2013-12-27: 2 mg via INTRAVENOUS

## 2013-12-27 MED ORDER — LIDOCAINE HCL (CARDIAC) 20 MG/ML IV SOLN
INTRAVENOUS | Status: AC
  Filled 2013-12-27: qty 5

## 2013-12-27 MED ORDER — SODIUM CHLORIDE 0.9 % IJ SOLN: 3.0000 mL | INTRAMUSCULAR | Status: DC | PRN

## 2013-12-27 MED ORDER — CETYLPYRIDINIUM CHLORIDE 0.05 % MT LIQD
7.0000 mL | Freq: Four times a day (QID) | OROMUCOSAL | Status: DC
  Administered 2013-12-28 (×3): 7 mL via OROMUCOSAL

## 2013-12-27 MED ORDER — HEMOSTATIC AGENTS (NO CHARGE) OPTIME
TOPICAL | Status: DC | PRN
  Administered 2013-12-27: 1 via TOPICAL

## 2013-12-27 MED ORDER — POTASSIUM CHLORIDE 10 MEQ/50ML IV SOLN: 10.0000 meq | INTRAVENOUS | Status: AC

## 2013-12-27 MED ORDER — ROCURONIUM BROMIDE 50 MG/5ML IV SOLN
INTRAVENOUS | Status: AC
  Filled 2013-12-27: qty 1

## 2013-12-27 MED ORDER — AMIODARONE HCL IN DEXTROSE 360-4.14 MG/200ML-% IV SOLN
30.0000 mg/h | INTRAVENOUS | Status: DC
  Filled 2013-12-27 (×3): qty 200

## 2013-12-27 MED ORDER — MORPHINE SULFATE 2 MG/ML IJ SOLN: 1.0000 mg | INTRAMUSCULAR | Status: AC | PRN

## 2013-12-27 MED ORDER — BISACODYL 10 MG RE SUPP: 10.0000 mg | Freq: Every day | RECTAL | Status: DC

## 2013-12-27 MED ORDER — SODIUM CHLORIDE 0.9 % IJ SOLN
3.0000 mL | Freq: Two times a day (BID) | INTRAMUSCULAR | Status: DC
  Administered 2013-12-28 – 2013-12-31 (×7): 3 mL via INTRAVENOUS

## 2013-12-27 MED ORDER — ASPIRIN EC 325 MG PO TBEC
325.0000 mg | DELAYED_RELEASE_TABLET | Freq: Every day | ORAL | Status: DC
  Filled 2013-12-27 (×2): qty 1

## 2013-12-27 MED ORDER — ASPIRIN 81 MG PO CHEW: 324.0000 mg | CHEWABLE_TABLET | Freq: Every day | ORAL | Status: DC

## 2013-12-27 MED ORDER — PROPOFOL 10 MG/ML IV BOLUS
INTRAVENOUS | Status: AC
  Filled 2013-12-27: qty 20

## 2013-12-27 MED ORDER — MILRINONE IN DEXTROSE 20 MG/100ML IV SOLN
0.3000 ug/kg/min | INTRAVENOUS | Status: DC
  Administered 2013-12-27 (×2): 0.3 ug/kg/min via INTRAVENOUS
  Filled 2013-12-27: qty 100

## 2013-12-27 MED ORDER — METOPROLOL TARTRATE 25 MG/10 ML ORAL SUSPENSION
12.5000 mg | Freq: Two times a day (BID) | ORAL | Status: DC
  Filled 2013-12-27 (×3): qty 5

## 2013-12-27 MED ORDER — SODIUM CHLORIDE 0.9 % IV SOLN
INTRAVENOUS | Status: DC
  Administered 2013-12-27: 18:00:00 via INTRAVENOUS
  Administered 2013-12-31: 20 mL/h via INTRAVENOUS

## 2013-12-27 MED ORDER — EPHEDRINE SULFATE 50 MG/ML IJ SOLN
INTRAMUSCULAR | Status: AC
  Filled 2013-12-27: qty 1

## 2013-12-27 MED ORDER — CEFUROXIME SODIUM 1.5 G IJ SOLR
1.5000 g | Freq: Two times a day (BID) | INTRAMUSCULAR | Status: AC
  Administered 2013-12-27 – 2013-12-29 (×4): 1.5 g via INTRAVENOUS
  Filled 2013-12-27 (×4): qty 1.5

## 2013-12-27 MED ORDER — LIDOCAINE HCL (CARDIAC) 20 MG/ML IV SOLN
INTRAVENOUS | Status: DC | PRN
  Administered 2013-12-27: 100 mg via INTRAVENOUS

## 2013-12-27 MED ORDER — VANCOMYCIN HCL IN DEXTROSE 1-5 GM/200ML-% IV SOLN
1000.0000 mg | Freq: Once | INTRAVENOUS | Status: AC
  Administered 2013-12-27: 1000 mg via INTRAVENOUS
  Filled 2013-12-27: qty 200

## 2013-12-27 MED ORDER — LACTATED RINGERS IV SOLN: 500.0000 mL | Freq: Once | INTRAVENOUS | Status: AC | PRN

## 2013-12-27 MED ORDER — PROTAMINE SULFATE 10 MG/ML IV SOLN
INTRAVENOUS | Status: AC
  Filled 2013-12-27: qty 5

## 2013-12-27 SURGICAL SUPPLY — 152 items
ADAPTER CARDIO PERF ANTE/RETRO (ADAPTER) ×3 IMPLANT
ADPR PRFSN 84XANTGRD RTRGD (ADAPTER) ×2
APPLICATOR COTTON TIP 6IN STRL (MISCELLANEOUS) IMPLANT
APPLIER CLIP 9.375 MED OPEN (MISCELLANEOUS)
APPLIER CLIP 9.375 SM OPEN (CLIP)
APR CLP MED 9.3 20 MLT OPN (MISCELLANEOUS)
APR CLP SM 9.3 20 MLT OPN (CLIP)
ATTRACTOMAT 16X20 MAGNETIC DRP (DRAPES) ×2 IMPLANT
BAG DECANTER FOR FLEXI CONT (MISCELLANEOUS) ×3 IMPLANT
BANDAGE ELASTIC 4 VELCRO ST LF (GAUZE/BANDAGES/DRESSINGS) ×3 IMPLANT
BANDAGE ELASTIC 6 VELCRO ST LF (GAUZE/BANDAGES/DRESSINGS) ×3 IMPLANT
BLADE CORE FAN STRYKER (BLADE) ×5 IMPLANT
BLADE OSCILLATING /SAGITTAL (BLADE) ×2 IMPLANT
BLADE STERNUM SYSTEM 6 (BLADE) ×2 IMPLANT
BLADE SURG 11 STRL SS (BLADE) IMPLANT
BLADE SURG 15 STRL LF DISP TIS (BLADE) ×2 IMPLANT
BLADE SURG 15 STRL SS (BLADE) ×3
BLADE SURG ROTATE 9660 (MISCELLANEOUS) ×1 IMPLANT
BNDG GAUZE ELAST 4 BULKY (GAUZE/BANDAGES/DRESSINGS) ×3 IMPLANT
BOOT SUTURE AID YELLOW STND (SUTURE) ×2 IMPLANT
CANISTER SUCTION 2500CC (MISCELLANEOUS) ×3 IMPLANT
CANN PRFSN .5XCNCT 15X34-48 (MISCELLANEOUS)
CANNULA AORTIC HI-FLOW 6.5M20F (CANNULA) ×2 IMPLANT
CANNULA ARTERIAL NVNT 3/8 22FR (MISCELLANEOUS) IMPLANT
CANNULA GUNDRY RCSP 15FR (MISCELLANEOUS) ×3 IMPLANT
CANNULA PRFSN .5XCNCT 15X34-48 (MISCELLANEOUS) ×2 IMPLANT
CANNULA VEN 2 STAGE (MISCELLANEOUS) ×1 IMPLANT
CANNULA VENOUS DUAL 32/40 (CANNULA) IMPLANT
CANNULA VESSEL W/WING WO/VALVE (CANNULA) IMPLANT
CARDIAC SUCTION (MISCELLANEOUS) ×2 IMPLANT
CATH CPB KIT GERHARDT (MISCELLANEOUS) ×3 IMPLANT
CATH HEART VENT LEFT (CATHETERS) ×2 IMPLANT
CATH RETROPLEGIA CORONARY 14FR (CATHETERS) ×2 IMPLANT
CATH ROBINSON RED A/P 18FR (CATHETERS) IMPLANT
CATH THORACIC 28FR (CATHETERS) ×3 IMPLANT
CATH/SQUID NICHOLS JEHLE COR (CATHETERS) ×1 IMPLANT
CLIP APPLIE 9.375 MED OPEN (MISCELLANEOUS) IMPLANT
CLIP APPLIE 9.375 SM OPEN (CLIP) IMPLANT
CLIP FOGARTY SPRING 6M (CLIP) IMPLANT
CLIP TI MEDIUM 24 (CLIP) IMPLANT
CLIP TI WIDE RED SMALL 24 (CLIP) ×3 IMPLANT
CONN 1/2X1/2X1/2  BEN (MISCELLANEOUS) ×1
CONN 1/2X1/2X1/2 BEN (MISCELLANEOUS) ×2 IMPLANT
CONN 3/8X1/2 ST GISH (MISCELLANEOUS) ×4 IMPLANT
CONN Y 3/8X3/8X3/8  BEN (MISCELLANEOUS)
CONN Y 3/8X3/8X3/8 BEN (MISCELLANEOUS) IMPLANT
COVER SURGICAL LIGHT HANDLE (MISCELLANEOUS) ×4 IMPLANT
CRADLE DONUT ADULT HEAD (MISCELLANEOUS) ×3 IMPLANT
DRAIN CHANNEL 28F RND 3/8 FF (WOUND CARE) ×2 IMPLANT
DRAIN CHANNEL 32F RND 10.7 FF (WOUND CARE) ×3 IMPLANT
DRAPE CARDIOVASCULAR INCISE (DRAPES) ×3
DRAPE SLUSH/WARMER DISC (DRAPES) ×3 IMPLANT
DRAPE SRG 135X102X78XABS (DRAPES) ×2 IMPLANT
DRSG AQUACEL AG ADV 3.5X14 (GAUZE/BANDAGES/DRESSINGS) ×7 IMPLANT
ELECT BLADE 4.0 EZ CLEAN MEGAD (MISCELLANEOUS) ×3
ELECT CAUTERY BLADE 6.4 (BLADE) ×3 IMPLANT
ELECT REM PT RETURN 9FT ADLT (ELECTROSURGICAL) ×6
ELECTRODE BLDE 4.0 EZ CLN MEGD (MISCELLANEOUS) ×2 IMPLANT
ELECTRODE REM PT RTRN 9FT ADLT (ELECTROSURGICAL) ×4 IMPLANT
GAUZE SPONGE 4X4 12PLY STRL (GAUZE/BANDAGES/DRESSINGS) ×6 IMPLANT
GLOVE BIO SURGEON STRL SZ 6 (GLOVE) IMPLANT
GLOVE BIO SURGEON STRL SZ 6.5 (GLOVE) ×10 IMPLANT
GLOVE BIO SURGEON STRL SZ7 (GLOVE) IMPLANT
GLOVE BIO SURGEON STRL SZ7.5 (GLOVE) ×2 IMPLANT
GLOVE BIOGEL PI IND STRL 6 (GLOVE) IMPLANT
GLOVE BIOGEL PI IND STRL 6.5 (GLOVE) IMPLANT
GLOVE BIOGEL PI IND STRL 7.0 (GLOVE) IMPLANT
GLOVE BIOGEL PI INDICATOR 6 (GLOVE) ×4
GLOVE BIOGEL PI INDICATOR 6.5 (GLOVE) ×2
GLOVE BIOGEL PI INDICATOR 7.0 (GLOVE) ×10
GOWN STRL REUS W/ TWL LRG LVL3 (GOWN DISPOSABLE) ×8 IMPLANT
GOWN STRL REUS W/TWL LRG LVL3 (GOWN DISPOSABLE) ×27
HEMOSTAT POWDER SURGIFOAM 1G (HEMOSTASIS) ×9 IMPLANT
HEMOSTAT SURGICEL 2X14 (HEMOSTASIS) ×3 IMPLANT
INSERT FOGARTY XLG (MISCELLANEOUS) ×1 IMPLANT
KIT BASIN OR (CUSTOM PROCEDURE TRAY) ×3 IMPLANT
KIT ROOM TURNOVER OR (KITS) ×3 IMPLANT
KIT SUCTION CATH 14FR (SUCTIONS) ×7 IMPLANT
KIT VASOVIEW W/TROCAR VH 2000 (KITS) ×3 IMPLANT
LEAD PACING MYOCARDI (MISCELLANEOUS) ×3 IMPLANT
LINE VENT (MISCELLANEOUS) ×1 IMPLANT
LOOP VESSEL SUPERMAXI WHITE (MISCELLANEOUS) ×1 IMPLANT
MARKER GRAFT CORONARY BYPASS (MISCELLANEOUS) ×9 IMPLANT
NDL 18GX1X1/2 (RX/OR ONLY) (NEEDLE) ×2 IMPLANT
NEEDLE 18GX1X1/2 (RX/OR ONLY) (NEEDLE) IMPLANT
NS IRRIG 1000ML POUR BTL (IV SOLUTION) ×15 IMPLANT
PACK OPEN HEART (CUSTOM PROCEDURE TRAY) ×3 IMPLANT
PAD ARMBOARD 7.5X6 YLW CONV (MISCELLANEOUS) ×6 IMPLANT
PAD DEFIB R2 (MISCELLANEOUS) IMPLANT
PAD ELECT DEFIB RADIOL ZOLL (MISCELLANEOUS) ×3 IMPLANT
PENCIL BUTTON HOLSTER BLD 10FT (ELECTRODE) ×3 IMPLANT
PUNCH AORTIC ROTATE 4.0MM (MISCELLANEOUS) IMPLANT
PUNCH AORTIC ROTATE 4.5MM 8IN (MISCELLANEOUS) IMPLANT
PUNCH AORTIC ROTATE 5MM 8IN (MISCELLANEOUS) IMPLANT
SET CARDIOPLEGIA MPS 5001102 (MISCELLANEOUS) ×1 IMPLANT
SOLUTION ANTI FOG 6CC (MISCELLANEOUS) ×2 IMPLANT
SPONGE GAUZE 4X4 12PLY STER LF (GAUZE/BANDAGES/DRESSINGS) ×2 IMPLANT
SPONGE LAP 18X18 X RAY DECT (DISPOSABLE) ×4 IMPLANT
SUCKER WEIGHTED FLEX (MISCELLANEOUS) ×3 IMPLANT
SUT BONE WAX W31G (SUTURE) ×3 IMPLANT
SUT ETHIBON 2 0 V 52N 30 (SUTURE) ×6 IMPLANT
SUT ETHIBOND 2 0 SH (SUTURE) ×14 IMPLANT
SUT ETHIBOND 2 0 SH 36X2 (SUTURE) ×8 IMPLANT
SUT ETHIBOND 2 0 V4 (SUTURE) IMPLANT
SUT ETHIBOND 2 0V4 GREEN (SUTURE) IMPLANT
SUT ETHIBOND NAB MH 2-0 36IN (SUTURE) ×1 IMPLANT
SUT PROLENE 3 0 RB 1 (SUTURE) ×3 IMPLANT
SUT PROLENE 3 0 SH 1 (SUTURE) ×2 IMPLANT
SUT PROLENE 3 0 SH1 36 (SUTURE) ×5 IMPLANT
SUT PROLENE 4 0 RB 1 (SUTURE) ×6
SUT PROLENE 4 0 TF (SUTURE) ×6 IMPLANT
SUT PROLENE 4-0 RB1 .5 CRCL 36 (SUTURE) ×4 IMPLANT
SUT PROLENE 5 0 C 1 36 (SUTURE) ×6 IMPLANT
SUT PROLENE 5 0 CC1 (SUTURE) IMPLANT
SUT PROLENE 6 0 C 1 30 (SUTURE) ×2 IMPLANT
SUT PROLENE 6 0 CC (SUTURE) ×8 IMPLANT
SUT PROLENE 7 0 BV1 MDA (SUTURE) ×4 IMPLANT
SUT PROLENE 8 0 BV175 6 (SUTURE) ×4 IMPLANT
SUT SILK  1 MH (SUTURE) ×3
SUT SILK 1 MH (SUTURE) ×4 IMPLANT
SUT SILK 1 TIES 10X30 (SUTURE) ×3 IMPLANT
SUT SILK 2 0 (SUTURE) ×3
SUT SILK 2 0 SH CR/8 (SUTURE) ×8 IMPLANT
SUT SILK 2 0 TIES 10X30 (SUTURE) ×1 IMPLANT
SUT SILK 2-0 18XBRD TIE 12 (SUTURE) ×2 IMPLANT
SUT SILK 3 0 SH CR/8 (SUTURE) ×3 IMPLANT
SUT SILK 4 0 (SUTURE) ×3
SUT SILK 4 0 TIE 10X30 (SUTURE) ×1 IMPLANT
SUT SILK 4-0 18XBRD TIE 12 (SUTURE) ×2 IMPLANT
SUT STEEL 6MS V (SUTURE) ×2 IMPLANT
SUT STEEL STERNAL CCS#1 18IN (SUTURE) IMPLANT
SUT STEEL SZ 6 DBL 3X14 BALL (SUTURE) ×2 IMPLANT
SUT TEM PAC WIRE 2 0 SH (SUTURE) ×10 IMPLANT
SUT VIC AB 1 CTX 18 (SUTURE) ×6 IMPLANT
SUT VIC AB 2-0 CT1 27 (SUTURE) ×3
SUT VIC AB 2-0 CT1 TAPERPNT 27 (SUTURE) IMPLANT
SUT VIC AB 2-0 CTX 27 (SUTURE) ×2 IMPLANT
SUT VIC AB 3-0 X1 27 (SUTURE) ×3 IMPLANT
SUT VICRYL 4-0 PS2 18IN ABS (SUTURE) ×3 IMPLANT
SUTURE E-PAK OPEN HEART (SUTURE) ×4 IMPLANT
SYSTEM SAHARA CHEST DRAIN ATS (WOUND CARE) ×3 IMPLANT
TAPE CLOTH SURG 4X10 WHT LF (GAUZE/BANDAGES/DRESSINGS) ×1 IMPLANT
TOWEL OR 17X24 6PK STRL BLUE (TOWEL DISPOSABLE) ×6 IMPLANT
TOWEL OR 17X26 10 PK STRL BLUE (TOWEL DISPOSABLE) ×5 IMPLANT
TRAY FOLEY IC TEMP SENS 14FR (CATHETERS) ×2 IMPLANT
TRAY FOLEY IC TEMP SENS 16FR (CATHETERS) ×3 IMPLANT
TUBE FEEDING 8FR 16IN STR KANG (MISCELLANEOUS) ×3 IMPLANT
TUBING INSUFFLATION (TUBING) ×3 IMPLANT
UNDERPAD 30X30 INCONTINENT (UNDERPADS AND DIAPERS) ×3 IMPLANT
VALVE MAGNA EASE AORTIC 23MM (Prosthesis & Implant Heart) ×1 IMPLANT
VENT LEFT HEART 12002 (CATHETERS) ×3
WATER STERILE IRR 1000ML POUR (IV SOLUTION) ×6 IMPLANT

## 2013-12-27 NOTE — Progress Notes (Signed)
S/p redo AVR, CABG  Intubated sedated  BP 161/42 mmHg  Pulse 57  Temp(Src) 97.5 F (36.4 C) (Oral)  Resp 18  Wt 159 lb 9.6 oz (72.394 kg)  SpO2 100%  32/20 CI= 3.6   Intake/Output Summary (Last 24 hours) at 12/27/13 1759 Last data filed at 12/27/13 1704  Gross per 24 hour  Intake   4050 ml  Output   5035 ml  Net   -985 ml   K= 4.3 hct = 25  Having short bursts of atrial fib- will start amiodarone

## 2013-12-27 NOTE — Progress Notes (Signed)
Dr. Servando Snare informed that pt's daughter wants to speak with him before surgery.  She has questions about what will be used for the grafts. Dr. Servando Snare will look for her in the waiting area before surgery.

## 2013-12-27 NOTE — Progress Notes (Signed)
  Echocardiogram Echocardiogram Transesophageal has been performed.  [name] [name] 12/27/2013, 9:30 AM

## 2013-12-27 NOTE — Anesthesia Postprocedure Evaluation (Signed)
  Anesthesia Post-op Note  Patient: [name] [name]  Procedure(s) Performed: Procedure(s): REDO AORTIC VALVE REPLACEMENT WITH 23MM MAGNA EASE PERICARDIAL TISSUE VALVE (N/A) TRANSESOPHAGEAL ECHOCARDIOGRAM (TEE) (N/A) CORONARY ARTERY BYPASS GRAFTING TIMES TWO USING RIGHT LEG SAPHENOUS VEIN TO RIGHT CORONARY ARTERY AND LEFT INTERNAL MAMMARY ARTERY TO LEFT ANTERIOR DESCENDING ARTERY (N/A)  Patient Location: SICU  Anesthesia Type:General  Level of Consciousness: sedated and Patient remains intubated per anesthesia plan  Airway and Oxygen Therapy: Patient remains intubated per anesthesia plan and Patient placed on Ventilator (see vital sign flow sheet for setting)  Post-op Pain: none  Post-op Assessment: Post-op Vital signs reviewed, Patient's Cardiovascular Status Stable, Respiratory Function Stable, Patent Airway, No signs of Nausea or vomiting and Pain level controlled  Post-op Vital Signs: stable  Last Vitals:  Filed Vitals:   12/27/13 0726  BP: 161/42  Pulse:   Temp:   Resp:     Complications: No apparent anesthesia complications

## 2013-12-27 NOTE — Anesthesia Preprocedure Evaluation (Signed)
Anesthesia Evaluation  Patient identified by MRN, date of birth, ID band Patient awake    Reviewed: Allergy & Precautions, H&P , NPO status , Patient's Chart, lab work & pertinent test results  Airway        Dental   Pulmonary sleep apnea ,          Cardiovascular hypertension, + Peripheral Vascular Disease and +CHF + Valvular Problems/Murmurs AS     Neuro/Psych    GI/Hepatic   Endo/Other    Renal/GU      Musculoskeletal   Abdominal   Peds  Hematology   Anesthesia Other Findings   Reproductive/Obstetrics                             Anesthesia Physical Anesthesia Plan  ASA: III  Anesthesia Plan: General   Post-op Pain Management:    Induction: Intravenous  Airway Management Planned: Oral ETT  Additional Equipment: Arterial line, CVP, PA Cath and TEE  Intra-op Plan:   Post-operative Plan: Post-operative intubation/ventilation  Informed Consent: I have reviewed the patients History and Physical, chart, labs and discussed the procedure including the risks, benefits and alternatives for the proposed anesthesia with the patient or authorized representative who has indicated his/her understanding and acceptance.     Plan Discussed with: Anesthesiologist, CRNA and Surgeon  Anesthesia Plan Comments:         Anesthesia Quick Evaluation

## 2013-12-27 NOTE — H&P (Signed)
[address]       [place] [address]             [phone]                    [place] [facility] [place] Medical Record #[id number] Date of Birth: 08/14/1940  Referring: Dr Mare Ferrari Primary Care: Leonides Sake, MD  Chief Complaint:    No chief complaint on file.   History of Present Illness:    [name] [name] 73 y.o. male is seen in the office  today for symptoms of increasing chf, including  sob with exertion. He had previous AVR in 2005.      02/09/2003  OPERATIVE REPORT PREOPERATIVE DIAGNOSIS: Critical aortic stenosis. POSTOPERATIVE DIAGNOSIS: Critical aortic stenosis. OPERATION/PROCEDURE: Aortic valve replacement with a #25 pericardial tissue valve. SURGEON: Lilia Argue. Servando Snare, M.D.  Current Activity/ Functional Status:  Patient is independent with mobility/ambulation, transfers, ADL's, IADL's.   Zubrod Score: At the time of surgery this patient's most appropriate activity status/level should be described as: []     0    Normal activity, no symptoms [x]     1    Restricted in physical strenuous activity but ambulatory, able to do out light work []     2    Ambulatory and capable of self care, unable to do work activities, up and about               >50 % of waking hours                              []     3    Only limited self care, in bed greater than 50% of waking hours []     4    Completely disabled, no self care, confined to bed or chair []     5    Moribund   Past Medical History  Diagnosis Date  . Aortic stenosis   . Hyperlipidemia   . Heart murmur   . Sleep apnea     mild to moderate  . Glaucoma     uses eye drops daily  . CHF (congestive heart failure)     takes Furosemide daily    Past Surgical History  Procedure Laterality Date  . Cardiac catheterization  02/08/2003    severe aortic stenosis,normal left ventricular function  . 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  Inguinal hernia repair  04/2003  . Left and right heart catheterization with coronary angiogram N/A 12/18/2013    Procedure: LEFT AND RIGHT HEART CATHETERIZATION WITH CORONARY ANGIOGRAM;  Surgeon: Peter M Martinique, MD;  Location: [facility] CATH LAB;  Service: Cardiovascular;  Laterality: N/A;    Family History  Problem Relation Age of Onset  . Heart disease Mother   . Cancer Father     History   Social History  . Marital Status: Married    Spouse Name: N/A    Number of Children: N/A  . Years of Education: N/A   Occupational History  . Not on file.   Social History Main Topics  . Smoking status: Never Smoker   . Smokeless tobacco: Not on file  . Alcohol Use: No  . Drug Use: No  . Sexual Activity: Not Currently   Other Topics Concern  . Not on file   Social History Narrative  History  Smoking status  . Never Smoker   Smokeless tobacco  . Not on file    History  Alcohol Use No     No Known Allergies  Current Facility-Administered Medications  Medication Dose Route Frequency Provider Last Rate Last Dose  . aminocaproic acid (AMICAR) 10 g in sodium chloride 0.9 % 100 mL infusion   Intravenous To OR Rebecka Apley, RPH      . cefUROXime (ZINACEF) 1.5 g in dextrose 5 % 50 mL IVPB  1.5 g Intravenous To OR Rebecka Apley, RPH      . cefUROXime (ZINACEF) 750 mg in dextrose 5 % 50 mL IVPB  750 mg Intravenous To [place], RPH      . dexmedetomidine (PRECEDEX) 400 MCG/100ML (4 mcg/mL) infusion  0.1-0.7 mcg/kg/hr Intravenous To OR Rebecka Apley, RPH      . DOPamine (INTROPIN) 800 mg in dextrose 5 % 250 mL (3.2 mg/mL) infusion  0-10 mcg/kg/min Intravenous To [place], RPH      . EPINEPHrine (ADRENALIN) 4 mg in dextrose 5 % 250 mL (0.016 mg/mL) infusion  0-10 mcg/min Intravenous To OR Rebecka Apley, RPH      . heparin 2,500 Units, papaverine 30 mg in electrolyte-148 (PLASMALYTE-148) 500 mL irrigation   Irrigation To [place], RPH      . heparin 30,000 units/NS 1000 mL  solution for CELLSAVER   Other To [place], [facility]      . insulin regular (NOVOLIN R,HUMULIN R) 250 Units in sodium chloride 0.9 % 250 mL (1 Units/mL) infusion   Intravenous To OR Rebecka Apley, RPH      . magnesium sulfate (IV Push/IM) injection 40 mEq  40 mEq Other To [place], RPH      . metoprolol tartrate (LOPRESSOR) tablet 12.5 mg  12.5 mg Oral Once Grace Isaac, MD      . mupirocin ointment (BACTROBAN) 2 % 1 application  1 application Topical Once Grace Isaac, MD      . mupirocin ointment (BACTROBAN) 2 %           . nitroGLYCERIN 50 mg in dextrose 5 % 250 mL (0.2 mg/mL) infusion  2-200 mcg/min Intravenous To OR Rebecka Apley, RPH      . phenylephrine (NEO-SYNEPHRINE) 20 mg in dextrose 5 % 250 mL (0.08 mg/mL) infusion  30-200 mcg/min Intravenous To [place], RPH      . potassium chloride injection 80 mEq  80 mEq Other To [place], [facility]      . vancomycin (VANCOCIN) 1,250 mg in sodium chloride 0.9 % 250 mL IVPB  1,250 mg Intravenous To [place], [facility]         Review of Systems:     Cardiac Review of Systems: Y or N  Chest Pain [  y  ]  Resting SOB [  n ] Exertional SOB  Blue.Reese  ]  Orthopnea [ y ]   Pedal Edema [ n  ]    Palpitations Blue.Reese  ] Syncope  [n  ]   Presyncope [ y  ]  General Review of Systems: [Y] = yes [  ]=no Constitional: recent weight change [  ];  Wt loss over the last 3 months [   ] anorexia [  ]; fatigue [  ]; nausea [  ]; night sweats [  ]; fever [  ];  or chills [  ];          Dental: poor dentition[n  ]; Last Dentist visit:   Eye : blurred vision [  ]; diplopia [   ]; vision changes [  ];  Amaurosis fugax[  ]; Resp: cough [  ];  wheezing[n  ];  hemoptysis[n  ]; shortness of breath[  ]; paroxysmal nocturnal dyspnea[  ]; dyspnea on exertion[  ]; or orthopnea[  ];  GI:  gallstones[  ], vomiting[n  ];  dysphagia[  ]; melena[  ];  hematochezia [  ]; heartburn[  ];   Hx of  Colonoscopy[  ]; GU: kidney stones [  ]; hematuria[  ];   dysuria [  ];   nocturia[  ];  history of     obstruction [  ]; urinary frequency [  ]             Skin: rash, swelling[  ];, hair loss[  ];  peripheral edema[  ];  or itching[  ]; Musculosketetal: myalgias[  ];  joint swelling[  ];  joint erythema[  ];  joint pain[  ];  back pain[  ];  Heme/Lymph: bruising[n  ];  bleeding[n  ];  Anemia[yes HCT 33  ];  Neuro: TIA[  ];  headaches[  ];  stroke[  ];  vertigo[  ];  seizures[  ];   paresthesias[  ];  difficulty walking[  ];  Psych:depression[  ]; anxiety[  ];  Endocrine: diabetes[  ];  thyroid dysfunction[  ];  Immunizations: Flu up to date [  ]; Pneumococcal up to date [  ];  Other:  Physical Exam: There were no vitals taken for this visit.  PHYSICAL EXAMINATION:  General appearance: alert, cooperative, appears stated age and no distress Neurologic: intact Heart: diastolic murmur: holodiastolic 3/6, decrescendo throughout the precordium Lungs: clear to auscultation bilaterally Abdomen: soft, non-tender; bowel sounds normal; no masses,  no organomegaly Extremities: extremities normal, atraumatic, no cyanosis or edema   Diagnostic Studies & Laboratory data:     Recent Radiology Findings:   Dg Chest 2 View  12/26/2013   CLINICAL DATA:  Coronary artery atherosclerosis. Aortic insufficiency. Pre admission for valve replacements.  EXAM: CHEST  2 VIEW  COMPARISON:  Multiple exams, including 12/25/2013  FINDINGS: Existing aortic valve prosthesis observed. Mild enlargement of the cardiopericardial silhouette, without pulmonary edema. Aside from remote granulomatous disease, the lungs appear clear.  IMPRESSION: 1. Mild enlargement of the cardiopericardial silhouette, without edema. Aortic valve prosthesis noted. 2. Old granulomatous disease.   Electronically Signed   By: Sherryl Barters M.D.   On: 12/26/2013 16:47   Dg Chest 2 View  12/12/2013   CLINICAL DATA:  1-2 month history of shortness of breath now acutely worse especially with activity  EXAM: CHEST  2  VIEW  COMPARISON:  PA and lateral chest of November 14, 2012  FINDINGS: There are small bilateral pleural effusions blunting the costophrenic angles. Coarse lung markings are present in the lower lobes bilaterally. The cardiac silhouette is enlarged. The pulmonary vascularity is not engorged. The patient has undergone previous median sternotomy and aortic valve replacement. The bony thorax exhibits no acute abnormality. There are 7 intact sternal wires.  IMPRESSION: There are small bilateral pleural effusions with bibasilar atelectasis or possibly pneumonia. There is no pulmonary vascular congestion or interstitial edema.   Electronically Signed   By: [place]  Martinique   On: 12/12/2013 17:06   Ct Angio Chest Aorta W/cm &/or Wo/cm  12/25/2013   CLINICAL DATA:  Aortic stenosis. Evaluate aortic root size. Prior aortic valve replacement 02/08/2013.  EXAM: CT ANGIOGRAPHY CHEST WITH CONTRAST  TECHNIQUE: Multidetector CT imaging of the chest was performed using the standard protocol during bolus administration of intravenous contrast. Multiplanar CT image reconstructions and MIPs were obtained to evaluate the vascular anatomy.  CONTRAST:  36mL OMNIPAQUE IOHEXOL 350 MG/ML SOLN  COMPARISON:  Plain film 12/12/2013.  FINDINGS: Lungs/Pleura: 7 mm right lower lobe lung nodule which is sub solid in morphology, including on image 46 of series 5.  Mild thickening along the right major fissure on image 35. Posterior right upper lobe calcified granuloma. Left base scarring.  No pleural fluid.  Minimal left-sided pleural thickening is seen.  Heart/Mediastinum: Atherosclerosis within the great vessels, which are normal in caliber. Status post aortic valve repair. Tortuous thoracic aorta. Ascending aorta best evaluated on coronal reformatted images. The aorta measures 3.2 cm at the sinuses of Valsalva. 2.5 cm at the sino-tubular junction. 3.2 cm approximately 3 cm above the sino-tubular junction. No dissection.  Moderate cardiomegaly.  Multivessel coronary artery atherosclerosis. No pericardial effusion. Multiple small middle mediastinal nodes. None are pathologic by size criteria. No hilar adenopathy.  Upper Abdomen: Suspicion of gallstones. Normal imaged portions of the liver, spleen, stomach, adrenal glands, left kidney. Suspect incompletely imaged right renal cyst. Low-density foci within the pancreatic body and head are likely areas of fatty atrophy/replacement.  Bones/Musculoskeletal:  No acute osseous abnormality.  Review of the MIP images confirms the above findings.  IMPRESSION: 1. Tortuous thoracic aorta, without evidence of aortic aneurysm. 2.  Atherosclerosis, including within the coronary arteries. 3. Probable cholelithiasis. 4. 7 mm sub solid right lower lobe pulmonary nodule. Per consensus criteria, this warrants followup chest CT at 3 months. This recommendation follows the consensus statement: Recommendations for the Management of Subsolid Pulmonary Nodules Detected at CT: A Statement from the [facility]. Radiology [id number].   Electronically Signed   By: Abigail Miyamoto M.D.   On: 12/25/2013 16:05      Recent Lab Findings: Lab Results  Component Value Date   WBC 6.8 12/20/2013   HGB 10.8* 12/20/2013   HCT 33.0* 12/20/2013   PLT 141.0* 12/20/2013   GLUCOSE 108* 12/26/2013   CHOL 194 03/11/2011   TRIG 41.0 03/11/2011   HDL 61.80 03/11/2011   LDLCALC 124* 03/11/2011   ALT 17 12/26/2013   AST 27 12/26/2013   NA 133* 12/26/2013   K 4.9 12/26/2013   CL 102 12/26/2013   CREATININE 1.01 12/26/2013   BUN 35* 12/26/2013   CO2 14* 12/26/2013   INR 1.02 12/26/2013   HGBA1C 5.4 12/26/2013   ECHO:12/2013  Study Conclusions  - Left ventricle: The cavity size was severely dilated. Wall thickness was increased in a pattern of mild LVH. Systolic function was moderately to severely reduced. The estimated ejection fraction was in the range of 30% to 35%. Diffuse hypokinesis. - Aortic valve:  Calcified and failing prosthetic tissue valve with moderate AR and moderate AS but gradients may be underestimated due to poor EF. Significant change in EF since 2014. - Mitral valve: Calcified annulus. Mildly thickened leaflets . There was mild regurgitation. - Left atrium: The atrium was moderately to severely dilated. - Atrial septum: No defect or patent foramen ovale was identified. - Pulmonary arteries: PA peak pressure: 45 mm Hg (S). - Pericardium, extracardiac: Large left pleural effusion and small pericardial effusion  Transthoracic echocardiography. M-mode, complete 2D, spectral Doppler, and color Doppler. Birthdate: Patient  birthdate: January 25, 1940. Age: Patient is 73 yr old. Sex: Gender: male. BMI: 23.7 kg/m^2. Blood pressure:   134/50 Patient status: Outpatient. Study date: Study date: 12/12/2013. Study time: 04:16 PM. Location: [facility]  -------------------------------------------------------------------  ------------------------------------------------------------------- Left ventricle: The cavity size was severely dilated. Wall thickness was increased in a pattern of mild LVH. Systolic function was moderately to severely reduced. The estimated ejection fraction was in the range of 30% to 35%. Diffuse hypokinesis.  ------------------------------------------------------------------- Aortic valve: Calcified and failing prosthetic tissue valve with moderate AR and moderate AS but gradients may be underestimated due to poor EF. Significant change in EF since 2014. Doppler:   VTI ratio of LVOT to aortic valve: 0.21. Valve area (VTI): 0.86 cm^2. Indexed valve area (VTI): 0.44 cm^2/m^2. Valve area (Vmax): 1.01 cm^2. Indexed valve area (Vmax): 0.51 cm^2/m^2. Mean velocity ratio of LVOT to aortic valve: 0.26. Valve area (Vmean): 1.08 cm^2. Indexed valve area (Vmean): 0.55 cm^2/m^2.  Mean gradient (S): 18 mm Hg. Peak gradient (S): 40 mm  Hg.  ------------------------------------------------------------------- Aorta: The aorta was normal, not dilated, and non-diseased.  ------------------------------------------------------------------- Mitral valve:  Calcified annulus. Mildly thickened leaflets . Doppler: There was mild regurgitation.  Peak gradient (D): 6 mm Hg.  ------------------------------------------------------------------- Left atrium: The atrium was moderately to severely dilated.  ------------------------------------------------------------------- Atrial septum: No defect or patent foramen ovale was identified.  ------------------------------------------------------------------- Right ventricle: The cavity size was normal. Wall thickness was normal. Systolic function was normal.  ------------------------------------------------------------------- Pulmonic valve:  Structurally normal valve.  Cusp separation was normal. Doppler: Transvalvular velocity was within the normal range. There was mild regurgitation.  ------------------------------------------------------------------- Tricuspid valve:  Doppler: There was mild regurgitation.  ------------------------------------------------------------------- Right atrium: The atrium was normal in size.  ------------------------------------------------------------------- Pericardium: Large left pleural effusion and small pericardial effusion The pericardium was normal in appearance.  ------------------------------------------------------------------- Post procedure conclusions Ascending Aorta:  - The aorta was normal, not dilated, and non-diseased.  ------------------------------------------------------------------- Measurements  Left ventricle              Value     Reference LV ID, ED, PLAX chordal      (H)   68.2 mm    43 - 52 LV ID, ES, PLAX chordal      (H)   54.2 mm    23 - 38 LV fx  shortening, PLAX chordal  (L)   21  %    >=29 LV PW thickness, ED            15.6 mm    --------- IVS/LV PW ratio, ED            0.88      <=1.3 Stroke volume, 2D             51  ml    --------- Stroke volume/bsa, 2D           26  ml/m^2  --------- LV e&', lateral              4.58 cm/s   --------- LV E/e&', lateral             25.98     --------- LV e&', medial               4.97 cm/s   --------- LV E/e&', medial              23.94     --------- LV e&', average              4.78  cm/s   --------- LV E/e&', average             24.92     ---------  Ventricular septum            Value     Reference IVS thickness, ED             13.7 mm    ---------  LVOT                   Value     Reference LVOT ID, S                23  mm    --------- LVOT area                 4.15 cm^2   --------- LVOT ID                  23  mm    --------- LVOT peak velocity, S           77.1 cm/s   --------- LVOT mean velocity, S           50.3 cm/s   --------- LVOT VTI, S                12.2 cm    --------- LVOT peak gradient, S           2   mm Hg  --------- Stroke volume (SV), LVOT DP        50.7 ml    --------- Stroke index (SV/bsa), LVOT DP      25.7 ml/m^2  ---------  Aortic valve               Value     Reference Aortic valve mean velocity, S       194  cm/s   --------- Aortic valve VTI, S            59.2 cm    --------- Aortic mean gradient, S          18  mm Hg  --------- Aortic peak gradient, S          40  mm Hg  --------- VTI ratio,  LVOT/AV            0.21      --------- Aortic valve area, VTI          0.86 cm^2   --------- Aortic valve area/bsa, VTI        0.44 cm^2/m^2 --------- Aortic valve area, peak velocity     1.01 cm^2   --------- Aortic valve area/bsa, peak        0.51 cm^2/m^2 --------- velocity Velocity ratio, mean, LVOT/AV       0.26      --------- Aortic valve area, mean velocity     1.08 cm^2   --------- Aortic valve area/bsa, mean        0.55 cm^2/m^2 --------- velocity Aortic regurg pressure half-time     227  ms    ---------  Aorta                   Value     Reference Aortic root ID, ED            35  mm    --------- Ascending aorta ID, A-P, S        32  mm    ---------  Left atrium  Value     Reference LA ID, A-P, ES              54  mm    --------- LA ID/bsa, A-P          (H)   2.74 cm/m^2  <=2.2 LA volume, S               147  ml    --------- LA volume/bsa, S             74.7 ml/m^2  --------- LA volume, ES, 1-p A4C          146  ml    --------- LA volume/bsa, ES, 1-p A4C        74.2 ml/m^2  --------- LA volume, ES, 1-p A2C          144  ml    --------- LA volume/bsa, ES, 1-p A2C        73.1 ml/m^2  ---------  Mitral valve               Value     Reference Mitral E-wave peak velocity        119  cm/s   --------- Mitral deceleration time         176  ms    150 - 230 Mitral peak gradient, D          6   mm Hg  ---------  Pulmonary arteries            Value     Reference PA pressure, S, DP        (H)   45  mm Hg  <=30  Tricuspid valve              Value      Reference Tricuspid regurg peak velocity      324  cm/s   --------- Tricuspid peak RV-RA gradient       42  mm Hg  ---------  Systemic veins              Value     Reference Estimated CVP               3   mm Hg  ---------  Right ventricle              Value     Reference RV pressure, S, DP        (H)   45  mm Hg  <=30 RV s&', lateral, S             9.16 cm/s   ---------  Legend: (L) and (H) mark values outside specified reference range.  ------------------------------------------------------------------- Prepared and Electronically Authenticated by  Jenkins Rouge, M.D. 2015-12-01T17:13:03  ECHO : one year ago Study Conclusions  - Left ventricle: The cavity size was mildly dilated. Wall thickness was increased in a pattern of mild LVH. Systolic function was normal. The estimated ejection fraction was in the range of 60% to 65%. Wall motion was normal; there were no regional wall motion abnormalities. Features are consistent with a pseudonormal left ventricular filling pattern, with concomitant abnormal relaxation and increased filling pressure (grade 2 diastolic dysfunction). - Aortic valve: A bioprosthesis was present. Moderate regurgitation. - Mitral valve: Calcified annulus. Mildly thickened leaflets . Mild regurgitation. - Left atrium: The atrium was moderately dilated. - Right ventricle: The cavity size was mildly dilated. - Right atrium: The atrium was moderately dilated. - Pulmonary arteries: Systolic pressure was mildly  to moderately increased. PA peak pressure: 15mm Hg (S). 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 Transthoracic echocardiography. M-mode, complete 2D, spectral Doppler, and color Doppler. Height: Height: 182.9cm. Height: 72in.  Weight: Weight: 77.6kg. Weight: 170.6lb. Body mass index: BMI: 23.2kg/m^2. Body surface area:  BSA: 1.36m^2. Blood pressure:   138/60. Patient status: Outpatient. Location: [facility]  ------------------------------------------------------------  ------------------------------------------------------------ Left ventricle: The cavity size was mildly dilated. Wall thickness was increased in a pattern of mild LVH. Systolic function was normal. The estimated ejection fraction was in the range of 60% to 65%. Wall motion was normal; there were no regional wall motion abnormalities. Features are consistent with a pseudonormal left ventricular filling pattern, with concomitant abnormal relaxation and increased filling pressure (grade 2 diastolic dysfunction).  ------------------------------------------------------------ Aortic valve:  Moderately thickened leaflets. A bioprosthesis was present. Mobility was not restricted. Doppler:  Moderate regurgitation.  VTI ratio of LVOT to aortic valve: 0.39. Valve area: 1.24cm^2(VTI). Indexed valve area: 0.62cm^2/m^2 (VTI). Peak velocity ratio of LVOT to aortic valve: 0.36. Valve area: 1.13cm^2 (Vmax). Indexed valve area: 0.57cm^2/m^2 (Vmax).  Mean gradient: 71mm Hg (S). Peak gradient: 83mm Hg (S).  ------------------------------------------------------------ Aorta: Aortic root: The aortic root was normal in size.  ------------------------------------------------------------ Mitral valve:  Calcified annulus. Mildly thickened leaflets . Mobility was not restricted. Doppler: Transvalvular velocity was within the normal range. There was no evidence for stenosis. Mild regurgitation.  Peak gradient: 75mm Hg (D).  ------------------------------------------------------------ Left atrium: The atrium was moderately dilated.  ------------------------------------------------------------ Right ventricle: The cavity size  was mildly dilated. Systolic function was normal.  ------------------------------------------------------------ Pulmonic valve:  Doppler: Transvalvular velocity was within the normal range. There was no evidence for stenosis. Mild regurgitation.  ------------------------------------------------------------ Tricuspid valve:  Structurally normal valve.  Doppler: Transvalvular velocity was within the normal range. Mild regurgitation.  ------------------------------------------------------------ Pulmonary artery:  Systolic pressure was mildly to moderately increased.  ------------------------------------------------------------ Right atrium: The atrium was moderately dilated.  ------------------------------------------------------------ Pericardium: There was no pericardial effusion.  ------------------------------------------------------------ Systemic veins: Inferior vena cava: The vessel was normal in size.  ------------------------------------------------------------  2D measurements    Normal Doppler measurements  Normal Left ventricle         Main pulmonary LVID ED,  58.2 mm   43-52  artery chord,             Pressure,  40 mm Hg =30 PLAX              S LVID ES,  37.8 mm   23-38  Left ventricle chord,             Ea, lat  3.73 cm/s  ------ PLAX              ann, tiss FS, chord,  35 %   >29   DP PLAX              E/Ea, lat 31.3    ------ LVPW, ED  12.1 mm   ------ ann, tiss   7 IVS/LVPW  0.93    <1.3  DP ratio, ED           Ea, med  5.33 cm/s  ------ Ventricular septum       ann, tiss IVS, ED  11.2 mm   ------ DP LVOT              E/Ea, med 21.9    ------ Diam, S   20 mm   ------  ann, tiss   5 Area    3.14 cm^2  ------ DP Diam     20 mm   ------ LVOT Aorta              Peak vel,  121 cm/s  ------ Root diam,  35 mm   ------ S ED               VTI, S   30.7 cm   ------ Left atrium          Peak     6 mm Hg ------ AP dim    43 mm   ------ gradient, AP dim   2.16 cm/m^2 <2.2  S index             Stroke vol 96.4 ml   ------                Stroke   48.5 ml/m^2 ------                index                Aortic valve                Peak vel,  336 cm/s  ------                S                Mean vel,  211 cm/s  ------                S                VTI, S    78 cm   ------                Mean     22 mm Hg ------                gradient,                S                Peak     45 mm Hg ------                gradient,                S                VTI ratio 0.39    ------                LVOT/AV                Area, VTI 1.24 cm^2  ------                Area index 0.62 cm^2/m ------                (VTI)      ^2                Peak vel  0.36    ------                ratio,                LVOT/AV                Area, Vmax 1.13 cm^2  ------                Area index  0.57 cm^2/m ------                (Vmax)     ^2                Regurg PHT 369 ms   ------                Mitral valve                Peak E vel 117 cm/s  ------                Peak A vel 47.2 cm/s  ------                Decelerati 338 ms   150-23                on time        0                 Peak     5 mm Hg ------                gradient,                D                Peak E/A  2.5    ------                ratio                Tricuspid valve                Regurg   297 cm/s  ------                peak vel                Peak RV-RA  35 mm Hg ------                gradient,                S                Systemic veins                Estimated   5 mm Hg ------                CVP                Right ventricle                Pressure,  40 mm Hg <30                S                Sa vel,  13.9 cm/s  ------                lat ann,   3                tiss DP  ------------------------------------------------------------ Prepared and Electronically Authenticated by  Kirk Ruths 2014-11-03T09:57:[id number]  CATH: Name: [name] [name] MRN: [id number] DOB: [birth year]  Procedure: Right Heart Cath, Selective Coronary Angiography, aortic root angiography  Indication: 73 yo WM with 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 EF 30-35% by Echo.   Procedural Details: The right wrist was prepped, draped, and anesthetized with 1% lidocaine.  Using the modified Seldinger technique a 6 Fr slender sheath was placed in the right radial artery. Attempted to exchange the brachial IV catheter with a sheath but the IV was in a side branch and the sheath could not be advanced. The right groin was prepped and anesthetized with 1% lidocaine and a 7 Fr. Venous sheath was placed via the right femoral vein. A Swan-Ganz catheter was used for the right heart catheterization.  Standard protocol was followed for recording of right heart pressures and sampling of oxygen saturations. Fick cardiac output was calculated. Standard Judkins catheters were used for selective coronary angiography and aortic root angiography. There were no immediate procedural complications. The patient was transferred to the post catheterization recovery area for further monitoring.  Procedural Findings: Hemodynamics RA 9/8 mean 5 mm Hg RV 48/10 mm Hg PA 45/16 mean 28 mm Hg  PCWP 19/34 mean 21 mm Hg, prominent V waves. LV N/A, The AV was not crossed. AO 102/45 mean 66 mm Hg  Oxygen saturations: PA 66% AO 99%  Cardiac Output (Fick) 5.2 L/min  Cardiac Index (Fick) 2.62 L/min/meter squared  Coronary angiography: Coronary dominance: right  Left mainstem: Short without significant disease.   Left anterior descending (LAD): The LAD is moderately calcified. There is an 80-90% stenosis in the proximal vessel followed by diffuse disease in the mid vessel up to 80-90%  Left circumflex (LCx): The LCx gives off a single large OM branch. It is without significant disease.  Right coronary artery (RCA): The RCA is diffusely diseased in the proximal to mid vessel up to 30%. There is a focal 50% stenosis at the takeoff of a large RV branch. At the crux there is an 80-90% stenosis.   Left ventriculography: The AV was not crossed.  Aortic root angiography: There is dilation of the proximal aorta. There is severe aortic insufficiency. LV is enlarged with global hypokinesis and moderate to severe LV dysfunction.  Final Conclusions:  1. Severe 2 vessel obstructive CAD 2. Severe aortic valve insufficiency. 3. LV dysfunction 4. Proximal aortic enlargement. 5. Mild pulmonary HTN. 6. Elevated LV filling pressures. Prominent V waves may represent poor LV compliance or mitral insufficiency. Echo showed only mild MR.   Recommendations: Refer to CT surgery for AVR and CABG. Will need to  assess MV carefully at the time of surgery with TEE.   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 LV dysfunction Proximal aortic enlargement. Mild pulmonary HTN. Anemia, iron deficiency  I have recommend to proceed with redo AVR and CABG. Risk and options discussed with patient. Will need full GI evaluation postop, with age , iron defiency anemia have recommended use of tissue valve 7 mm sub solid right lower lobe pulmonary nodule. Per consensus criteria, this warrants followup chest CT at 3 months.    The goals risks and alternatives of the planned surgical procedure CABG and redo AVR  have been discussed with the patient in detail. The risks of the procedure including death, infection, heart block with need for pacemaker, stroke, myocardial infarction, bleeding, blood transfusion have all been discussed specifically.  I have quoted [name] a 5 % of perioperative mortality and a complication rate as high as 30 %. The patient's questions have been answered.[name] is willing  to proceed with the planned procedure.     Grace Isaac MD      [address]  [employer] [id number] Office [phone]   Beeper [phone]  12/27/2013 7:04 AM

## 2013-12-27 NOTE — Progress Notes (Signed)
BP dropped to 76 systolic during amio bolus, MD advised to stop bolus at this time and begin infusion at previously ordered rate. Pt received 75 mg of bolus.  Will continue to monitor closely.

## 2013-12-27 NOTE — Transfer of Care (Signed)
Immediate Anesthesia Transfer of Care Note  Patient: [name] [name]  Procedure(s) Performed: Procedure(s): REDO AORTIC VALVE REPLACEMENT WITH 23MM MAGNA EASE PERICARDIAL TISSUE VALVE (N/A) TRANSESOPHAGEAL ECHOCARDIOGRAM (TEE) (N/A) CORONARY ARTERY BYPASS GRAFTING TIMES TWO USING RIGHT LEG SAPHENOUS VEIN TO RIGHT CORONARY ARTERY AND LEFT INTERNAL MAMMARY ARTERY TO LEFT ANTERIOR DESCENDING ARTERY (N/A)  Patient Location: SICU  Anesthesia Type:General  Level of Consciousness: Patient remains intubated per anesthesia plan  Airway & Oxygen Therapy: Patient remains intubated per anesthesia plan and Patient placed on Ventilator (see vital sign flow sheet for setting)  Post-op Assessment: Report given to PACU RN and Post -op Vital signs reviewed and stable  Post vital signs: Reviewed and stable  Complications: No apparent anesthesia complications

## 2013-12-27 NOTE — Brief Op Note (Addendum)
      [address]       [place] [address]             [phone]     12/27/2013  2:39 PM  PATIENT:  [name] [name]  73 y.o. 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  PRE-OPERATIVE WEIGHT: 72kg  EBL: SEE ANEST/PERFUSION RECORDS  COMPLICATIONS: NO KNOWN  Aortic Valve  Procedure Performed:  Replacement: Yes.  Bioprosthetic Valve. Implant Model Number:[id number], Size:23, Unique Device Identifier:[id number]  Repair/Reconstruction: No.   Aortic Annular Enlargement: No.    Aortic Valve Etiology   Aortic Insufficiency:  Moderate  Aortic Valve Disease:  Yes.  Aortic Stenosis:  Yes. Smallest Aortic Valve Area: 0.44cm2; Highest Mean Gradient: 32mmHg.  Etiology (Choose at least one and up to  5 etiologies):  Degenerative - Calcified and Other, PREVIOUS BIOPROSTHETIC AVR

## 2013-12-27 NOTE — Progress Notes (Signed)
Lopressor not given per order parameters, pulse less than 60 (56-57).

## 2013-12-28 ENCOUNTER — Inpatient Hospital Stay (HOSPITAL_COMMUNITY): Payer: Medicare Other

## 2013-12-28 LAB — POCT I-STAT 3, ART BLOOD GAS (G3+)
Acid-base deficit: 2 mmol/L (ref 0.0–2.0)
Acid-base deficit: 5 mmol/L — ABNORMAL HIGH (ref 0.0–2.0)
Bicarbonate: 20.7 mEq/L (ref 20.0–24.0)
Bicarbonate: 22.8 mEq/L (ref 20.0–24.0)
O2 Saturation: 98 %
O2 Saturation: 99 %
Patient temperature: 36.3
Patient temperature: 36.5
TCO2: 22 mmol/L (ref 0–100)
TCO2: 24 mmol/L (ref 0–100)
pCO2 arterial: 39.3 mmHg (ref 35.0–45.0)
pCO2 arterial: 39.9 mmHg (ref 35.0–45.0)
pH, Arterial: 7.32 — ABNORMAL LOW (ref 7.350–7.450)
pH, Arterial: 7.369 (ref 7.350–7.450)
pO2, Arterial: 121 mmHg — ABNORMAL HIGH (ref 80.0–100.0)
pO2, Arterial: 129 mmHg — ABNORMAL HIGH (ref 80.0–100.0)

## 2013-12-28 LAB — BASIC METABOLIC PANEL
Anion gap: 12 (ref 5–15)
BUN: 19 mg/dL (ref 6–23)
CO2: 21 mEq/L (ref 19–32)
Calcium: 8.5 mg/dL (ref 8.4–10.5)
Chloride: 106 mEq/L (ref 96–112)
Creatinine, Ser: 0.77 mg/dL (ref 0.50–1.35)
GFR calc Af Amer: >90 mL/min (ref >90)
GFR calc non Af Amer: 88 mL/min — ABNORMAL LOW (ref >90)
Glucose, Bld: 113 mg/dL — ABNORMAL HIGH (ref 70–99)
Potassium: 4.8 mEq/L (ref 3.7–5.3)
Sodium: 139 mEq/L (ref 137–147)

## 2013-12-28 LAB — CBC
HCT: 19.5 % — ABNORMAL LOW (ref 39.0–52.0)
HCT: 23.4 % — ABNORMAL LOW (ref 39.0–52.0)
HEMOGLOBIN: 7.9 g/dL — AB (ref 13.0–17.0)
Hemoglobin: 6.7 g/dL — CL (ref 13.0–17.0)
MCH: 29.1 pg (ref 26.0–34.0)
MCH: 28.6 pg (ref 26.0–34.0)
MCHC: 34.4 g/dL (ref 30.0–36.0)
MCHC: 33.8 g/dL (ref 30.0–36.0)
MCV: 84.8 fL (ref 78.0–100.0)
MCV: 84.8 fL (ref 78.0–100.0)
PLATELETS: 62 10*3/uL — AB (ref 150–400)
Platelets: 56 10*3/uL — ABNORMAL LOW (ref 150–400)
RBC: 2.3 MIL/uL — AB (ref 4.22–5.81)
RBC: 2.76 MIL/uL — AB (ref 4.22–5.81)
RDW: 13.5 % (ref 11.5–15.5)
RDW: 13.8 % (ref 11.5–15.5)
WBC: 8.8 10*3/uL (ref 4.0–10.5)
WBC: 10.6 10*3/uL — AB (ref 4.0–10.5)

## 2013-12-28 LAB — GLUCOSE, CAPILLARY
Glucose-Capillary: 102 mg/dL — ABNORMAL HIGH (ref 70–99)
Glucose-Capillary: 105 mg/dL — ABNORMAL HIGH (ref 70–99)
Glucose-Capillary: 109 mg/dL — ABNORMAL HIGH (ref 70–99)
Glucose-Capillary: 119 mg/dL — ABNORMAL HIGH (ref 70–99)
Glucose-Capillary: 121 mg/dL — ABNORMAL HIGH (ref 70–99)
Glucose-Capillary: 123 mg/dL — ABNORMAL HIGH (ref 70–99)
Glucose-Capillary: 116 mg/dL — ABNORMAL HIGH (ref 70–99)
Glucose-Capillary: 118 mg/dL — ABNORMAL HIGH (ref 70–99)
Glucose-Capillary: 130 mg/dL — ABNORMAL HIGH (ref 70–99)
Glucose-Capillary: 136 mg/dL — ABNORMAL HIGH (ref 70–99)
Glucose-Capillary: 139 mg/dL — ABNORMAL HIGH (ref 70–99)
Glucose-Capillary: 92 mg/dL (ref 70–99)
Glucose-Capillary: 123 mg/dL — ABNORMAL HIGH (ref 70–99)
Glucose-Capillary: 133 mg/dL — ABNORMAL HIGH (ref 70–99)
Glucose-Capillary: 132 mg/dL — ABNORMAL HIGH (ref 70–99)

## 2013-12-28 LAB — POCT I-STAT, CHEM 8
BUN: 21 mg/dL (ref 6–23)
BUN: 20 mg/dL (ref 6–23)
Calcium, Ion: 1.36 mmol/L — ABNORMAL HIGH (ref 1.13–1.30)
Calcium, Ion: 1.23 mmol/L (ref 1.13–1.30)
Chloride: 105 mEq/L (ref 96–112)
Chloride: 101 mEq/L (ref 96–112)
Creatinine, Ser: 0.7 mg/dL (ref 0.50–1.35)
Creatinine, Ser: 0.9 mg/dL (ref 0.50–1.35)
Glucose, Bld: 126 mg/dL — ABNORMAL HIGH (ref 70–99)
Glucose, Bld: 150 mg/dL — ABNORMAL HIGH (ref 70–99)
HCT: 19 % — ABNORMAL LOW (ref 39.0–52.0)
HCT: 23 % — ABNORMAL LOW (ref 39.0–52.0)
Hemoglobin: 6.5 g/dL — CL (ref 13.0–17.0)
Hemoglobin: 7.8 g/dL — ABNORMAL LOW (ref 13.0–17.0)
Potassium: 4.3 mEq/L (ref 3.7–5.3)
Potassium: 5.3 mEq/L (ref 3.7–5.3)
Sodium: 139 mEq/L (ref 137–147)
Sodium: 136 mEq/L — ABNORMAL LOW (ref 137–147)
TCO2: 19 mmol/L (ref 0–100)
TCO2: 21 mmol/L (ref 0–100)

## 2013-12-28 LAB — MAGNESIUM
Magnesium: 2.4 mg/dL (ref 1.5–2.5)
Magnesium: 2.3 mg/dL (ref 1.5–2.5)

## 2013-12-28 LAB — PREPARE RBC (CROSSMATCH)

## 2013-12-28 LAB — CREATININE, SERUM
CREATININE: 0.78 mg/dL (ref 0.50–1.35)
GFR calc non Af Amer: 87 mL/min — ABNORMAL LOW (ref >90)

## 2013-12-28 MED ORDER — FUROSEMIDE 10 MG/ML IJ SOLN
20.0000 mg | Freq: Once | INTRAMUSCULAR | Status: AC
  Administered 2013-12-28: 20 mg via INTRAVENOUS
  Filled 2013-12-28: qty 2

## 2013-12-28 MED ORDER — INSULIN ASPART 100 UNIT/ML ~~LOC~~ SOLN
0.0000 [IU] | SUBCUTANEOUS | Status: DC
  Administered 2013-12-28 (×2): 2 [IU] via SUBCUTANEOUS

## 2013-12-28 MED ORDER — PNEUMOCOCCAL VAC POLYVALENT 25 MCG/0.5ML IJ INJ
0.5000 mL | INJECTION | INTRAMUSCULAR | Status: DC
  Filled 2013-12-28: qty 0.5

## 2013-12-28 MED ORDER — AMIODARONE HCL IN DEXTROSE 360-4.14 MG/200ML-% IV SOLN
30.0000 mg/h | INTRAVENOUS | Status: DC
  Filled 2013-12-28: qty 200

## 2013-12-28 MED ORDER — INSULIN ASPART 100 UNIT/ML ~~LOC~~ SOLN
0.0000 [IU] | SUBCUTANEOUS | Status: DC
  Administered 2013-12-28 (×2): 2 [IU] via SUBCUTANEOUS
  Administered 2013-12-29: 4 [IU] via SUBCUTANEOUS

## 2013-12-28 MED ORDER — INFLUENZA VAC SPLIT QUAD 0.5 ML IM SUSY
0.5000 mL | PREFILLED_SYRINGE | INTRAMUSCULAR | Status: DC
  Filled 2013-12-28: qty 0.5

## 2013-12-28 MED ORDER — FUROSEMIDE 10 MG/ML IJ SOLN
40.0000 mg | Freq: Once | INTRAMUSCULAR | Status: AC
  Administered 2013-12-28: 40 mg via INTRAVENOUS
  Filled 2013-12-28: qty 4

## 2013-12-28 MED ORDER — SODIUM CHLORIDE 0.9 % IV SOLN
Freq: Once | INTRAVENOUS | Status: AC
  Administered 2013-12-28: 10:00:00 via INTRAVENOUS

## 2013-12-28 MED ORDER — SODIUM CHLORIDE 0.9 % IV SOLN
Freq: Once | INTRAVENOUS | Status: AC
  Administered 2013-12-28: 21:00:00 via INTRAVENOUS

## 2013-12-28 MED ORDER — INSULIN ASPART 100 UNIT/ML ~~LOC~~ SOLN: 0.0000 [IU] | SUBCUTANEOUS | Status: DC

## 2013-12-28 MED FILL — Heparin Sodium (Porcine) Inj 1000 Unit/ML: INTRAMUSCULAR | Qty: 30 | Status: AC

## 2013-12-28 MED FILL — Lidocaine HCl IV Inj 20 MG/ML: INTRAVENOUS | Qty: 5 | Status: AC

## 2013-12-28 MED FILL — Heparin Sodium (Porcine) Inj 1000 Unit/ML: INTRAMUSCULAR | Qty: 10 | Status: AC

## 2013-12-28 MED FILL — Sodium Bicarbonate IV Soln 8.4%: INTRAVENOUS | Qty: 50 | Status: AC

## 2013-12-28 MED FILL — Cefuroxime Sodium For Inj 750 MG: INTRAMUSCULAR | Qty: 750 | Status: AC

## 2013-12-28 MED FILL — Electrolyte-R (PH 7.4) Solution: INTRAVENOUS | Qty: 1000 | Status: AC

## 2013-12-28 MED FILL — Sodium Chloride IV Soln 0.9%: INTRAVENOUS | Qty: 2000 | Status: AC

## 2013-12-28 MED FILL — Magnesium Sulfate Inj 50%: INTRAMUSCULAR | Qty: 10 | Status: AC

## 2013-12-28 MED FILL — Mannitol IV Soln 20%: INTRAVENOUS | Qty: 500 | Status: AC

## 2013-12-28 MED FILL — Potassium Chloride Inj 2 mEq/ML: INTRAVENOUS | Qty: 40 | Status: AC

## 2013-12-28 NOTE — Progress Notes (Signed)
Patient ID: TYRONNE BLANN, male   DOB: May 19, 1940, 73 y.o.   MRN: [id number] TCTS DAILY ICU PROGRESS NOTE                   [address]            [place] [address]          [phone]   1 Day Post-Op Procedure(s) (LRB): REDO AORTIC VALVE REPLACEMENT WITH 23MM MAGNA EASE PERICARDIAL TISSUE VALVE (N/A) TRANSESOPHAGEAL ECHOCARDIOGRAM (TEE) (N/A) CORONARY ARTERY BYPASS GRAFTING TIMES TWO USING RIGHT LEG SAPHENOUS VEIN TO RIGHT CORONARY ARTERY AND LEFT INTERNAL MAMMARY ARTERY TO LEFT ANTERIOR DESCENDING ARTERY (N/A)  Total Length of Stay:  LOS: 1 day   Subjective: Extubated, awake and alert  Objective: Vital signs in last 24 hours: Temp:  [96.3 F (35.7 C)-97.9 F (36.6 C)] 97.5 F (36.4 C) (12/17 0715) Pulse Rate:  [58-96] 88 (12/17 0715) Cardiac Rhythm:  [-] A-V Sequential paced (12/17 0600) Resp:  [7-24] 14 (12/17 0715) BP: (83-124)/(26-80) 105/62 mmHg (12/17 0600) SpO2:  [91 %-100 %] 100 % (12/17 0715) Arterial Line BP: (85-117)/(50-69) 103/55 mmHg (12/17 0715) FiO2 (%):  [40 %-50 %] 40 % (12/16 2308) Weight:  [168 lb 14 oz (76.6 kg)] 168 lb 14 oz (76.6 kg) (12/17 0630)  Filed Weights   12/27/13 0718 12/28/13 0630  Weight: 159 lb 9.6 oz (72.394 kg) 168 lb 14 oz (76.6 kg)    Weight change:    Hemodynamic parameters for last 24 hours: PAP: (24-47)/(7-29) 36/16 mmHg CO:  [6.8 L/min-7.5 L/min] 7.4 L/min CI:  [3.6 L/min/m2-3.9 L/min/m2] 3.9 L/min/m2  Intake/Output from previous day: 12/16 0701 - 12/17 0700 In: 7318 [I.V.:4018; Blood:650; IV OACZYSAYT:0160] Out: 1093 [ATFTD:3220; Blood:1685; Chest Tube:920]  Intake/Output this shift:    Current Meds: Scheduled Meds: . acetaminophen  1,000 mg Oral 4 times per day   Or  . acetaminophen (TYLENOL) oral liquid 160 mg/5 mL  1,000 mg Per Tube 4 times per day  . antiseptic oral rinse  7 mL Mouth Rinse QID  . aspirin EC  325 mg Oral Daily   Or  . aspirin  324 mg Per Tube Daily  . bisacodyl  10 mg Oral  Daily   Or  . bisacodyl  10 mg Rectal Daily  . brimonidine  3 drop Both Eyes Daily  . cefUROXime (ZINACEF)  IV  1.5 g Intravenous Q12H  . chlorhexidine  15 mL Mouth Rinse BID  . docusate sodium  200 mg Oral Daily  . famotidine (PEPCID) IV  20 mg Intravenous Q12H  . Influenza vac split quadrivalent PF  0.5 mL Intramuscular Tomorrow-1000  . insulin aspart  0-24 Units Subcutaneous 6 times per day  . latanoprost  1 drop Both Eyes QHS  . metoprolol tartrate  12.5 mg Oral BID   Or  . metoprolol tartrate  12.5 mg Per Tube BID  . [START ON 12/29/2013] pantoprazole  40 mg Oral Daily  . pneumococcal 23 valent vaccine  0.5 mL Intramuscular Tomorrow-1000  . sodium chloride  3 mL Intravenous Q12H   Continuous Infusions: . sodium chloride 20 mL/hr at 12/28/13 0600  . sodium chloride    . sodium chloride 20 mL/hr at 12/28/13 0600  . amiodarone 30 mg/hr (12/28/13 0600)  . dexmedetomidine Stopped (12/28/13 0600)  . DOPamine 5.967 mcg/kg/min (12/28/13 0600)  . lactated ringers 20 mL/hr at 12/28/13 0600  . milrinone 0.3 mcg/kg/min (12/28/13 0600)  . nitroGLYCERIN Stopped (12/27/13 1746)  .  phenylephrine (NEO-SYNEPHRINE) Adult infusion 45.067 mcg/min (12/28/13 0600)   PRN Meds:.albumin human, metoprolol, midazolam, morphine injection, ondansetron (ZOFRAN) IV, oxyCODONE, sodium chloride, traMADol  General appearance: alert, cooperative and no distress Neurologic: intact Heart: pericardial rub, early systic m no m of ai Lungs: diminished breath sounds bibasilar Abdomen: soft, non-tender; bowel sounds normal; no masses,  no organomegaly Extremities: extremities normal, atraumatic, no cyanosis or edema and Homans sign is negative, no sign of DVT Wound: sternum stable  Lab Results: CBC: Recent Labs  12/27/13 2239 12/28/13 0400  WBC 13.2* 8.8  HGB 7.0* 6.7*  HCT 20.9* 19.5*  PLT 62* 56*   BMET:  Recent Labs  12/26/13 1345  12/27/13 1521 12/27/13 1718 12/27/13 2239 12/28/13 0400  NA  133*  < > 134* 140  --  139  K 4.9  < > 4.6 4.3  --  4.8  CL 102  < > 102  --   --  106  CO2 14*  --   --   --   --  21  GLUCOSE 108*  < > 166* 114*  --  113*  BUN 35*  < > 24*  --   --  19  CREATININE 1.01  < > 0.70  --  0.74 0.77  CALCIUM 9.5  --   --   --   --  8.5  < > = values in this interval not displayed.  PT/INR:  Recent Labs  12/26/13 1345  LABPROT 13.5  INR 1.02   Radiology: Dg Chest 2 View  12/26/2013   CLINICAL DATA:  Coronary artery atherosclerosis. Aortic insufficiency. Pre admission for valve replacements.  EXAM: CHEST  2 VIEW  COMPARISON:  Multiple exams, including 12/25/2013  FINDINGS: Existing aortic valve prosthesis observed. Mild enlargement of the cardiopericardial silhouette, without pulmonary edema. Aside from remote granulomatous disease, the lungs appear clear.  IMPRESSION: 1. Mild enlargement of the cardiopericardial silhouette, without edema. Aortic valve prosthesis noted. 2. Old granulomatous disease.   Electronically Signed   By: Sherryl Barters M.D.   On: 12/26/2013 16:47   Dg Chest Port 1 View  12/28/2013   CLINICAL DATA:  Aortic valve replacement.  EXAM: PORTABLE CHEST - 1 VIEW  COMPARISON:  12/27/2013.  FINDINGS: Interim removal of endotracheal tube. Swan-Ganz catheter, mediastinal drainage catheter, left chest tube in stable position. Stable cardiomegaly with normal pulmonary vascularity. Aortic valve replacement. Stable bibasilar atelectasis and/or infiltrates. No pneumothorax. Skin fold right upper chest. No acute osseous abnormality.  IMPRESSION: 1. Interim removal of endotracheal tube. Remaining support apparatus in stable position. 2. Stable bibasilar atelectasis and/or infiltrates. 3. Stable cardiomegaly, no pulmonary venous congestion. Aortic valve replacement.   Electronically Signed   By: Marcello Moores  Register   On: 12/28/2013 07:26   Dg Chest Port 1 View  12/27/2013   CLINICAL DATA:  Post aortic valve replacement.  EXAM: PORTABLE CHEST - 1 VIEW   COMPARISON:  12/26/2013  FINDINGS: Endotracheal tube is 5.0 cm above the carina. Swan-Ganz catheter in the right pulmonary artery. Nasogastric tube extends into the abdomen. Median sternotomy with a prosthetic aortic valve. Densities at the left lung base are most compatible with volume loss. Evidence for a left chest tube and mediastinal tube. Negative for a large pneumothorax.  IMPRESSION: Support apparatuses without a pneumothorax.  Left basilar densities are most compatible with volume loss.   Electronically Signed   By: Markus Daft M.D.   On: 12/27/2013 18:25     Assessment/Plan: S/P Procedure(s) (LRB): REDO AORTIC  VALVE REPLACEMENT WITH 23MM MAGNA EASE PERICARDIAL TISSUE VALVE (N/A) TRANSESOPHAGEAL ECHOCARDIOGRAM (TEE) (N/A) CORONARY ARTERY BYPASS GRAFTING TIMES TWO USING RIGHT LEG SAPHENOUS VEIN TO RIGHT CORONARY ARTERY AND LEFT INTERNAL MAMMARY ARTERY TO LEFT ANTERIOR DESCENDING ARTERY (N/A) Mobilize Diuresis d/c tubes/lines Continue foley due to strict I&O, patient in ICU and urinary output monitoring See progression orders Expected Acute  Blood - loss Anemia and preop anemia , iron deficiency thatr needs work up Will transfuse Wean off milinone Av paced will ask EP to evaluate rhythm , preop sinus bradycardia with pvc's   Omarion Minnehan B 12/28/2013 8:27 AM

## 2013-12-28 NOTE — Procedures (Signed)
Extubation Procedure Note  Patient Details:   Name: [name] [name] DOB: [birth year] MRN: [id number]   Airway Documentation:     Evaluation  O2 sats: stable throughout Complications: No apparent complications Patient did tolerate procedure well. Bilateral Breath Sounds: Clear, Diminished   Yes   Audible cuff leak noted. Extubated to [place] 5lpm (humidified). No complications noted. No stridor detected.   [place] 12/28/2013, 12:10 AM

## 2013-12-28 NOTE — Progress Notes (Signed)
Patient ID: [name] [name], male   DOB: 1940-11-08, 73 y.o.   MRN: [id number]  SICU Evening Rounds:  Hemodynamically stable on dop 3 AV paced 88 Urine ouput good Up in chair and alert   BMET    Component Value Date/Time   NA 136* 12/28/2013 1556   K 5.3 12/28/2013 1556   CL 101 12/28/2013 1556   CO2 21 12/28/2013 0400   GLUCOSE 150* 12/28/2013 1556   BUN 20 12/28/2013 1556   CREATININE 0.90 12/28/2013 1556   CALCIUM 8.5 12/28/2013 0400   GFRNONAA 87* 12/28/2013 1528   GFRAA >90 12/28/2013 1528    CBC    Component Value Date/Time   WBC 10.6* 12/28/2013 1528   RBC 2.76* 12/28/2013 1528   RBC 3.74* 12/20/2013 1534   HGB 7.8* 12/28/2013 1556   HCT 23.0* 12/28/2013 1556   PLT 62* 12/28/2013 1528   MCV 84.8 12/28/2013 1528   MCH 28.6 12/28/2013 1528   MCHC 33.8 12/28/2013 1528   RDW 13.8 12/28/2013 1528   LYMPHSABS 1.6 12/20/2013 1534   MONOABS 0.5 12/20/2013 1534   EOSABS 0.1 12/20/2013 1534   BASOSABS 0.0 12/20/2013 1534    A/P: stable hemodynamics  Acute postop blood loss anemia: received 1 unit prbc's this am for hgb 6.7 and it came up to 7.9. Will give a second unit.   Thrombocytopenia: slightly improved from this am.

## 2013-12-28 NOTE — Progress Notes (Signed)
Utilization Review Completed.Donne Anon T12/17/2015

## 2013-12-29 ENCOUNTER — Encounter (HOSPITAL_COMMUNITY): Payer: Self-pay | Admitting: Cardiothoracic Surgery

## 2013-12-29 ENCOUNTER — Inpatient Hospital Stay (HOSPITAL_COMMUNITY): Payer: Medicare Other

## 2013-12-29 DIAGNOSIS — I440 Atrioventricular block, first degree: Secondary | ICD-10-CM

## 2013-12-29 LAB — GLUCOSE, CAPILLARY
Glucose-Capillary: 106 mg/dL — ABNORMAL HIGH (ref 70–99)
Glucose-Capillary: 117 mg/dL — ABNORMAL HIGH (ref 70–99)
Glucose-Capillary: 109 mg/dL — ABNORMAL HIGH (ref 70–99)
Glucose-Capillary: 112 mg/dL — ABNORMAL HIGH (ref 70–99)
Glucose-Capillary: 141 mg/dL — ABNORMAL HIGH (ref 70–99)
Glucose-Capillary: 95 mg/dL (ref 70–99)

## 2013-12-29 LAB — CBC
HCT: 25.3 % — ABNORMAL LOW (ref 39.0–52.0)
Hemoglobin: 8.6 g/dL — ABNORMAL LOW (ref 13.0–17.0)
MCH: 29 pg (ref 26.0–34.0)
MCHC: 34 g/dL (ref 30.0–36.0)
MCV: 85.2 fL (ref 78.0–100.0)
Platelets: 56 10*3/uL — ABNORMAL LOW (ref 150–400)
RBC: 2.97 MIL/uL — ABNORMAL LOW (ref 4.22–5.81)
RDW: 13.8 % (ref 11.5–15.5)
WBC: 11.5 10*3/uL — ABNORMAL HIGH (ref 4.0–10.5)

## 2013-12-29 LAB — BASIC METABOLIC PANEL
Anion gap: 8 (ref 5–15)
BUN: 21 mg/dL (ref 6–23)
CO2: 27 mEq/L (ref 19–32)
Calcium: 8.8 mg/dL (ref 8.4–10.5)
Chloride: 99 mEq/L (ref 96–112)
Creatinine, Ser: 0.76 mg/dL (ref 0.50–1.35)
GFR calc Af Amer: >90 mL/min (ref >90)
GFR calc non Af Amer: 88 mL/min — ABNORMAL LOW (ref >90)
Glucose, Bld: 122 mg/dL — ABNORMAL HIGH (ref 70–99)
Potassium: 4.7 mEq/L (ref 3.7–5.3)
Sodium: 134 mEq/L — ABNORMAL LOW (ref 137–147)

## 2013-12-29 MED ORDER — BRIMONIDINE TARTRATE 0.2 % OP SOLN
1.0000 [drp] | Freq: Three times a day (TID) | OPHTHALMIC | Status: DC
  Administered 2013-12-29 – 2014-01-03 (×16): 1 [drp] via OPHTHALMIC
  Filled 2013-12-29 (×2): qty 5

## 2013-12-29 MED ORDER — ASPIRIN EC 81 MG PO TBEC
81.0000 mg | DELAYED_RELEASE_TABLET | Freq: Every day | ORAL | Status: DC
  Administered 2013-12-29 – 2013-12-31 (×3): 81 mg via ORAL
  Filled 2013-12-29 (×4): qty 1

## 2013-12-29 MED ORDER — ASPIRIN 81 MG PO CHEW: 81.0000 mg | CHEWABLE_TABLET | Freq: Every day | ORAL | Status: DC

## 2013-12-29 NOTE — Progress Notes (Signed)
Pt. Was confused earlier trying to get out of bed, oriented x3, disoriented to place he thinks he was in the nursing home. HR up to the 130's converted to atrial fibrillation. SBP 120's-140's. Complained of chest pain O2 administered, morphine given 2mg . IV and lopressor given 5mg . IV PRN. HR slows down to the 80's and 90's AV paced. Chest pain was relieved. Dr. Madaline Guthrie made aware will cont. to monitor pt.

## 2013-12-29 NOTE — Progress Notes (Signed)
Patient ID: [name] [name], male   DOB: 1940-11-23, 73 y.o.   MRN: [id number] TCTS DAILY ICU PROGRESS NOTE                   [address]            [place] [address]          [phone]   2 Days Post-Op Procedure(s) (LRB): REDO AORTIC VALVE REPLACEMENT WITH 23MM MAGNA EASE PERICARDIAL TISSUE VALVE (N/A) TRANSESOPHAGEAL ECHOCARDIOGRAM (TEE) (N/A) CORONARY ARTERY BYPASS GRAFTING TIMES TWO USING RIGHT LEG SAPHENOUS VEIN TO RIGHT CORONARY ARTERY AND LEFT INTERNAL MAMMARY ARTERY TO LEFT ANTERIOR DESCENDING ARTERY (N/A)  Total Length of Stay:  LOS: 2 days   Subjective: Awake alert neuro intact, up to chair  Objective: Vital signs in last 24 hours: Temp:  [97.6 F (36.4 C)-98.5 F (36.9 C)] 98.1 F (36.7 C) (12/18 0724) Pulse Rate:  [69-96] 89 (12/18 0800) Cardiac Rhythm:  [-] A-V Sequential paced (12/18 0800) Resp:  [12-26] 20 (12/18 0800) BP: (100-134)/(60-92) 104/83 mmHg (12/18 0800) SpO2:  [93 %-100 %] 96 % (12/18 0800) Arterial Line BP: (110-154)/(49-76) 118/55 mmHg (12/18 0800) Weight:  [164 lb 14.5 oz (74.8 kg)-168 lb 14 oz (76.6 kg)] 164 lb 14.5 oz (74.8 kg) (12/18 0500)  Filed Weights   12/28/13 0630 12/28/13 1428 12/29/13 0500  Weight: 168 lb 14 oz (76.6 kg) 168 lb 14 oz (76.6 kg) 164 lb 14.5 oz (74.8 kg)    Weight change: 9 lb 4.4 oz (4.206 kg)   Hemodynamic parameters for last 24 hours:    Intake/Output from previous day: 12/17 0701 - 12/18 0700 In: 2427.3 [P.O.:720; I.V.:937.3; Blood:670; IV Piggyback:100] Out: 9735 [Urine:3740; Chest Tube:420]  Intake/Output this shift: Total I/O In: 24.1 [I.V.:24.1] Out: 245 [Urine:225; Chest Tube:20]  Current Meds: Scheduled Meds: . acetaminophen  1,000 mg Oral 4 times per day   Or  . acetaminophen (TYLENOL) oral liquid 160 mg/5 mL  1,000 mg Per Tube 4 times per day  . aspirin EC  325 mg Oral Daily   Or  . aspirin  324 mg Per Tube Daily  . bisacodyl  10 mg Oral Daily   Or  . bisacodyl  10 mg  Rectal Daily  . brimonidine  3 drop Both Eyes Daily  . docusate sodium  200 mg Oral Daily  . Influenza vac split quadrivalent PF  0.5 mL Intramuscular Tomorrow-1000  . insulin aspart  0-24 Units Subcutaneous 6 times per day  . latanoprost  1 drop Both Eyes QHS  . pantoprazole  40 mg Oral Daily  . pneumococcal 23 valent vaccine  0.5 mL Intramuscular Tomorrow-1000  . sodium chloride  3 mL Intravenous Q12H   Continuous Infusions: . sodium chloride Stopped (12/28/13 1404)  . sodium chloride    . sodium chloride 20 mL/hr at 12/28/13 1400  . DOPamine 2 mcg/kg/min (12/29/13 0800)  . lactated ringers 20 mL/hr at 12/28/13 2300  . nitroGLYCERIN Stopped (12/27/13 1746)  . phenylephrine (NEO-SYNEPHRINE) Adult infusion Stopped (12/28/13 1442)   PRN Meds:.metoprolol, morphine injection, ondansetron (ZOFRAN) IV, oxyCODONE, sodium chloride, traMADol  General appearance: alert, cooperative and no distress Neurologic: intact Heart: regular rate and rhythm and rub decreased from yesterday Lungs: diminished breath sounds bibasilar Abdomen: soft, non-tender; bowel sounds normal; no masses,  no organomegaly Extremities: extremities normal, atraumatic, no cyanosis or edema and Homans sign is negative, no sign of DVT Wound: sternum stable  Lab Results: CBC: Recent Labs  12/28/13 1528 12/28/13 1556 12/29/13 0450  WBC 10.6*  --  11.5*  HGB 7.9* 7.8* 8.6*  HCT 23.4* 23.0* 25.3*  PLT 62*  --  56*   BMET:  Recent Labs  12/28/13 0400  12/28/13 1556 12/29/13 0450  NA 139  --  136* 134*  K 4.8  --  5.3 4.7  CL 106  --  101 99  CO2 21  --   --  27  GLUCOSE 113*  --  150* 122*  BUN 19  --  20 21  CREATININE 0.77  < > 0.90 0.76  CALCIUM 8.5  --   --  8.8  < > = values in this interval not displayed.  PT/INR:  Recent Labs  12/26/13 1345  LABPROT 13.5  INR 1.02   Radiology: Dg Chest Port 1 View  12/29/2013   CLINICAL DATA:  Postop revision of aortic valve replacement.  EXAM: PORTABLE  CHEST - 1 VIEW  COMPARISON:  12/28/2013 and 12/27/2013.  FINDINGS: 0607 hr. The right IJ Swan-Ganz catheter has been removed. Right IJ sheath remains in the right brachiocephalic vein. A left chest tube remains in place. There is a small left apical pneumothorax, less than 10%. Bibasilar atelectasis and probable small pleural effusions remain. The heart size and mediastinal contours are stable.  IMPRESSION: Small left apical pneumothorax with bibasilar atelectasis and probable small pleural effusions.   Electronically Signed   By: Camie Patience M.D.   On: 12/29/2013 07:56   Dg Chest Port 1 View  12/28/2013   CLINICAL DATA:  Aortic valve replacement.  EXAM: PORTABLE CHEST - 1 VIEW  COMPARISON:  12/27/2013.  FINDINGS: Interim removal of endotracheal tube. Swan-Ganz catheter, mediastinal drainage catheter, left chest tube in stable position. Stable cardiomegaly with normal pulmonary vascularity. Aortic valve replacement. Stable bibasilar atelectasis and/or infiltrates. No pneumothorax. Skin fold right upper chest. No acute osseous abnormality.  IMPRESSION: 1. Interim removal of endotracheal tube. Remaining support apparatus in stable position. 2. Stable bibasilar atelectasis and/or infiltrates. 3. Stable cardiomegaly, no pulmonary venous congestion. Aortic valve replacement.   Electronically Signed   By: Marcello Moores  Register   On: 12/28/2013 07:26   Dg Chest Port 1 View  12/27/2013   CLINICAL DATA:  Post aortic valve replacement.  EXAM: PORTABLE CHEST - 1 VIEW  COMPARISON:  12/26/2013  FINDINGS: Endotracheal tube is 5.0 cm above the carina. Swan-Ganz catheter in the right pulmonary artery. Nasogastric tube extends into the abdomen. Median sternotomy with a prosthetic aortic valve. Densities at the left lung base are most compatible with volume loss. Evidence for a left chest tube and mediastinal tube. Negative for a large pneumothorax.  IMPRESSION: Support apparatuses without a pneumothorax.  Left basilar densities  are most compatible with volume loss.   Electronically Signed   By: Markus Daft M.D.   On: 12/27/2013 18:25     Assessment/Plan: S/P Procedure(s) (LRB): REDO AORTIC VALVE REPLACEMENT WITH 23MM MAGNA EASE PERICARDIAL TISSUE VALVE (N/A) TRANSESOPHAGEAL ECHOCARDIOGRAM (TEE) (N/A) CORONARY ARTERY BYPASS GRAFTING TIMES TWO USING RIGHT LEG SAPHENOUS VEIN TO RIGHT CORONARY ARTERY AND LEFT INTERNAL MAMMARY ARTERY TO LEFT ANTERIOR DESCENDING ARTERY (N/A) Mobilize Diuresis d/c tubes/lines Plan for transfer to step-down: see transfer orders HGB improved with transfusion yesterday, still thrombocytopenia, avoid heparin for now Wean off dopamine Cardiology to see concerning rhythm    Khylin Gutridge B 12/29/2013 9:01 AM

## 2013-12-29 NOTE — Progress Notes (Signed)
POD # 2 Up in chair  BP 144/83 mmHg  Pulse 60  Temp(Src) 98 F (36.7 C) (Oral)  Resp 16  Ht 5\' 11"  (1.803 m)  Wt 164 lb 14.5 oz (74.8 kg)  BMI 23.01 kg/m2  SpO2 100%   Intake/Output Summary (Last 24 hours) at 12/29/13 1921 Last data filed at 12/29/13 1700  Gross per 24 hour  Intake 1318.3 ml  Output   2605 ml  Net -1286.7 ml    CBG well controlled  No PM labs

## 2013-12-29 NOTE — Consult Note (Signed)
CARDIOLOGY CONSULT NOTE   Patient ID: [name] [name] MRN: [id number], DOB/AGE: [birth year]   Admit date: 12/27/2013 Date of Consult: 12/29/2013   Primary Physician: Leonides Sake, MD Primary Cardiologist: Dr. Mare Ferrari  Pt. 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  Patient also underwent CABG grafting 2 with a saphenous vein graft to the right coronary artery and  left internal mammary graft to the LAD.  Problem List  Past Medical History  Diagnosis Date  . Aortic stenosis   . Hyperlipidemia   . Heart murmur   . Sleep apnea     mild to moderate  . Glaucoma     uses eye drops daily  . CHF (congestive heart failure)     takes Furosemide daily    Past Surgical History  Procedure Laterality Date  . Cardiac catheterization  02/08/2003    severe aortic stenosis,normal left ventricular function  . 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 Inguinal hernia repair  04/2003  . Left and right heart catheterization with coronary angiogram N/A 12/18/2013    Procedure: LEFT AND RIGHT HEART CATHETERIZATION WITH CORONARY ANGIOGRAM;  Surgeon: Peter M Martinique, MD;  Location: [facility] CATH LAB;  Service: Cardiovascular;  Laterality: N/A;     Allergies  No Known Allergies  HPI   This 73 year old gentleman has a prior history of severe aortic stenosis and in 2005 he underwent aortic valve replacement with a tissue valve by Dr. Servando Snare.  The patient did well for the next 10 years.  Recently however he has developed evidence of failing aortic valve prosthetic valve with development of aortic stenosis and aortic insufficiency and clinical symptoms and signs of congestive heart failure.  He underwent repeat cardiac catheterization which showed that he now also has ischemic heart disease requiring CABG.  On 12/27/13 the patient underwent redo aortic valve replacement with a 23 mm magnetic E's  pericardial tissue valve and underwent coronary artery bypass grafting 2 using right leg saphenous vein to the right coronary artery and left internal mammary artery to the LAD artery.  Postoperatively his hemodynamics have been good.  He has required transfusion of packed cells for anemia.  EKG today has shown normal sinus rhythm with first-degree AV block and occasional PVCs.  The patient denies any significant chest discomfort.  He is not having any respiratory insufficiency.  He is oxygenating normally on room air.  Chest x-ray today reviewed by me shows cardiomegaly and a small left apical pneumothorax and no evidence to suggest congestive heart failure.  Inpatient Medications  . acetaminophen  1,000 mg Oral 4 times per day   Or  . acetaminophen (TYLENOL) oral liquid 160 mg/5 mL  1,000 mg Per Tube 4 times per day  . aspirin EC  81 mg Oral Daily   Or  . aspirin  81 mg Per Tube Daily  . bisacodyl  10 mg Oral Daily   Or  . bisacodyl  10 mg Rectal Daily  . brimonidine  3 drop Both Eyes Daily  . docusate sodium  200 mg Oral Daily  . Influenza vac split quadrivalent PF  0.5 mL Intramuscular Tomorrow-1000  . insulin aspart  0-24 Units Subcutaneous 6 times per day  . latanoprost  1 drop Both Eyes QHS  . pantoprazole  40 mg Oral Daily  . pneumococcal 23 valent vaccine  0.5 mL Intramuscular Tomorrow-1000  . sodium chloride  3 mL Intravenous Q12H    Family History Family History  Problem Relation Age of Onset  . Heart disease Mother   . Cancer Father      Social History History   Social History  . Marital Status: Married    Spouse Name: N/A    Number of Children: N/A  . Years of Education: N/A   Occupational History  . Not on file.   Social History Main Topics  . Smoking status: Never Smoker   . Smokeless tobacco: Never Used  . Alcohol Use: No  . Drug Use: No  . Sexual Activity: Not Currently   Other Topics Concern  . Not on file   Social History Narrative     Review  of Systems  General:  No chills, fever, night sweats or weight changes.  Cardiovascular:  No chest pain, dyspnea on exertion, edema, orthopnea, palpitations, paroxysmal nocturnal dyspnea. Dermatological: No rash, lesions/masses Respiratory: No cough, dyspnea Urologic: No hematuria, dysuria Abdominal:   No nausea, vomiting, diarrhea, bright red blood per rectum, melena, or hematemesis Neurologic:  No visual changes, wkns, changes in mental status. All other systems reviewed and are otherwise negative except as noted above.  Physical Exam  Blood pressure 104/83, pulse 89, temperature 98.1 F (36.7 C), temperature source Oral, resp. rate 20, height 5\' 11"  (1.803 m), weight 164 lb 14.5 oz (74.8 kg), SpO2 96 %.  General: Pleasant, NAD Psych: Normal affect. Neuro: Alert and oriented X 3. Moves all extremities spontaneously. HEENT: Normal  Neck: Supple without bruits or JVD. Lungs:  Resp regular and unlabored, CTA. Heart: RRR no s3, s4, or murmurs.  No pericardial rub Abdomen: Soft, non-tender, non-distended, BS + x 4.  Extremities: No clubbing, cyanosis or edema. DP/PT/Radials 2+ and equal bilaterally.  Labs  No results for input(s): CKTOTAL, CKMB, TROPONINI in the last 72 hours. Lab Results  Component Value Date   WBC 11.5* 12/29/2013   HGB 8.6* 12/29/2013   HCT 25.3* 12/29/2013   MCV 85.2 12/29/2013   PLT 56* 12/29/2013    Recent Labs Lab 12/26/13 1345  12/29/13 0450  NA 133*  < > 134*  K 4.9  < > 4.7  CL 102  < > 99  CO2 14*  < > 27  BUN 35*  < > 21  CREATININE 1.01  < > 0.76  CALCIUM 9.5  < > 8.8  PROT 7.0  --   --   BILITOT 0.5  --   --   ALKPHOS 86  --   --   ALT 17  --   --   AST 27  --   --   GLUCOSE 108*  < > 122*  < > = values in this interval not displayed. Lab Results  Component Value Date   CHOL 194 03/11/2011   HDL 61.80 03/11/2011   LDLCALC 124* 03/11/2011   TRIG 41.0 03/11/2011   No results found for: DDIMER  Radiology/Studies  Dg Chest 2  View  12/26/2013   CLINICAL DATA:  Coronary artery atherosclerosis. Aortic insufficiency. Pre admission for valve replacements.  EXAM: CHEST  2 VIEW  COMPARISON:  Multiple exams, including 12/25/2013  FINDINGS: Existing aortic valve prosthesis observed. Mild enlargement of the cardiopericardial silhouette, without pulmonary edema. Aside from remote granulomatous disease, the lungs appear clear.  IMPRESSION: 1. Mild enlargement of the cardiopericardial silhouette, without edema. Aortic valve prosthesis noted. 2. Old granulomatous disease.   Electronically Signed   By: Sherryl Barters M.D.  On: 12/26/2013 16:47   Dg Chest 2 View  12/12/2013   CLINICAL DATA:  1-2 month history of shortness of breath now acutely worse especially with activity  EXAM: CHEST  2 VIEW  COMPARISON:  PA and lateral chest of November 14, 2012  FINDINGS: There are small bilateral pleural effusions blunting the costophrenic angles. Coarse lung markings are present in the lower lobes bilaterally. The cardiac silhouette is enlarged. The pulmonary vascularity is not engorged. The patient has undergone previous median sternotomy and aortic valve replacement. The bony thorax exhibits no acute abnormality. There are 7 intact sternal wires.  IMPRESSION: There are small bilateral pleural effusions with bibasilar atelectasis or possibly pneumonia. There is no pulmonary vascular congestion or interstitial edema.   Electronically Signed   By: Clair  Martinique   On: 12/12/2013 17:06   Dg Chest Port 1 View  12/29/2013   CLINICAL DATA:  Postop revision of aortic valve replacement.  EXAM: PORTABLE CHEST - 1 VIEW  COMPARISON:  12/28/2013 and 12/27/2013.  FINDINGS: 0607 hr. The right IJ Swan-Ganz catheter has been removed. Right IJ sheath remains in the right brachiocephalic vein. A left chest tube remains in place. There is a small left apical pneumothorax, less than 10%. Bibasilar atelectasis and probable small pleural effusions remain. The heart size  and mediastinal contours are stable.  IMPRESSION: Small left apical pneumothorax with bibasilar atelectasis and probable small pleural effusions.   Electronically Signed   By: Camie Patience M.D.   On: 12/29/2013 07:56   Dg Chest Port 1 View  12/28/2013   CLINICAL DATA:  Aortic valve replacement.  EXAM: PORTABLE CHEST - 1 VIEW  COMPARISON:  12/27/2013.  FINDINGS: Interim removal of endotracheal tube. Swan-Ganz catheter, mediastinal drainage catheter, left chest tube in stable position. Stable cardiomegaly with normal pulmonary vascularity. Aortic valve replacement. Stable bibasilar atelectasis and/or infiltrates. No pneumothorax. Skin fold right upper chest. No acute osseous abnormality.  IMPRESSION: 1. Interim removal of endotracheal tube. Remaining support apparatus in stable position. 2. Stable bibasilar atelectasis and/or infiltrates. 3. Stable cardiomegaly, no pulmonary venous congestion. Aortic valve replacement.   Electronically Signed   By: Marcello Moores  Register   On: 12/28/2013 07:26   Dg Chest Port 1 View  12/27/2013   CLINICAL DATA:  Post aortic valve replacement.  EXAM: PORTABLE CHEST - 1 VIEW  COMPARISON:  12/26/2013  FINDINGS: Endotracheal tube is 5.0 cm above the carina. Swan-Ganz catheter in the right pulmonary artery. Nasogastric tube extends into the abdomen. Median sternotomy with a prosthetic aortic valve. Densities at the left lung base are most compatible with volume loss. Evidence for a left chest tube and mediastinal tube. Negative for a large pneumothorax.  IMPRESSION: Support apparatuses without a pneumothorax.  Left basilar densities are most compatible with volume loss.   Electronically Signed   By: Markus Daft M.D.   On: 12/27/2013 18:25   Ct Angio Chest Aorta W/cm &/or Wo/cm  12/25/2013   CLINICAL DATA:  Aortic stenosis. Evaluate aortic root size. Prior aortic valve replacement 02/08/2013.  EXAM: CT ANGIOGRAPHY CHEST WITH CONTRAST  TECHNIQUE: Multidetector CT imaging of the chest  was performed using the standard protocol during bolus administration of intravenous contrast. Multiplanar CT image reconstructions and MIPs were obtained to evaluate the vascular anatomy.  CONTRAST:  66mL OMNIPAQUE IOHEXOL 350 MG/ML SOLN  COMPARISON:  Plain film 12/12/2013.  FINDINGS: Lungs/Pleura: 7 mm right lower lobe lung nodule which is sub solid in morphology, including on image 46 of series 5.  Mild thickening along the right major fissure on image 35. Posterior right upper lobe calcified granuloma. Left base scarring.  No pleural fluid.  Minimal left-sided pleural thickening is seen.  Heart/Mediastinum: Atherosclerosis within the great vessels, which are normal in caliber. Status post aortic valve repair. Tortuous thoracic aorta. Ascending aorta best evaluated on coronal reformatted images. The aorta measures 3.2 cm at the sinuses of Valsalva. 2.5 cm at the sino-tubular junction. 3.2 cm approximately 3 cm above the sino-tubular junction. No dissection.  Moderate cardiomegaly. Multivessel coronary artery atherosclerosis. No pericardial effusion. Multiple small middle mediastinal nodes. None are pathologic by size criteria. No hilar adenopathy.  Upper Abdomen: Suspicion of gallstones. Normal imaged portions of the liver, spleen, stomach, adrenal glands, left kidney. Suspect incompletely imaged right renal cyst. Low-density foci within the pancreatic body and head are likely areas of fatty atrophy/replacement.  Bones/Musculoskeletal:  No acute osseous abnormality.  Review of the MIP images confirms the above findings.  IMPRESSION: 1. Tortuous thoracic aorta, without evidence of aortic aneurysm. 2.  Atherosclerosis, including within the coronary arteries. 3. Probable cholelithiasis. 4. 7 mm sub solid right lower lobe pulmonary nodule. Per consensus criteria, this warrants followup chest CT at 3 months. This recommendation follows the consensus statement: Recommendations for the Management of Subsolid Pulmonary  Nodules Detected at CT: A Statement from the [place]. Radiology [id number].   Electronically Signed   By: Abigail Miyamoto M.D.   On: 12/25/2013 16:05    ECG  Sinus rhythm with 1st degree A-V block with frequent Premature ventricular complexes Left ventricular hypertrophy with repolarization abnormality Abnormal ECG  ASSESSMENT AND PLAN  1.  Status post redo aortic valve replacement for failed prosthetic aortic valve and status post CABG 2, overall doing very well. 2.  First-degree AV block.  No associated symptoms.  Review of his old outpatient EKGs dating back to 2014 show that he has had a chronic first degree AV block in the range of 0.24 seconds. He is not presently on any calcium blockers or beta blockers which would aggravate his AV block. 3.  Frequent PVCs, asymptomatic.  Continue to watch.  Should improve as hemoglobin stabilizes.  Potassium levels are normal.  Recommendation: Continue current therapy.  Advance activity as tolerated.  Signed, Darlin Coco, MD  12/29/2013, 10:05 AM

## 2013-12-30 ENCOUNTER — Inpatient Hospital Stay (HOSPITAL_COMMUNITY): Payer: Medicare Other

## 2013-12-30 DIAGNOSIS — I350 Nonrheumatic aortic (valve) stenosis: Secondary | ICD-10-CM

## 2013-12-30 LAB — BASIC METABOLIC PANEL
Anion gap: 11 (ref 5–15)
BUN: 26 mg/dL — ABNORMAL HIGH (ref 6–23)
CO2: 24 mEq/L (ref 19–32)
Calcium: 8.4 mg/dL (ref 8.4–10.5)
Chloride: 98 mEq/L (ref 96–112)
Creatinine, Ser: 0.72 mg/dL (ref 0.50–1.35)
GFR calc Af Amer: >90 mL/min (ref >90)
GFR calc non Af Amer: >90 mL/min (ref >90)
Glucose, Bld: 115 mg/dL — ABNORMAL HIGH (ref 70–99)
Potassium: 4.2 mEq/L (ref 3.7–5.3)
Sodium: 133 mEq/L — ABNORMAL LOW (ref 137–147)

## 2013-12-30 LAB — GLUCOSE, CAPILLARY
Glucose-Capillary: 100 mg/dL — ABNORMAL HIGH (ref 70–99)
Glucose-Capillary: 104 mg/dL — ABNORMAL HIGH (ref 70–99)
Glucose-Capillary: 111 mg/dL — ABNORMAL HIGH (ref 70–99)
Glucose-Capillary: 129 mg/dL — ABNORMAL HIGH (ref 70–99)
Glucose-Capillary: 84 mg/dL (ref 70–99)

## 2013-12-30 LAB — CBC
HCT: 25.7 % — ABNORMAL LOW (ref 39.0–52.0)
Hemoglobin: 8.8 g/dL — ABNORMAL LOW (ref 13.0–17.0)
MCH: 29.8 pg (ref 26.0–34.0)
MCHC: 34.2 g/dL (ref 30.0–36.0)
MCV: 87.1 fL (ref 78.0–100.0)
Platelets: 64 10*3/uL — ABNORMAL LOW (ref 150–400)
RBC: 2.95 MIL/uL — ABNORMAL LOW (ref 4.22–5.81)
RDW: 13.7 % (ref 11.5–15.5)
WBC: 10.1 10*3/uL (ref 4.0–10.5)

## 2013-12-30 MED ORDER — AMIODARONE HCL IN DEXTROSE 360-4.14 MG/200ML-% IV SOLN
30.0000 mg/h | INTRAVENOUS | Status: AC
  Filled 2013-12-30 (×5): qty 200

## 2013-12-30 MED ORDER — DIPHENHYDRAMINE HCL 25 MG PO CAPS: 25.0000 mg | ORAL_CAPSULE | Freq: Every evening | ORAL | Status: DC | PRN

## 2013-12-30 MED ORDER — AMIODARONE HCL IN DEXTROSE 360-4.14 MG/200ML-% IV SOLN
INTRAVENOUS | Status: AC
  Filled 2013-12-30: qty 200

## 2013-12-30 MED ORDER — ZOLPIDEM TARTRATE 5 MG PO TABS: 5.0000 mg | ORAL_TABLET | Freq: Every evening | ORAL | Status: DC | PRN

## 2013-12-30 MED ORDER — AMIODARONE HCL IN DEXTROSE 360-4.14 MG/200ML-% IV SOLN
60.0000 mg/h | INTRAVENOUS | Status: AC
  Administered 2013-12-30 (×2): 60 mg/h via INTRAVENOUS
  Filled 2013-12-30: qty 200

## 2013-12-30 MED ORDER — LIDOCAINE HCL (PF) 1 % IJ SOLN
INTRAMUSCULAR | Status: DC
Start: 2013-12-30 — End: 2013-12-30
  Filled 2013-12-30: qty 10

## 2013-12-30 MED ORDER — AMIODARONE LOAD VIA INFUSION
150.0000 mg | Freq: Once | INTRAVENOUS | Status: AC
  Administered 2013-12-30: 150 mg via INTRAVENOUS
  Filled 2013-12-30: qty 83.34

## 2013-12-30 MED ORDER — LIDOCAINE HCL (PF) 1 % IJ SOLN
30.0000 mL | Freq: Once | INTRAMUSCULAR | Status: AC
  Administered 2013-12-30: 30 mL via INTRADERMAL
  Filled 2013-12-30: qty 30

## 2013-12-30 MED ORDER — LIDOCAINE HCL (PF) 1 % IJ SOLN
INTRAMUSCULAR | Status: AC
  Filled 2013-12-30: qty 5

## 2013-12-30 MED ORDER — MIDAZOLAM HCL 2 MG/2ML IJ SOLN
INTRAMUSCULAR | Status: AC
  Administered 2013-12-30: 2 mg
  Filled 2013-12-30: qty 2

## 2013-12-30 MED ORDER — INSULIN ASPART 100 UNIT/ML ~~LOC~~ SOLN: 0.0000 [IU] | Freq: Three times a day (TID) | SUBCUTANEOUS | Status: DC

## 2013-12-30 NOTE — Progress Notes (Signed)
Pt. Was very restless and trying to get out of bed and still confused disoriented to place and time ans situation. Needs to reorient all the time. Trying to get hold of his wife and pt. Is so worry and concern. Explained to patient his wife is at home and sleeping. Still wants to talk to his wife, called the daughter and finally was able to update about patients confusion. Wife also called and was able to talk to the patient. Will update family and will call if needed at the bedside to calm the patient. Pt. Is quiet and calm at present with a sitter at the bedside.

## 2013-12-30 NOTE — Progress Notes (Signed)
3 Days Post-Op Procedure(s) (LRB): REDO AORTIC VALVE REPLACEMENT WITH 23MM MAGNA EASE PERICARDIAL TISSUE VALVE (N/A) TRANSESOPHAGEAL ECHOCARDIOGRAM (TEE) (N/A) CORONARY ARTERY BYPASS GRAFTING TIMES TWO USING RIGHT LEG SAPHENOUS VEIN TO RIGHT CORONARY ARTERY AND LEFT INTERNAL MAMMARY ARTERY TO LEFT ANTERIOR DESCENDING ARTERY (N/A) Subjective: Confused overnight His family is with him this AM. He is pleasant and cooperative, but intermittently confused Some incisional pain  Objective: Vital signs in last 24 hours: Temp:  [97.9 F (36.6 C)-98.1 F (36.7 C)] 97.9 F (36.6 C) (12/19 0000) Pulse Rate:  [25-119] 63 (12/19 0600) Cardiac Rhythm:  [-] A-V Sequential paced (12/19 0400) Resp:  [11-28] 22 (12/19 0600) BP: (104-156)/(69-127) 133/108 mmHg (12/19 0600) SpO2:  [85 %-100 %] 96 % (12/19 0600) Arterial Line BP: (118)/(55) 118/55 mmHg (12/18 0900)  Hemodynamic parameters for last 24 hours:    Intake/Output from previous day: 12/18 0701 - 12/19 0700 In: 724.1 [P.O.:340; I.V.:384.1] Out: 1280 [Urine:1220; Chest Tube:60] Intake/Output this shift:    General appearance: alert and cooperative Neurologic: no focal motor deficit Heart: regularly irregular rhythm Lungs: clear to auscultation bilaterally  Lab Results:  Recent Labs  12/29/13 0450 12/30/13 0430  WBC 11.5* 10.1  HGB 8.6* 8.8*  HCT 25.3* 25.7*  PLT 56* 64*   BMET:  Recent Labs  12/29/13 0450 12/30/13 0430  NA 134* 133*  K 4.7 4.2  CL 99 98  CO2 27 24  GLUCOSE 122* 115*  BUN 21 26*  CREATININE 0.76 0.72  CALCIUM 8.8 8.4    PT/INR: No results for input(s): LABPROT, INR in the last 72 hours. ABG    Component Value Date/Time   PHART 7.369 12/28/2013 0104   HCO3 22.8 12/28/2013 0104   TCO2 21 12/28/2013 1556   ACIDBASEDEF 2.0 12/28/2013 0104   O2SAT 99.0 12/28/2013 0104   CBG (last 3)   Recent Labs  12/29/13 1954 12/29/13 2353 12/30/13 0444  GLUCAP 95 100* 111*    Assessment/Plan: S/P  Procedure(s) (LRB): REDO AORTIC VALVE REPLACEMENT WITH 23MM MAGNA EASE PERICARDIAL TISSUE VALVE (N/A) TRANSESOPHAGEAL ECHOCARDIOGRAM (TEE) (N/A) CORONARY ARTERY BYPASS GRAFTING TIMES TWO USING RIGHT LEG SAPHENOUS VEIN TO RIGHT CORONARY ARTERY AND LEFT INTERNAL MAMMARY ARTERY TO LEFT ANTERIOR DESCENDING ARTERY (N/A) -  CV- s/p redo AVR, CABg In atrial fib- rate controlled on amiodarone drip Not on beta blocker due to 1st degree block Will need anticoagulation if a fib persists  NEURO=- confusion- hasn't slept in 2 nights- reorients easily- family in room  Minimize narcotics  RESP- IS  RENAL- stable  ENDO- change CBG to [facility] and HS     LOS: 3 days    Lacie Landry C 12/30/2013

## 2013-12-30 NOTE — Progress Notes (Signed)
Repeat CXR this afternoon showed an increase in the right pneumothorax  He is clinically stable  Confusion a little better  He needs a chest tube placed.  I discussed the indications, risks, and benefits with the patient and his wife. They both agree to proceed

## 2013-12-30 NOTE — Progress Notes (Signed)
Pts. Rhythm back to afib HR 110's-120's BP stable, complained of chest pain again and still with some confusion.  Given tramadol for pain instead of morphine questionable confusion from the dose. Dr. Roxan Hockey was made aware in the OR and with orders made. Started on amiodarone drip and was given 150mg . Bolus. Unable to void, bladder was scanned and shows >620ml. of urine. Foley inserted as oredred and pt. Felt better after foley was inserted and drained >559ml of urine. Will continue to monitor pt.

## 2013-12-30 NOTE — Procedures (Signed)
Sedated with 2 mg versed IV- good effect  Using sterile technique and local anesthesia with 15 ml of 1% lidocaine, 28 F CT placed anterior right chest. Small air leak present. CT to suction. No complications.

## 2013-12-31 ENCOUNTER — Inpatient Hospital Stay (HOSPITAL_COMMUNITY): Payer: Medicare Other

## 2013-12-31 LAB — COMPREHENSIVE METABOLIC PANEL
ALBUMIN: 3.3 g/dL — AB (ref 3.5–5.2)
ALT: 15 U/L (ref 0–53)
AST: 14 U/L (ref 0–37)
Alkaline Phosphatase: 66 U/L (ref 39–117)
Anion gap: 12 (ref 5–15)
BILIRUBIN TOTAL: 0.9 mg/dL (ref 0.3–1.2)
BUN: 17 mg/dL (ref 6–23)
CO2: 24 meq/L (ref 19–32)
CREATININE: 0.67 mg/dL (ref 0.50–1.35)
Calcium: 8.6 mg/dL (ref 8.4–10.5)
Chloride: 96 mEq/L (ref 96–112)
GFR calc non Af Amer: >90 mL/min (ref >90)
GLUCOSE: 102 mg/dL — AB (ref 70–99)
POTASSIUM: 4 meq/L (ref 3.7–5.3)
Sodium: 132 mEq/L — ABNORMAL LOW (ref 137–147)
Total Protein: 5.6 g/dL — ABNORMAL LOW (ref 6.0–8.3)

## 2013-12-31 LAB — GLUCOSE, CAPILLARY
Glucose-Capillary: 101 mg/dL — ABNORMAL HIGH (ref 70–99)
Glucose-Capillary: 97 mg/dL (ref 70–99)
Glucose-Capillary: 115 mg/dL — ABNORMAL HIGH (ref 70–99)
Glucose-Capillary: 107 mg/dL — ABNORMAL HIGH (ref 70–99)
Glucose-Capillary: 108 mg/dL — ABNORMAL HIGH (ref 70–99)

## 2013-12-31 LAB — TYPE AND SCREEN
ABO/RH(D): A POS
Antibody Screen: POSITIVE
UNIT DIVISION: 0
UNIT DIVISION: 0
Unit division: 0
Unit division: 0
Unit division: 0

## 2013-12-31 LAB — CBC
HEMATOCRIT: 27.3 % — AB (ref 39.0–52.0)
Hemoglobin: 9.5 g/dL — ABNORMAL LOW (ref 13.0–17.0)
MCH: 30.4 pg (ref 26.0–34.0)
MCHC: 34.8 g/dL (ref 30.0–36.0)
MCV: 87.2 fL (ref 78.0–100.0)
Platelets: 98 10*3/uL — ABNORMAL LOW (ref 150–400)
RBC: 3.13 MIL/uL — AB (ref 4.22–5.81)
RDW: 14 % (ref 11.5–15.5)
WBC: 10.5 10*3/uL (ref 4.0–10.5)

## 2013-12-31 MED ORDER — POTASSIUM CHLORIDE CRYS ER 20 MEQ PO TBCR
20.0000 meq | EXTENDED_RELEASE_TABLET | Freq: Once | ORAL | Status: AC
  Administered 2013-12-31: 20 meq via ORAL
  Filled 2013-12-31: qty 1

## 2013-12-31 MED ORDER — LISINOPRIL 2.5 MG PO TABS
2.5000 mg | ORAL_TABLET | Freq: Every day | ORAL | Status: DC
  Administered 2013-12-31 – 2014-01-01 (×2): 2.5 mg via ORAL
  Filled 2013-12-31 (×3): qty 1

## 2013-12-31 MED ORDER — AMIODARONE HCL 200 MG PO TABS
400.0000 mg | ORAL_TABLET | Freq: Two times a day (BID) | ORAL | Status: DC
  Administered 2013-12-31 – 2014-01-02 (×6): 400 mg via ORAL
  Filled 2013-12-31 (×8): qty 2

## 2013-12-31 MED ORDER — FOLIC ACID 1 MG PO TABS
1.0000 mg | ORAL_TABLET | Freq: Every day | ORAL | Status: DC
  Administered 2013-12-31 – 2014-01-03 (×4): 1 mg via ORAL
  Filled 2013-12-31 (×4): qty 1

## 2013-12-31 MED ORDER — FUROSEMIDE 40 MG PO TABS
40.0000 mg | ORAL_TABLET | Freq: Once | ORAL | Status: AC
  Administered 2013-12-31: 40 mg via ORAL
  Filled 2013-12-31: qty 1

## 2013-12-31 MED ORDER — FERROUS SULFATE 325 (65 FE) MG PO TABS
325.0000 mg | ORAL_TABLET | Freq: Every day | ORAL | Status: DC
  Administered 2014-01-01 – 2014-01-03 (×3): 325 mg via ORAL
  Filled 2013-12-31 (×5): qty 1

## 2013-12-31 NOTE — Progress Notes (Addendum)
[facility] DAILY ICU PROGRESS NOTE                   [address]            [place] [address]          [phone]   4 Days Post-Op Procedure(s) (LRB): REDO AORTIC VALVE REPLACEMENT WITH 23MM MAGNA EASE PERICARDIAL TISSUE VALVE (N/A) TRANSESOPHAGEAL ECHOCARDIOGRAM (TEE) (N/A) CORONARY ARTERY BYPASS GRAFTING TIMES TWO USING RIGHT LEG SAPHENOUS VEIN TO RIGHT CORONARY ARTERY AND LEFT INTERNAL MAMMARY ARTERY TO LEFT ANTERIOR DESCENDING ARTERY (N/A)  Total Length of Stay:  LOS: 4 days   Subjective: Alert and oriented with wife at bedside, but has had previous confusion  Objective: Vital signs in last 24 hours: Temp:  [97.5 F (36.4 C)-98.7 F (37.1 C)] 98.3 F (36.8 C) (12/20 1500) Pulse Rate:  [39-104] 71 (12/20 1500) Cardiac Rhythm:  [-] A-V Sequential paced (12/20 1200) Resp:  [9-26] 26 (12/20 1500) BP: (108-165)/(71-98) 136/87 mmHg (12/20 1500) SpO2:  [91 %-100 %] 99 % (12/20 1500) Weight:  [164 lb 14.5 oz (74.8 kg)] 164 lb 14.5 oz (74.8 kg) (12/20 0434)  Filed Weights   12/28/13 1428 12/29/13 0500 12/31/13 0434  Weight: 168 lb 14 oz (76.6 kg) 164 lb 14.5 oz (74.8 kg) 164 lb 14.5 oz (74.8 kg)    Weight change:       Intake/Output from previous day: 12/19 0701 - 12/20 0700 In: 1373.7 [P.O.:240; I.V.:1133.7] Out: 1575 [Urine:1555; Chest Tube:20]  Intake/Output this shift: Total I/O In: 660 [P.O.:480; I.V.:180] Out: 815 [Urine:635; Chest Tube:180]  Current Meds: Scheduled Meds: . acetaminophen  1,000 mg Oral 4 times per day   Or  . acetaminophen (TYLENOL) oral liquid 160 mg/5 mL  1,000 mg Per Tube 4 times per day  . amiodarone  400 mg Oral BID  . aspirin EC  81 mg Oral Daily   Or  . aspirin  81 mg Per Tube Daily  . bisacodyl  10 mg Oral Daily   Or  . bisacodyl  10 mg Rectal Daily  . brimonidine  1 drop Both Eyes TID  . docusate sodium  200 mg Oral Daily  . Influenza vac split quadrivalent PF  0.5 mL Intramuscular Tomorrow-1000  . insulin  aspart  0-15 Units Subcutaneous TID WC  . latanoprost  1 drop Both Eyes QHS  . pantoprazole  40 mg Oral Daily  . pneumococcal 23 valent vaccine  0.5 mL Intramuscular Tomorrow-1000  . sodium chloride  3 mL Intravenous Q12H   Continuous Infusions: . sodium chloride Stopped (12/28/13 1404)  . sodium chloride    . sodium chloride 20 mL/hr (12/31/13 1200)  . lactated ringers Stopped (12/31/13 1200)  . nitroGLYCERIN Stopped (12/27/13 1746)  . phenylephrine (NEO-SYNEPHRINE) Adult infusion Stopped (12/28/13 1442)   PRN Meds:.metoprolol, morphine injection, ondansetron (ZOFRAN) IV, sodium chloride, traMADol, zolpidem  General appearance: alert, cooperative and no distress Neurologic: intact Heart: Paced, no murmur Lungs: clear to auscultation bilaterally Abdomen: soft, non-tender; bowel sounds normal; no masses,  no organomegaly Extremities: Trace LE edema. Ecchymosis right thigh Wound: Sternal wound is clean and dry  Lab Results: CBC: Recent Labs  12/30/13 0430 12/31/13 0439  WBC 10.1 10.5  HGB 8.8* 9.5*  HCT 25.7* 27.3*  PLT 64* 98*   BMET:  Recent Labs  12/30/13 0430 12/31/13 0439  NA 133* 132*  K 4.2 4.0  CL 98 96  CO2 24 24  GLUCOSE 115* 102*  BUN  26* 17  CREATININE 0.72 0.67  CALCIUM 8.4 8.6    PT/INR: No results for input(s): LABPROT, INR in the last 72 hours. Radiology: Dg Chest 1v Repeat Same Day  12/30/2013   CLINICAL DATA:  Post chest tube placement.  EXAM: CHEST - 1 VIEW SAME DAY  COMPARISON:  Portable film earlier in the day  FINDINGS: RIGHT chest tube has been placed with its tip in the lung apex. Previously observed RIGHT pneumothorax is no longer seen. Swan-Ganz introducer remains in good position. Moderate RIGHT effusion. Cardiomegaly.  LEFT pneumothorax noted on the earlier film is difficult to observe on this radiograph but is probably still present as a small apical air collection as suggested from a medial arrow which has been annotated on the image.   IMPRESSION: Satisfactory RIGHT chest tube placement. RIGHT pneumothorax is no longer visible.  Tiny LEFT pneumothorax appears improved as well.   Electronically Signed   By: Rolla Flatten M.D.   On: 12/30/2013 20:32   Dg Chest 1v Repeat Same Day  12/30/2013   CLINICAL DATA:  Pneumothorax  EXAM: CHEST - 1 VIEW SAME DAY  COMPARISON:  12/30/2013 at 0506 hrs  FINDINGS: Moderate right apical pneumothorax, increased.  Small left apical pneumothorax, unchanged.  Small bilateral pleural effusions.  Cardiomegaly.  Postsurgical changes related to prior CABG.  Right IJ venous sheath.  IMPRESSION: Moderate right apical pneumothorax, increased.  Small left apical pneumothorax, unchanged.  These results will be called to the ordering clinician or representative by the Radiologist Assistant, and communication documented in the PACS or zVision Dashboard.   Electronically Signed   By: Julian Hy M.D.   On: 12/30/2013 17:13   Dg Chest Port 1 View  12/31/2013   CLINICAL DATA:  Pneumothorax  EXAM: PORTABLE CHEST - 1 VIEW  COMPARISON:  12/30/2013, 12/27/2013  FINDINGS: Right-sided chest to with the tip directed towards the apex. No right pneumothorax. Small bilateral layering pleural effusions. No focal consolidation. No definite left pneumothorax. Stable cardiomegaly. Prior CABG. Right jugular sheath in unchanged satisfactory position. No acute osseous abnormality.  IMPRESSION: 1. Right-sided chest tube with the tip directed towards the apex without a pneumothorax. 2. No definite left pneumothorax. 3. Bilateral small layering pleural effusions.   Electronically Signed   By: Kathreen Devoid   On: 12/31/2013 08:27   Dg Chest Port 1 View  12/30/2013   ADDENDUM REPORT: 12/30/2013 07:24  ADDENDUM: Critical Value/emergent results were called by telephone at the time of interpretation on 12/30/2013 at 7:24 am to Dr. Roxan Hockey, who verbally acknowledged these results.   Electronically Signed   By: Lowella Grip M.D.   On:  12/30/2013 07:24   12/30/2013   CLINICAL DATA:  Status post aortic valve replacement  EXAM: PORTABLE CHEST - 1 VIEW  COMPARISON:  December 29, 2013  FINDINGS: There is a pneumothorax on the right in the apical and right lateral regions which was not appreciable 1 day prior. There is no appreciable tension component. There is a small left apical pneumothorax which is stable.  Central catheter tip is in the superior vena cava.  There is left lower lobe consolidation, increased. There is milder consolidation medial right base. Heart is enlarged with pulmonary vascularity within normal limits. There is a rather minimal left effusion.  IMPRESSION: New pneumothorax on the right. Small left apical pneumothorax, stable. Increased left lower lobe consolidation. Right base consolidation medially appears stable. Small left effusion.  Electronically Signed: By: Lowella Grip M.D. On: 12/30/2013 07:13  Assessment/Plan: S/P Procedure(s) (LRB): REDO AORTIC VALVE REPLACEMENT WITH 23MM MAGNA EASE PERICARDIAL TISSUE VALVE (N/A) TRANSESOPHAGEAL ECHOCARDIOGRAM (TEE) (N/A) CORONARY ARTERY BYPASS GRAFTING TIMES TWO USING RIGHT LEG SAPHENOUS VEIN TO RIGHT CORONARY ARTERY AND LEFT INTERNAL MAMMARY ARTERY TO LEFT ANTERIOR DESCENDING ARTERY (N/A)  1. CV- AV paced. Previous a fib. On Amiodarone 400 bid. Not on BB. Will re start low dose Lisinopril for BP control. 2. Pulmonary-Right chest tube placed yesterday for ptx. Chest tube is to water seal. There is no air leak.CXR today shows no pneumothorax, small bilateral pleural effusions. Hope to remove in am.Encourage incentive spirometer 3. Volume Overload-give lasix 40 now 4. ABL anemia-H and H stable at 9.5 and 27.3. Start folic acid and ferrous sulfate 5. Mild thrombocytopenia-platelets up to 98,000    Esbeidy Mclaine M PA-C 12/31/2013 5:08 PM

## 2014-01-01 ENCOUNTER — Inpatient Hospital Stay (HOSPITAL_COMMUNITY): Payer: Medicare Other

## 2014-01-01 LAB — TYPE AND SCREEN
ABO/RH(D): A POS
Antibody Screen: POSITIVE
DAT, IgG: NEGATIVE
Unit division: 0
Unit division: 0
Unit division: 0
Unit division: 0

## 2014-01-01 LAB — BASIC METABOLIC PANEL
Anion gap: 13 (ref 5–15)
BUN: 16 mg/dL (ref 6–23)
CHLORIDE: 101 meq/L (ref 96–112)
CO2: 23 meq/L (ref 19–32)
Calcium: 8.4 mg/dL (ref 8.4–10.5)
Creatinine, Ser: 0.7 mg/dL (ref 0.50–1.35)
GFR calc Af Amer: >90 mL/min (ref >90)
GFR calc non Af Amer: >90 mL/min (ref >90)
GLUCOSE: 94 mg/dL (ref 70–99)
Potassium: 3.6 mEq/L — ABNORMAL LOW (ref 3.7–5.3)
SODIUM: 137 meq/L (ref 137–147)

## 2014-01-01 LAB — GLUCOSE, CAPILLARY
Glucose-Capillary: 88 mg/dL (ref 70–99)
Glucose-Capillary: 88 mg/dL (ref 70–99)
Glucose-Capillary: 101 mg/dL — ABNORMAL HIGH (ref 70–99)
Glucose-Capillary: 116 mg/dL — ABNORMAL HIGH (ref 70–99)

## 2014-01-01 LAB — CBC
HCT: 26.6 % — ABNORMAL LOW (ref 39.0–52.0)
HEMOGLOBIN: 8.9 g/dL — AB (ref 13.0–17.0)
MCH: 28.4 pg (ref 26.0–34.0)
MCHC: 33.5 g/dL (ref 30.0–36.0)
MCV: 85 fL (ref 78.0–100.0)
Platelets: 116 10*3/uL — ABNORMAL LOW (ref 150–400)
RBC: 3.13 MIL/uL — AB (ref 4.22–5.81)
RDW: 13.9 % (ref 11.5–15.5)
WBC: 7.5 10*3/uL (ref 4.0–10.5)

## 2014-01-01 MED ORDER — TRAMADOL HCL 50 MG PO TABS: 50.0000 mg | ORAL_TABLET | ORAL | Status: DC | PRN

## 2014-01-01 MED ORDER — SODIUM CHLORIDE 0.9 % IV SOLN: 250.0000 mL | INTRAVENOUS | Status: DC | PRN

## 2014-01-01 MED ORDER — TAMSULOSIN HCL 0.4 MG PO CAPS
0.4000 mg | ORAL_CAPSULE | Freq: Every day | ORAL | Status: DC
  Administered 2014-01-01 – 2014-01-03 (×3): 0.4 mg via ORAL
  Filled 2014-01-01 (×3): qty 1

## 2014-01-01 MED ORDER — SODIUM CHLORIDE 0.9 % IJ SOLN
3.0000 mL | Freq: Two times a day (BID) | INTRAMUSCULAR | Status: DC
  Administered 2014-01-01 – 2014-01-02 (×3): 3 mL via INTRAVENOUS

## 2014-01-01 MED ORDER — DOCUSATE SODIUM 100 MG PO CAPS
200.0000 mg | ORAL_CAPSULE | Freq: Every day | ORAL | Status: DC
  Administered 2014-01-01 – 2014-01-03 (×3): 200 mg via ORAL
  Filled 2014-01-01 (×3): qty 2

## 2014-01-01 MED ORDER — SODIUM CHLORIDE 0.9 % IJ SOLN: 3.0000 mL | INTRAMUSCULAR | Status: DC | PRN

## 2014-01-01 MED ORDER — BISACODYL 5 MG PO TBEC: 10.0000 mg | DELAYED_RELEASE_TABLET | Freq: Every day | ORAL | Status: DC | PRN

## 2014-01-01 MED ORDER — POTASSIUM CHLORIDE CRYS ER 20 MEQ PO TBCR
20.0000 meq | EXTENDED_RELEASE_TABLET | Freq: Every day | ORAL | Status: AC
  Administered 2014-01-01 – 2014-01-03 (×3): 20 meq via ORAL
  Filled 2014-01-01 (×3): qty 1

## 2014-01-01 MED ORDER — ASPIRIN EC 325 MG PO TBEC
325.0000 mg | DELAYED_RELEASE_TABLET | Freq: Every day | ORAL | Status: DC
  Administered 2014-01-01 – 2014-01-03 (×3): 325 mg via ORAL
  Filled 2014-01-01 (×3): qty 1

## 2014-01-01 MED ORDER — MOVING RIGHT ALONG BOOK
Freq: Once | Status: AC
  Administered 2014-01-01: 1
  Filled 2014-01-01: qty 1

## 2014-01-01 MED ORDER — INSULIN ASPART 100 UNIT/ML ~~LOC~~ SOLN: 0.0000 [IU] | Freq: Three times a day (TID) | SUBCUTANEOUS | Status: DC

## 2014-01-01 MED ORDER — FUROSEMIDE 40 MG PO TABS
40.0000 mg | ORAL_TABLET | Freq: Every day | ORAL | Status: AC
  Administered 2014-01-01 – 2014-01-03 (×3): 40 mg via ORAL
  Filled 2014-01-01 (×3): qty 1

## 2014-01-01 MED ORDER — POTASSIUM CHLORIDE 10 MEQ/50ML IV SOLN
INTRAVENOUS | Status: AC
  Filled 2014-01-01: qty 50

## 2014-01-01 MED ORDER — POTASSIUM CHLORIDE 10 MEQ/50ML IV SOLN
10.0000 meq | INTRAVENOUS | Status: DC | PRN
  Administered 2014-01-01 (×2): 10 meq via INTRAVENOUS
  Filled 2014-01-01: qty 50

## 2014-01-01 MED ORDER — ATORVASTATIN CALCIUM 20 MG PO TABS
20.0000 mg | ORAL_TABLET | Freq: Every day | ORAL | Status: DC
  Administered 2014-01-01 – 2014-01-02 (×2): 20 mg via ORAL
  Filled 2014-01-01 (×4): qty 1

## 2014-01-01 MED ORDER — BISACODYL 10 MG RE SUPP: 10.0000 mg | Freq: Every day | RECTAL | Status: DC | PRN

## 2014-01-01 MED ORDER — PANTOPRAZOLE SODIUM 40 MG PO TBEC
40.0000 mg | DELAYED_RELEASE_TABLET | Freq: Every day | ORAL | Status: DC
  Administered 2014-01-01 – 2014-01-03 (×3): 40 mg via ORAL
  Filled 2014-01-01 (×3): qty 1

## 2014-01-01 NOTE — Progress Notes (Signed)
Bladder scan showed >1000; notified PA; PA gave verbal order to in and out patient-flomax ordered; if patient is unable to void in 6 hours place foley.  Rowe Pavy, RN

## 2014-01-01 NOTE — Evaluation (Signed)
Physical Therapy Evaluation Patient Details Name: [name] [name] MRN: [id number] DOB: Mar 22, 1940 Today's Date: 01/01/2014   History of Present Illness  Pt admitted for redo AVR with CABGx 2  Clinical Impression  Pt very pleasant with wife present throughout eval. Pt educated for sternal precautions, transfers, gait and plan. Pt will benefit from acute therapy to maximize mobility, adherence to precautions, gait and function to decrease burden of care.     Follow Up Recommendations Home health PT;Supervision for mobility/OOB    Equipment Recommendations  Rolling walker with 5" wheels;3in1 (PT)    Recommendations for Other Services       Precautions / Restrictions Precautions Precautions: Sternal      Mobility  Bed Mobility Overal bed mobility: Needs Assistance Bed Mobility: Rolling;Sidelying to Sit Rolling: Supervision Sidelying to sit: Supervision       General bed mobility comments: cues for sequence and precautions, min assist to scoot to EOB with cues for reciprocal scooting  Transfers Overall transfer level: Needs assistance   Transfers: Sit to/from Stand Sit to Stand: Min guard         General transfer comment: cues for hand placement and safety  Ambulation/Gait Ambulation/Gait assistance: Min guard Ambulation Distance (Feet): 350 Feet Assistive device: Rolling walker (2 wheeled) Gait Pattern/deviations: Step-through pattern;Decreased stride length;Trunk flexed;Narrow base of support   Gait velocity interpretation: Below normal speed for age/gender General Gait Details: cues for posture, looking up and stepping into RW  Stairs            Wheelchair Mobility    Modified Rankin (Stroke Patients Only)       Balance                                             Pertinent Vitals/Pain Pain Assessment: No/denies pain    Home Living Family/patient expects to be discharged to:: Private residence Living Arrangements:  Spouse/significant other Available Help at Discharge: Family;Available 24 hours/day Type of Home: House Home Access: Stairs to enter Entrance Stairs-Rails: None Entrance Stairs-Number of Steps: 3 Home Layout: Two level;Bed/bath upstairs;Able to live on main level with bedroom/bathroom Home Equipment: None      Prior Function Level of Independence: Independent               Hand Dominance        Extremity/Trunk Assessment   Upper Extremity Assessment: Overall WFL for tasks assessed           Lower Extremity Assessment: Overall WFL for tasks assessed      Cervical / Trunk Assessment: Kyphotic  Communication   Communication: HOH  Cognition Arousal/Alertness: Awake/alert Behavior During Therapy: WFL for tasks assessed/performed Overall Cognitive Status: Within Functional Limits for tasks assessed       Memory: Decreased recall of precautions              General Comments      Exercises        Assessment/Plan    PT Assessment Patient needs continued PT services  PT Diagnosis Difficulty walking   PT Problem List Decreased activity tolerance;Decreased balance;Decreased mobility;Decreased knowledge of use of DME;Decreased knowledge of precautions  PT Treatment Interventions Gait training;Stair training;Functional mobility training;Therapeutic activities;Therapeutic exercise;Patient/family education;DME instruction   PT Goals (Current goals can be found in the Care Plan section) Acute Rehab PT Goals 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 PT Goal Formulation:  With patient/family Time For Goal Achievement: 01/15/14 Potential to Achieve Goals: Good    Frequency Min 3X/week   Barriers to discharge Decreased caregiver support      Co-evaluation               End of Session Equipment Utilized During Treatment: Gait belt Activity Tolerance: Patient tolerated treatment well Patient left: in chair;with call bell/phone within reach;with  family/visitor present Nurse Communication: Mobility status;Precautions         Time: 7169-6789 PT Time Calculation (min) (ACUTE ONLY): 22 min   Charges:   PT Evaluation $Initial PT Evaluation Tier I: 1 Procedure PT Treatments $Therapeutic Activity: 8-22 mins   PT G CodesMelford Aase 01/01/2014, 12:44 PM Elwyn Reach, [place]

## 2014-01-01 NOTE — Progress Notes (Signed)
Pt has walked x2 today. Asleep now. RN sts she can walk with him later tonight. Yves Dill CES, ACSM 2:36 PM 01/01/2014

## 2014-01-01 NOTE — Progress Notes (Addendum)
Pt only voided 135ml. Pt bladder scanned at 2245, pt had 992 inside bladder. Foley placed per PA previous verbal order

## 2014-01-01 NOTE — Op Note (Signed)
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  SURGICAL PROCEDURE:  Redo aortic valve replacement with a #23 [place] pericardial tissue valve, model 3300TFX, serial [id number] and coronary artery bypass grafting x2 with the left internal mammary to the left anterior descending coronary artery and reverse saphenous vein graft to the distal right coronary artery with right thigh EndoVein harvesting.  SURGEON:  Lanelle Bal, MD  FIRST ASSISTANT:  John Giovanni, PA-C  BRIEF HISTORY:  The patient is a 73 year old male, who 10 years previously had undergone aortic valve replacement with a pericardial tissue valve 25 mm and at that time, had no significant coronary occlusive disease.  The patient had done well for the past 10 years until the last several months.  He had increasing symptoms of congestive heart failure with dyspnea on exertion and near-syncope.  Echocardiogram revealed at least moderate aortic insufficiency and significant aortic stenosis and a dilated ventricle with depressed LV function.  Cardiac catheterization was performed by Dr. Martinique which demonstrated 70-80% proximal LAD lesion and 70% right lesion.  The circumflex was intact with luminal irregularities, but no high-grade stenosis.  The CT scan of the chest was performed to evaluate aortic size.  The aorta was less than 4 cm in size and did not warrant replacement.   Risks and options were discussed with the patient for a surgical intervention.  Because of his age of 73 years and history of undiagnosed anemia, it was felt that tissue valve was most appropriate and the patient agreed and signed informed consent.  DESCRIPTION OF PROCEDURE:  With Swan-Ganz and arterial line monitors in place, the patient underwent general endotracheal anesthesia without incident.  A TEE probe was placed by Dr. Tamala Julian and examined by both Dr. Tamala Julian and Dr. Glennon Mac confirming severe aortic stenosis at least moderate aortic insufficiency, dilated ventricle with depressed LV function.  The mitral valve had some calcification in the annulus, but without evidence of stenosis or regurgitation.  After appropriate time- out was performed, the chest and legs were prepped with Betadine and draped in usual sterile manner.  Using the [employer] EndoVein harvesting system, a segment of vein was harvested from the right thigh and was of adequate quality and caliber.  Median sternotomy was performed with a sagittal saw, taking care not to injure any underlying structures. Sternum was separated after removing the previous sternal wires.  The left internal mammary artery was dissected down as a pedicle graft.  The distal artery was divided, had good free flow.  The vessel was hydrostatically dilated with heparinized saline.  We then turned our attention to dissecting out the ascending aorta in the right atrium and  the left ventricle.  This was somewhat tedious, but when completed we were able to heparinize the patient, cannulated the ascending aorta.  I placed a retrograde cardioplegia catheter into the right atrium.  A dual stage venous cannula was also placed.  The patient was then placed on cardiopulmonary bypass 2.4 L/min/m2.  A right superior pulmonary vein vent was placed.  Aortic root vent cardioplegia needle was introduced into the ascending aorta.  The patient's body temperature  cooled to 30 degrees.  Aortic crossclamp was applied and while the heart was beating, cold blood cardioplegia was administered to antegrade with fibrillation. We then switched to administration of retrograde plegia a total of 900 mL.  Myocardial septal temperature was monitored throughout the crossclamp.  Attention was turned first to the coronary disease.  The distal right coronary artery was of good size at least 1 to 1.8 mm in size.  The vessel was thin-walled, was opened, admitted with 1.5 mm probe easily.  Using running 7-0 Prolene, distal anastomosis was performed.  Additional cold blood cardioplegia was administered down the vein graft.  The left internal mammary artery was then prepared and after opening the LAD in the mid to distal LAD, a running 8-0 Prolene was used to anastomosis the left anterior descending to the left anterior descending.  The left internal mammary to the left anterior descending coronary artery.  Fascia of the mammary was tacked to the epicardium.  We then took attention to the aortic valve.  The previous closure of the aorta was intact and we reopened the aorta through the old aortotomy.  This gave good exposure of the pericardial tissue valve that was well incorporated.  There was no perivalvular leakage and the valve was seated well.  The noncoronary cusp of the valve had deteriorated to commissural points making the valve remain horizontal. It was calcified enough so it would not open and there was leakage both at the commissural points and because of poor coaptation, the other leaflets were also moderately calcified.  With care, the valve was then excised and removed.  A 23 pericardial tissue valve was then selected. A #2 Tycron pledgeted sutures were placed circumferentially with the pledgets on the ventricular surface.  These were used to seat the valve and secured in place and seated well.  Intermittently, a retrograde cardioplegia was administered  as was cardioplegia down the right vein graft.  With the valve in place,  there was no impingement on either of the right or left coronary ostium.  Care was taken to remove all loose calcific debris and remnants of the previous valve.  The aortotomy was then closed with a running 3-0 Prolene.  The heart was allowed to passively fill and de-air.  At the site of the previous stent, punch aortotomy was performed and the vein graft to the right coronary artery was anastomosed to the ascending aorta.  Air was allowed to evacuate through the aortotomy and the proximal anastomosis. These were then completed and secured.  The bulldog on the mammary artery was removed with prompt rise in myocardial septal temperature.  Aortic crossclamp was removed with total cross-clamp time of 132 minutes.  The patient's body temperature was rewarmed to 37 degrees.  Atrial and ventricular pacing wires were applied.  He spontaneously converted to a sinus rhythm with a first-degree AV block.  He was then AV paced to increase rate. Low-dose milrinone and dopamine were started.  With the patient's body temperature rewarmed, he was then ventilated and weaned  from cardiopulmonary bypass without difficulty.  He remained hemodynamically stable.  He was decannulated in usual fashion.  The TEE showed good valve function and overall improved left ventricular function.  Total pump time was 185 minutes, although the patient had low blood count prior to surgery after CellSaver.  His post pump hematocrit did not require any blood transfusion during the operative procedure.  After protamine was administered with hemostatic operative field, the left pleural tube and a Blake mediastinal drain were left in place. Pericardium and pleura were closed over the ascending aorta and right ventricle.  The sternum was then closed with #6 stainless steel wire. Fascia was closed with interrupted 0 Vicryl, running 3-0 Vicryl subcutaneous  tissue, 4-0 subcuticular stitch in skin edges.  Dry dressings were applied.  Sponge and needle count was reported as correct.  At the completion of the procedure, the patient tolerated procedure without obvious complication and was transferred to the Surgical Intensive Care Unit for further postoperative care.  Total pump time 185 minutes.    Lanelle Bal, MD    EG/MEDQ  D:  01/01/2014  T:  01/01/2014  Job:  [id number]

## 2014-01-01 NOTE — Progress Notes (Signed)
Patient ID: [name] [name], male   DOB: 01-01-1941, 73 y.o.   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  Objective: Vital signs in last 24 hours: Temp:  [97.3 F (36.3 C)-99.1 F (37.3 C)] 99.1 F (37.3 C) (12/21 0722) Pulse Rate:  [39-78] 78 (12/21 0700) Cardiac Rhythm:  [-] A-V Sequential paced (12/21 0600) Resp:  [14-26] 21 (12/21 0700) BP: (111-148)/(72-94) 126/93 mmHg (12/21 0600) SpO2:  [91 %-100 %] 98 % (12/21 0700) Weight:  [160 lb 11.5 oz (72.9 kg)] 160 lb 11.5 oz (72.9 kg) (12/21 0500)  Filed Weights   12/29/13 0500 12/31/13 0434 01/01/14 0500  Weight: 164 lb 14.5 oz (74.8 kg) 164 lb 14.5 oz (74.8 kg) 160 lb 11.5 oz (72.9 kg)    Weight change: -4 lb 3 oz (-1.9 kg)   Hemodynamic parameters for last 24 hours:    Intake/Output from previous day: 12/20 0701 - 12/21 0700 In: 7858 [P.O.:960; I.V.:480; IV Piggyback:50] Out: 2330 [Urine:2130; Chest Tube:200]  Intake/Output this shift:    Current Meds: Scheduled Meds: . acetaminophen  1,000 mg Oral 4 times per day   Or  . acetaminophen (TYLENOL) oral liquid 160 mg/5 mL  1,000 mg Per Tube 4 times per day  . amiodarone  400 mg Oral BID  . aspirin EC  81 mg Oral Daily   Or  . aspirin  81 mg Per Tube Daily  . bisacodyl  10 mg Oral Daily   Or  . bisacodyl  10 mg Rectal Daily  . brimonidine  1 drop Both Eyes TID  . docusate sodium  200 mg Oral Daily  . ferrous sulfate   325 mg Oral Q breakfast  . folic acid  1 mg Oral Daily  . Influenza vac split quadrivalent PF  0.5 mL Intramuscular Tomorrow-1000  . insulin aspart  0-15 Units Subcutaneous TID WC  . latanoprost  1 drop Both Eyes QHS  . lisinopril  2.5 mg Oral Daily  . pantoprazole  40 mg Oral Daily  . pneumococcal 23 valent vaccine  0.5 mL Intramuscular Tomorrow-1000  . sodium chloride  3 mL Intravenous Q12H   Continuous Infusions: . sodium chloride Stopped (12/28/13 1404)  . sodium chloride    . sodium chloride 20 mL/hr (12/31/13 1200)  . lactated ringers Stopped (12/31/13 1200)  . nitroGLYCERIN Stopped (12/27/13 1746)  . phenylephrine (NEO-SYNEPHRINE) Adult infusion Stopped (12/28/13 1442)   PRN Meds:.metoprolol, morphine injection, ondansetron (ZOFRAN) IV, potassium chloride, sodium chloride, traMADol, zolpidem  General appearance: alert, cooperative and no distress Neurologic: intact Heart: regular rate and rhythm, S1, S2 normal, no murmur, click, rub or gallop Lungs: diminished breath sounds bibasilar Abdomen: soft, non-tender; bowel sounds normal; no masses,  no organomegaly Extremities: extremities normal, atraumatic, no cyanosis or edema and Homans sign is negative, no sign  of DVT Wound: sternum stable  Lab Results: CBC: Recent Labs  12/31/13 0439 01/01/14 0500  WBC 10.5 7.5  HGB 9.5* 8.9*  HCT 27.3* 26.6*  PLT 98* 116*   BMET:  Recent Labs  12/31/13 0439 01/01/14 0500  NA 132* 137  K 4.0 3.6*  CL 96 101  CO2 24 23  GLUCOSE 102* 94  BUN 17 16  CREATININE 0.67 0.70  CALCIUM 8.6 8.4    PT/INR: No results for input(s): LABPROT, INR in the last 72 hours. Radiology: Dg Chest 1v Repeat Same Day  12/30/2013   CLINICAL DATA:  Post chest tube placement.  EXAM: CHEST - 1 VIEW SAME DAY  COMPARISON:  Portable film earlier in the day  FINDINGS: RIGHT chest tube has been placed with its tip in the lung apex. Previously observed RIGHT pneumothorax is no longer seen. Swan-Ganz  introducer remains in good position. Moderate RIGHT effusion. Cardiomegaly.  LEFT pneumothorax noted on the earlier film is difficult to observe on this radiograph but is probably still present as a small apical air collection as suggested from a medial arrow which has been annotated on the image.  IMPRESSION: Satisfactory RIGHT chest tube placement. RIGHT pneumothorax is no longer visible.  Tiny LEFT pneumothorax appears improved as well.   Electronically Signed   By: Rolla Flatten M.D.   On: 12/30/2013 20:32   Dg Chest 1v Repeat Same Day  12/30/2013   CLINICAL DATA:  Pneumothorax  EXAM: CHEST - 1 VIEW SAME DAY  COMPARISON:  12/30/2013 at 0506 hrs  FINDINGS: Moderate right apical pneumothorax, increased.  Small left apical pneumothorax, unchanged.  Small bilateral pleural effusions.  Cardiomegaly.  Postsurgical changes related to prior CABG.  Right IJ venous sheath.  IMPRESSION: Moderate right apical pneumothorax, increased.  Small left apical pneumothorax, unchanged.  These results will be called to the ordering clinician or representative by the Radiologist Assistant, and communication documented in the PACS or zVision Dashboard.   Electronically Signed   By: Julian Hy M.D.   On: 12/30/2013 17:13   Dg Chest Port 1 View  12/31/2013   CLINICAL DATA:  Pneumothorax  EXAM: PORTABLE CHEST - 1 VIEW  COMPARISON:  12/30/2013, 12/27/2013  FINDINGS: Right-sided chest to with the tip directed towards the apex. No right pneumothorax. Small bilateral layering pleural effusions. No focal consolidation. No definite left pneumothorax. Stable cardiomegaly. Prior CABG. Right jugular sheath in unchanged satisfactory position. No acute osseous abnormality.  IMPRESSION: 1. Right-sided chest tube with the tip directed towards the apex without a pneumothorax. 2. No definite left pneumothorax. 3. Bilateral small layering pleural effusions.   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

## 2014-01-02 ENCOUNTER — Inpatient Hospital Stay (HOSPITAL_COMMUNITY): Payer: Medicare Other

## 2014-01-02 LAB — CBC
HCT: 26.1 % — ABNORMAL LOW (ref 39.0–52.0)
Hemoglobin: 8.7 g/dL — ABNORMAL LOW (ref 13.0–17.0)
MCH: 28.4 pg (ref 26.0–34.0)
MCHC: 33.3 g/dL (ref 30.0–36.0)
MCV: 85.3 fL (ref 78.0–100.0)
Platelets: 131 10*3/uL — ABNORMAL LOW (ref 150–400)
RBC: 3.06 MIL/uL — ABNORMAL LOW (ref 4.22–5.81)
RDW: 13.9 % (ref 11.5–15.5)
WBC: 6.8 10*3/uL (ref 4.0–10.5)

## 2014-01-02 LAB — BASIC METABOLIC PANEL
Anion gap: 4 — ABNORMAL LOW (ref 5–15)
BUN: 15 mg/dL (ref 6–23)
CO2: 24 mmol/L (ref 19–32)
Calcium: 7.8 mg/dL — ABNORMAL LOW (ref 8.4–10.5)
Chloride: 106 mEq/L (ref 96–112)
Creatinine, Ser: 0.83 mg/dL (ref 0.50–1.35)
GFR calc Af Amer: >90 mL/min (ref >90)
GFR calc non Af Amer: 85 mL/min — ABNORMAL LOW (ref >90)
Glucose, Bld: 110 mg/dL — ABNORMAL HIGH (ref 70–99)
Potassium: 3.6 mmol/L (ref 3.5–5.1)
Sodium: 134 mmol/L — ABNORMAL LOW (ref 135–145)

## 2014-01-02 LAB — GLUCOSE, CAPILLARY: Glucose-Capillary: 105 mg/dL — ABNORMAL HIGH (ref 70–99)

## 2014-01-02 MED ORDER — POTASSIUM CHLORIDE CRYS ER 20 MEQ PO TBCR
40.0000 meq | EXTENDED_RELEASE_TABLET | Freq: Once | ORAL | Status: AC
  Administered 2014-01-02: 40 meq via ORAL
  Filled 2014-01-02: qty 2

## 2014-01-02 MED ORDER — LISINOPRIL 5 MG PO TABS
5.0000 mg | ORAL_TABLET | Freq: Every day | ORAL | Status: DC
  Administered 2014-01-02 – 2014-01-03 (×2): 5 mg via ORAL
  Filled 2014-01-02 (×2): qty 1

## 2014-01-02 MED ORDER — LACTULOSE 10 GM/15ML PO SOLN
20.0000 g | Freq: Once | ORAL | Status: AC
Start: 2014-01-02 — End: 2014-01-02
  Administered 2014-01-02: 20 g via ORAL
  Filled 2014-01-02: qty 30

## 2014-01-02 NOTE — Progress Notes (Signed)
CARDIAC REHAB PHASE I   PRE:  Rate/Rhythm: 70 SR PACs  BP:  Supine: 110/70  Sitting:   Standing:    SaO2: 97%RA  MODE:  Ambulation: 350 ft   POST:  Rate/Rhythm: 77SR PACs, PVCs  BP:  Supine:   Sitting: 130/80  Standing:    SaO2: 98%RA 1400-1432 Assisted pt to bathroom and helped him clean up. Pt then walked 350 ft on RA with rolling walker and asst x 1. Pt encouraged not to get up by himself. Pt does not have walker at home so recommend rolling walker for home use. Discussed with case Freight forwarder. Told wife we will educate tomorrow if he is for discharge. To recliner with wife in room.   Graylon Good, RN BSN  01/02/2014 2:30 PM

## 2014-01-02 NOTE — Progress Notes (Signed)
Patient ambulated in hallway 1 assist front wheel rolling walker 150 ft; patient back in chair; chair alarm on; call bell within reach; will continue to monitor.  Rowe Pavy, RN

## 2014-01-02 NOTE — Progress Notes (Addendum)
      [address]       [name] [address]             [phone]        6 Days Post-Op Procedure(s) (LRB): REDO AORTIC VALVE REPLACEMENT WITH 23MM MAGNA EASE PERICARDIAL TISSUE VALVE (N/A) TRANSESOPHAGEAL ECHOCARDIOGRAM (TEE) (N/A) CORONARY ARTERY BYPASS GRAFTING TIMES TWO USING RIGHT LEG SAPHENOUS VEIN TO RIGHT CORONARY ARTERY AND LEFT INTERNAL MAMMARY ARTERY TO LEFT ANTERIOR DESCENDING ARTERY (N/A)  Subjective: Patient already walked with PT this am. No specific complaints, except for constipation.  Objective: Vital signs in last 24 hours: Temp:  [97.7 F (36.5 C)-99.1 F (37.3 C)] 99.1 F (37.3 C) (12/22 0418) Pulse Rate:  [44-91] 70 (12/22 0418) Cardiac Rhythm:  [-] Normal sinus rhythm (12/21 2015) Resp:  [14-19] 18 (12/22 0418) BP: (112-133)/(64-87) 129/77 mmHg (12/22 0418) SpO2:  [95 %-100 %] 96 % (12/22 0418) Weight:  [161 lb 12.8 oz (73.392 kg)] 161 lb 12.8 oz (73.392 kg) (12/22 0422)  Pre op weight 72 kg Current Weight  01/02/14 161 lb 12.8 oz (73.392 kg)      Intake/Output from previous day: 12/21 0701 - 12/22 0700 In: 720 [P.O.:720] Out: 1200 [Urine:1200]   Physical Exam:  Cardiovascular: RRR, no murmurs Pulmonary: Slightly diminished at bases; no rales, wheezes, or rhonchi. Abdomen: Soft, non tender, bowel sounds present. Extremities: No lower extremity edema. Wounds: Clean and dry.  No erythema or signs of infection.  Lab Results: CBC: Recent Labs  01/01/14 0500 01/02/14 0544  WBC 7.5 6.8  HGB 8.9* 8.7*  HCT 26.6* 26.1*  PLT 116* 131*   BMET:  Recent Labs  01/01/14 0500 01/02/14 0544  NA 137 134*  K 3.6* 3.6  CL 101 106  CO2 23 24  GLUCOSE 94 110*  BUN 16 15  CREATININE 0.70 0.83  CALCIUM 8.4 7.8*    PT/INR:  Lab Results  Component Value Date   INR 1.02 12/26/2013   INR 1.0 12/14/2013   ABG:  INR: Will add last result for INR, ABG once components are confirmed Will add last 4 CBG results once  components are confirmed  Assessment/Plan:  1. CV - Maintaining SR in the 70's. On Amiodarone 400 bid and Lisinopril 2.5 daily. Will increase Lisinopril to 5 daily for better BP control. 2.  Pulmonary - On room air. CXR appears to show minor subcutaneous emphysema right lateral chest wall, bibasilar atelectasis L>R, small bilateral pleural effusions.Encourage incentive spirometer 3. Volume Overload - On lasix 40 daily 4.  Acute blood loss anemia - H and H stable at 8.7 and 26.1. On Ferrous sulfate and folic acid. 5. Urinary retention-required foley re sinsertion last evening. Continue Flomax and continue foley for today. 6. Thrombocytopenia-platelets up to 131,000 7. Supplement potassium 8. CBGs 101/116/105. Pre op HGA1C 5.4. Stop accu checks and SS PRN 9. Remove EPW 10. LOC constipation 11. Will discuss discharge disposition with surgeon. May have to go home with foley  ZIMMERMAN,DONIELLE MPA-C 01/02/2014,7:38 AM  Home soon recurrent urinary retention Will arrange fu with urology I have seen and examined [name] [name] and agree with the above assessment  and plan.  Grace Isaac MD Beeper [phone] Office [phone] 01/02/2014 11:49 AM

## 2014-01-02 NOTE — Progress Notes (Signed)
Patient is off bedrest from EPW removal; vital signs remain stable; no complaints; call bell within reach; will continue to monitor.  Rowe Pavy, RN

## 2014-01-02 NOTE — Progress Notes (Signed)
Medicare Important Message given? YES  (If response is "NO", the following Medicare IM given date fields will be blank)  Date Medicare IM given: 01/02/14 Medicare IM given by:  Lyfe Monger  

## 2014-01-02 NOTE — Progress Notes (Signed)
Physical Therapy Treatment Patient Details Name: [name] [name] MRN: [id number] DOB: 1940-03-19 Today's Date: 2014-01-26    History of Present Illness Pt admitted for redo AVR with CABGx 2    PT Comments    Pt continues to ambulate and move well but with difficulty recalling precautions. Went over precautions x 4 during session with pt able to state end of session. Son and pt educated throughout for mobility, plan and progression. Will continue to follow with pt encouraged to continued HEP today as well as 3 gait trials.   Follow Up Recommendations  Home health PT;Supervision for mobility/OOB     Equipment Recommendations  Rolling walker with 5" wheels;3in1 (PT)    Recommendations for Other Services       Precautions / Restrictions Precautions Precautions: Sternal    Mobility  Bed Mobility Overal bed mobility: Needs Assistance Bed Mobility: Rolling;Sidelying to Sit Rolling: Supervision Sidelying to sit: Supervision       General bed mobility comments: cues for sequence and precautions  Transfers Overall transfer level: Needs assistance   Transfers: Sit to/from Stand Sit to Stand: Supervision         General transfer comment: cues for hand placement and safety  Ambulation/Gait Ambulation/Gait assistance: Supervision Ambulation Distance (Feet): 500 Feet Assistive device: Rolling walker (2 wheeled);None Gait Pattern/deviations: Step-through pattern;Decreased stride length;Trunk flexed   Gait velocity interpretation: Below normal speed for age/gender General Gait Details: cues for posture, looking up and stepping into RW. Pt ambulated 34' without RW with no LOB but felt more comfortable with continued rW use at this time   Stairs Stairs: Yes Stairs assistance: Supervision Stair Management: One rail Right;Alternating pattern;Forwards Number of Stairs: 11 General stair comments: no difficulty with stairs  Wheelchair Mobility    Modified Rankin (Stroke  Patients Only)       Balance                                    Cognition Arousal/Alertness: Awake/alert Behavior During Therapy: WFL for tasks assessed/performed Overall Cognitive Status: Within Functional Limits for tasks assessed       Memory: Decreased recall of precautions              Exercises General Exercises - Lower Extremity Long Arc Quad: AROM;Seated;Both;15 reps Hip Flexion/Marching: AROM;Seated;Both;15 reps    General Comments        Pertinent Vitals/Pain Pain Assessment: No/denies pain  HR 68-73 with activity    Home Living                      Prior Function            PT Goals (current goals can now be found in the care plan section) Progress towards PT goals: Progressing toward goals    Frequency       PT Plan Current plan remains appropriate    Co-evaluation             End of Session   Activity Tolerance: Patient tolerated treatment well Patient left: in chair;with call bell/phone within reach;with family/visitor present     Time: 8921-1941 PT Time Calculation (min) (ACUTE ONLY): 25 min  Charges:  $Gait Training: 8-22 mins $Therapeutic Activity: 8-22 mins                    G CodesMelford Aase 01-26-2014, 8:24  AM Elwyn Reach, [place]

## 2014-01-02 NOTE — Discharge Summary (Signed)
[address]       [place] [address]             [phone]              Discharge Summary  Name: [name] [name] DOB: [birth year] 73 y.o. MRN: [id number]   Admission Date: 12/27/2013 Discharge Date: 01/03/2014    Admitting Diagnosis: Aortic stenosis Aortic insufficiency Coronary artery disease   Discharge Diagnosis:  Aortic stenosis Aortic insufficiency Coronary artery disease Postoperative atrial fibrillation Expected postoperative blood loss anemia Right pneumothorax  Past Medical History  Diagnosis Date  . Aortic stenosis   . Hyperlipidemia   . Heart murmur   . Sleep apnea     mild to moderate  . Glaucoma     uses eye drops daily  . CHF (congestive heart failure)     takes Furosemide daily      Procedures: REDO AORTIC VALVE REPLACEMENT (23MM MAGNA EASE PERICARDIAL TISSUE VALVE) CORONARY ARTERY BYPASS GRAFTING x 2 (SAPHENOUS VEIN GRAFT TO RIGHT CORONARY ARTERY AND LEFT INTERNAL MAMMARY ARTERY TO LEFT ANTERIOR DESCENDING ARTERY) ENDOSCOPIC VEIN HARVEST RIGHT THIGH - 12/27/2013    HPI:  The patient is a 73 y.o. 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 The patient had done well for the past 10 years until the last several months. He had increasing symptoms of congestive heart failure with dyspnea on exertion and near-syncope. Echocardiogram revealed at least moderate aortic insufficiency and significant aortic stenosis and a dilated ventricle with depressed LV function. Cardiac catheterization was performed by Dr. Martinique which demonstrated 70-80% proximal LAD lesion and 70% right lesion. The circumflex was intact with luminal irregularities, but no high-grade stenosis. A CT scan of the chest was performed to evaluate aortic size. The aorta was less than 4 cm in size and did not warrant replacement.  The patient was referred to Dr. Servando Snare for surgical consideration. It was felt that he would benefit from redo AVR with CABG x1 at this time. Because of his age of 89 years and history of undiagnosed anemia, it was felt that tissue valve was most appropriate and the patient agreed and signed informed consent. All risks, benefits and alternatives of surgery were explained in detail, and the patient agreed to proceed.   Hospital Course:  The patient was admitted to [facility] on 12/27/2013. The patient was taken to the operating room and underwent the above procedure.    The postoperative course was notable for atrial fibrillation, which was treated with IV Amiodarone. He ultimately did convert to sinus rhythm.  He was not started on a beta blocker due to 1st degree AV block.  On postop day 3, the patient developed an increased right pneumothorax and required chest tube placement at the bedside. The pneumothorax did resolve and the chest tube was discontinued on postop day 5. He also had some initial confusion which was treated conservatively with minimization of narcotics and resolved.  He has been anemic postop (although he was also noted to be anemic prior to surgery) and did require a transfusion.  He also been started on iron and folic acid.  The patient developed urinary retention and a Foley catheter was replaced. He will be discharged with the foley catheter. He will have outpatient follow-up with Dr [name].    He is otherwise progressing well. Incisions are healing  well.  The patient is ambulating in the halls and tolerating a regular diet.  He remains in sinus rhythm.  Mental status has returned to baseline.     Recent vital signs:  Filed Vitals:   01/03/14 0421  BP: 118/66  Pulse: 64  Temp: 98.4 F (36.9 C)  Resp: 16    Recent laboratory studies:  CBC:  Recent Labs  01/01/14 0500 01/02/14 0544  WBC 7.5 6.8  HGB 8.9* 8.7*  HCT 26.6* 26.1*  PLT 116* 131*   BMET:   Recent  Labs  01/01/14 0500 01/02/14 0544  NA 137 134*  K 3.6* 3.6  CL 101 106  CO2 23 24  GLUCOSE 94 110*  BUN 16 15  CREATININE 0.70 0.83  CALCIUM 8.4 7.8*    PT/INR: No results for input(s): LABPROT, INR in the last 72 hours.   Discharge Medications:     Medication List    STOP taking these medications        furosemide 40 MG tablet  Commonly known as:  LASIX     potassium chloride 10 MEQ tablet  Commonly known as:  K-DUR      TAKE these medications        amiodarone 200 MG tablet  Commonly known as:  PACERONE  Take 1 tablet (200 mg total) by mouth 2 (two) times daily. For one week;then take Amiodarone 200 mg by mouth daily thereafter     amoxicillin 500 MG tablet  Commonly known as:  AMOXIL  4 tablets 1 hour prior to procedure     aspirin 325 MG EC tablet  Take 1 tablet (325 mg total) by mouth daily.     atorvastatin 20 MG tablet  Commonly known as:  LIPITOR  Take 1 tablet (20 mg total) by mouth daily at 6 PM.     brimonidine 0.15 % ophthalmic solution  Commonly known as:  ALPHAGAN  Place 3 drops into both eyes daily.     ferrous sulfate 325 (65 FE) MG tablet  Take 1 tablet (325 mg total) by mouth daily with breakfast. For one month then stop.     latanoprost 0.005 % ophthalmic solution  Commonly known as:  XALATAN  Place 1 drop into both eyes at bedtime.     lisinopril 5 MG tablet  Commonly known as:  PRINIVIL,ZESTRIL  1 tablet daily in the morning     tamsulosin 0.4 MG Caps capsule  Commonly known as:  FLOMAX  Take 1 capsule (0.4 mg total) by mouth daily.     traMADol 50 MG tablet  Commonly known as:  ULTRAM  Take 1 tablet (50 mg total) by mouth every 4 (four) hours as needed for moderate pain.     vitamin C 500 MG tablet  Commonly known as:  ASCORBIC ACID  Take 500 mg by mouth daily.         Discharge Instructions:  The patient is to refrain from driving, heavy lifting or strenuous activity.  May shower daily and clean incisions with soap  and water.  May resume regular diet.   Follow Up: Follow-up Information    Follow up with Grace Isaac, MD On 02/08/2014.   Specialty:  Cardiothoracic Surgery   Why:  Have a chest x-ray at [facility] at 9:30, then see MD at 10:30   Contact information:   [place] [address] [phone]       Follow up with Darlin Coco, MD On  01/22/2014.   Specialty:  Cardiology   Why:  1/11 @ 4:15pm with Dr Salley Scarlet information:   [address] [place] [address] [phone]       Follow up with Arvil Persons, MD.   Specialty:  Urology   Why:  01/16/2014 at 2:15 for urologist follow-up as you are going home with foley catheter   Contact information:   [place] [address] [phone]       Follow up with Grace Isaac, MD On 01/10/2014.   Specialty:  Cardiothoracic Surgery   Why:  Office will contact you with appointment time. Appointment is with nurse only to have right chest tube suture removed.   Contact information:   [place] [address] [phone]      The patient has been discharged on:  1.Beta Blocker: Yes [  ]  No [ x ]  If No, reason: 1st degree AV block   2.Ace Inhibitor/ARB: Yes [  x]  No [  ]  If No, reason:    3.Statin: Yes [ x ]  No [ ]   If No, reason:    4.Ecasa: Yes [ x ]  No [ ]   If No, reason:    Follow-up Information    Follow up with Grace Isaac, MD On 02/08/2014.   Specialty:  Cardiothoracic Surgery   Why:  Have a chest x-ray at [facility] at 9:30, then see MD at 10:30   Contact information:   [address] [place] [address] [phone]       Follow up with Darlin Coco, MD On 01/22/2014.   Specialty:  Cardiology   Why:  1/11 @ 4:15pm with Dr Salley Scarlet information:   [place] [address] [place] [address] [phone]       Follow up with Arvil Persons,  MD.   Specialty:  Urology   Why:  01/16/2014 at 2:15 for urologist follow-up as you are going home with foley catheter   Contact information:   [place]  [address] [phone]       Follow up with Grace Isaac, MD On 01/10/2014.   Specialty:  Cardiothoracic Surgery   Why:  Office will contact you with appointment time. Appointment is with nurse only to have right chest tube suture removed.   Contact information:   [place] [address] [phone]       Nani Skillern 01/03/2014, 8:26 AM

## 2014-01-02 NOTE — Progress Notes (Signed)
DC EPW per MD orders and protocol; Midsternal incision and left leg cleansed; patient tolerated EPW removal well; bedrest for one hour-bed alarm on; Q15 vitals for one hour; call bell within reach; will continue to monitor.  Rowe Pavy, RN

## 2014-01-03 MED ORDER — TAMSULOSIN HCL 0.4 MG PO CAPS: 0.4000 mg | ORAL_CAPSULE | Freq: Every day | ORAL | Status: DC

## 2014-01-03 MED ORDER — ATORVASTATIN CALCIUM 20 MG PO TABS: 20.0000 mg | ORAL_TABLET | Freq: Every day | ORAL | Status: DC

## 2014-01-03 MED ORDER — AMIODARONE HCL 200 MG PO TABS: 200.0000 mg | ORAL_TABLET | Freq: Two times a day (BID) | ORAL | Status: DC

## 2014-01-03 MED ORDER — FERROUS SULFATE 325 (65 FE) MG PO TABS: 325.0000 mg | ORAL_TABLET | Freq: Every day | ORAL | Status: DC

## 2014-01-03 MED ORDER — LISINOPRIL 5 MG PO TABS: ORAL_TABLET | ORAL | Status: DC

## 2014-01-03 MED ORDER — MOVING RIGHT ALONG BOOK
Freq: Once | Status: AC
Start: 2014-01-03 — End: 2014-01-03
  Administered 2014-01-03: 13:00:00
  Filled 2014-01-03: qty 1

## 2014-01-03 MED ORDER — AMIODARONE HCL 200 MG PO TABS
200.0000 mg | ORAL_TABLET | Freq: Two times a day (BID) | ORAL | Status: DC
  Administered 2014-01-03: 200 mg via ORAL
  Filled 2014-01-03 (×2): qty 1

## 2014-01-03 MED ORDER — TRAMADOL HCL 50 MG PO TABS: 50.0000 mg | ORAL_TABLET | ORAL | Status: DC | PRN

## 2014-01-03 MED ORDER — ASPIRIN 325 MG PO TBEC: 325.0000 mg | DELAYED_RELEASE_TABLET | Freq: Every day | ORAL | Status: DC

## 2014-01-03 NOTE — Progress Notes (Signed)
01/03/2014 10:21 AM cts d/c per order and per protocol. Benzoin and steri strips applied. Right chest CTS kept in place per verbal order Lars Pinks PAC.  Hady Niemczyk, Arville Lime

## 2014-01-03 NOTE — Progress Notes (Addendum)
01/03/2014 12:38 PM Nursing note Dc avs form, medications already taken today and those due this evening given and explained to patient and family. Follow up appointments, incision site care, activity restrictions and when to call MD reviewed. Moving right along book reviewed. PT. And family viewed d/c video #113.  Foley care and use of leg bag reviewed with patient and wife. Printed instructions for foley care provided.  Questions and concerns addressed. D/c iv. D/c tele. D/c home per orders.   Darrien Laakso, Arville Lime

## 2014-01-03 NOTE — Progress Notes (Signed)
CARDIAC REHAB PHASE I   Ed completed with wife. Pt slept. She voiced understanding, asking appropriate questions. Gave wife [facility] booklet at her request and set up video. Interested in [facility] and will send referral to [place]. Wife most concerned that pt is going to do too much and not follow sternal precautions.  [place], ACSM 11:31 AM 01/03/2014

## 2014-01-03 NOTE — Progress Notes (Addendum)
      [address]       [place] [address]             [phone]        7 Days Post-Op Procedure(s) (LRB): REDO AORTIC VALVE REPLACEMENT WITH 23MM MAGNA EASE PERICARDIAL TISSUE VALVE (N/A) TRANSESOPHAGEAL ECHOCARDIOGRAM (TEE) (N/A) CORONARY ARTERY BYPASS GRAFTING TIMES TWO USING RIGHT LEG SAPHENOUS VEIN TO RIGHT CORONARY ARTERY AND LEFT INTERNAL MAMMARY ARTERY TO LEFT ANTERIOR DESCENDING ARTERY (N/A)  Subjective: Patient about to eat breakfast. Had bowel movement yesterday.  Objective: Vital signs in last 24 hours: Temp:  [98.4 F (36.9 C)-99 F (37.2 C)] 98.4 F (36.9 C) (12/23 0421) Pulse Rate:  [64-73] 64 (12/23 0421) Cardiac Rhythm:  [-] Heart block (12/22 2045) Resp:  [16-18] 16 (12/23 0421) BP: (110-130)/(65-85) 118/66 mmHg (12/23 0421) SpO2:  [96 %-97 %] 96 % (12/23 0421) Weight:  [160 lb 8 oz (72.802 kg)] 160 lb 8 oz (72.802 kg) (12/23 0426)  Pre op weight 72 kg Current Weight  01/03/14 160 lb 8 oz (72.802 kg)      Intake/Output from previous day: 12/22 0701 - 12/23 0700 In: 720 [P.O.:720] Out: 1200 [Urine:1200]   Physical Exam:  Cardiovascular: RRR, no murmurs Pulmonary: Mostly clear but diminished at bases. Abdomen: Soft, non tender, bowel sounds present. Extremities: No lower extremity edema. Wounds: Clean and dry.  No erythema or signs of infection.  Lab Results: CBC:  Recent Labs  01/01/14 0500 01/02/14 0544  WBC 7.5 6.8  HGB 8.9* 8.7*  HCT 26.6* 26.1*  PLT 116* 131*   BMET:   Recent Labs  01/01/14 0500 01/02/14 0544  NA 137 134*  K 3.6* 3.6  CL 101 106  CO2 23 24  GLUCOSE 94 110*  BUN 16 15  CREATININE 0.70 0.83  CALCIUM 8.4 7.8*    PT/INR:  Lab Results  Component Value Date   INR 1.02 12/26/2013   INR 1.0 12/14/2013   ABG:  INR: Will add last result for INR, ABG once components are confirmed Will add last 4 CBG results once components are confirmed  Assessment/Plan:  1. CV - Maintaining SR in  the 60-70's. On Amiodarone 400 bid and Lisinopril 5 daily. Will decrease Amiodarone to 200 bid. 2.  Pulmonary - On room air.Encourage incentive spirometer 3. Volume Overload - On Lasix 40 daily. Is at per op weight and no LE edema. Was on daily Lasix prior to surgery. As discussed with Dr. Servando Snare, stop. 4.  Acute blood loss anemia - H and H stable at 8.7 and 26.1. On Ferrous sulfate and folic acid. 5. Urinary retention-required foley re insertion. Continue Flomax and continue foley. 6. Thrombocytopenia-platelets up to 131,000 7. Will discuss timing of discharge. He will need to go with foley and follow up with urology. 8. Remove 2 sutures on lower abdomen. Suture at right chest is to remain. 9. As discussed with Dr. Servando Snare, ok to discharge with foley  ZIMMERMAN,DONIELLE MPA-C 01/03/2014,7:39 AM    plan d/c today with foley, urology office conatct and has follow up to get foley out I have seen and examined [name] [name] and agree with the above assessment  and plan.  Grace Isaac MD Beeper [phone] Office [phone] 01/03/2014 1:46 PM

## 2014-01-03 NOTE — Progress Notes (Signed)
Pt ambulated 150 ft x1 assist using a front wheel walker on RA. Pt tolerated ambulation well with no complaints of pain.Pt back to bed with call light in reach. Will continue to monitor.

## 2014-01-03 NOTE — Care Management Note (Signed)
    Page 1 of 2   01/03/2014     2:29:03 PM CARE MANAGEMENT NOTE 01/03/2014  Patient:  [name] [name], [name] [name]   Account Number:  [id number]  Date Initiated:  12/28/2013  Documentation initiated by:  MAYO,HENRIETTA  Subjective/Objective Assessment:   s/p AVR/CABG; lives with spouse    PCP  Daiva Eves     Action/Plan:   Anticipated DC Date:  01/03/2014   Anticipated DC Plan:  [place]  CM consult      Deal   Choice offered to / List presented to:  C-3 Spouse   DME arranged  Vassie Moselle      DME agency  [facility] arranged  HH-1 RN      [place].   Status of service:  Completed, signed off Medicare Important Message given?  YES (If response is "NO", the following Medicare IM given date fields will be blank) Date Medicare IM given:  01/02/2014 Medicare IM given by:  Marvetta Gibbons Date Additional Medicare IM given:   Additional Medicare IM given by:    Discharge Disposition:  [place]  Per UR Regulation:  Reviewed for med. necessity/level of care/duration of stay  If discussed at [place] of Stay Meetings, dates discussed:   01/02/2014    Comments:  01/03/14- 59- Marvetta Gibbons RN, BSN [phone] Pt for home today, order for rollator-, spoke with Brenton Grills from [facility] regarding DME need- to f/u with pt/wife- wife also concerned about pt going home with foley- order placed for HH-RN- spoke with wife at bedside- choice offered for [facility] - per wife would like to use [facility] services- Referral called to Amy with [facility]- referral accepted.  12/28/13  [place] MSN BSN CCM Spouse and dtr visiting, spouse states she will be available to assist pt as needed 24/7 when medically ready for discharge.  Extubated, O2 sats 90s-100 on 2L/min [place].

## 2014-01-10 ENCOUNTER — Telehealth: Payer: Self-pay | Admitting: *Deleted

## 2014-01-10 ENCOUNTER — Ambulatory Visit (INDEPENDENT_AMBULATORY_CARE_PROVIDER_SITE_OTHER): Payer: Self-pay

## 2014-01-10 DIAGNOSIS — I350 Nonrheumatic aortic (valve) stenosis: Secondary | ICD-10-CM

## 2014-01-10 DIAGNOSIS — Z4802 Encounter for removal of sutures: Secondary | ICD-10-CM

## 2014-01-10 MED ORDER — LISINOPRIL 10 MG PO TABS: ORAL_TABLET | ORAL | Status: DC

## 2014-01-10 NOTE — Telephone Encounter (Signed)
error 

## 2014-01-10 NOTE — Progress Notes (Signed)
Removed 2 sutures from incision sites, no signs of injection and pt tolerated well.

## 2014-01-15 DIAGNOSIS — I10 Essential (primary) hypertension: Secondary | ICD-10-CM | POA: Diagnosis not present

## 2014-01-15 DIAGNOSIS — D649 Anemia, unspecified: Secondary | ICD-10-CM | POA: Diagnosis not present

## 2014-01-15 DIAGNOSIS — Z48812 Encounter for surgical aftercare following surgery on the circulatory system: Secondary | ICD-10-CM | POA: Diagnosis not present

## 2014-01-15 DIAGNOSIS — Z4801 Encounter for change or removal of surgical wound dressing: Secondary | ICD-10-CM | POA: Diagnosis not present

## 2014-01-15 DIAGNOSIS — Z953 Presence of xenogenic heart valve: Secondary | ICD-10-CM | POA: Diagnosis not present

## 2014-01-15 DIAGNOSIS — I2510 Atherosclerotic heart disease of native coronary artery without angina pectoris: Secondary | ICD-10-CM | POA: Diagnosis not present

## 2014-01-16 DIAGNOSIS — S21001A Unspecified open wound of right breast, initial encounter: Secondary | ICD-10-CM | POA: Diagnosis not present

## 2014-01-16 DIAGNOSIS — R339 Retention of urine, unspecified: Secondary | ICD-10-CM | POA: Diagnosis not present

## 2014-01-16 DIAGNOSIS — R32 Unspecified urinary incontinence: Secondary | ICD-10-CM | POA: Diagnosis not present

## 2014-01-16 DIAGNOSIS — N401 Enlarged prostate with lower urinary tract symptoms: Secondary | ICD-10-CM | POA: Diagnosis not present

## 2014-01-16 DIAGNOSIS — N390 Urinary tract infection, site not specified: Secondary | ICD-10-CM | POA: Diagnosis not present

## 2014-01-18 DIAGNOSIS — Z953 Presence of xenogenic heart valve: Secondary | ICD-10-CM | POA: Diagnosis not present

## 2014-01-18 DIAGNOSIS — I2510 Atherosclerotic heart disease of native coronary artery without angina pectoris: Secondary | ICD-10-CM | POA: Diagnosis not present

## 2014-01-18 DIAGNOSIS — I10 Essential (primary) hypertension: Secondary | ICD-10-CM | POA: Diagnosis not present

## 2014-01-18 DIAGNOSIS — Z48812 Encounter for surgical aftercare following surgery on the circulatory system: Secondary | ICD-10-CM | POA: Diagnosis not present

## 2014-01-18 DIAGNOSIS — D649 Anemia, unspecified: Secondary | ICD-10-CM | POA: Diagnosis not present

## 2014-01-18 DIAGNOSIS — Z4801 Encounter for change or removal of surgical wound dressing: Secondary | ICD-10-CM | POA: Diagnosis not present

## 2014-01-19 DIAGNOSIS — N390 Urinary tract infection, site not specified: Secondary | ICD-10-CM | POA: Diagnosis not present

## 2014-01-22 ENCOUNTER — Ambulatory Visit (INDEPENDENT_AMBULATORY_CARE_PROVIDER_SITE_OTHER): Payer: Medicare Other | Admitting: Cardiology

## 2014-01-22 ENCOUNTER — Encounter: Payer: Self-pay | Admitting: Cardiology

## 2014-01-22 VITALS — BP 122/63 | HR 70 | Ht 71.0 in | Wt 163.0 lb

## 2014-01-22 DIAGNOSIS — Z954 Presence of other heart-valve replacement: Secondary | ICD-10-CM

## 2014-01-22 DIAGNOSIS — Z952 Presence of prosthetic heart valve: Secondary | ICD-10-CM

## 2014-01-22 DIAGNOSIS — D649 Anemia, unspecified: Secondary | ICD-10-CM | POA: Diagnosis not present

## 2014-01-22 DIAGNOSIS — I2510 Atherosclerotic heart disease of native coronary artery without angina pectoris: Secondary | ICD-10-CM

## 2014-01-22 NOTE — Patient Instructions (Signed)
Your physician recommends that you continue on your current medications as directed. Please refer to the Current Medication list given to you today.  Your physician recommends that you schedule a follow-up appointment in: 1 month ov/ekg/cbc/hfp/tsh/lp/bmet (fasting labs)

## 2014-01-22 NOTE — Progress Notes (Signed)
[name] [name] Date of Birth:  [birth year] [facility] [address] [place], [place]  [address] [phone]        Fax   [phone]   History of Present Illness: This pleasant 74 year old gentleman is seen for a post hospital  office visit. 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 His surgery was by Dr. Servando Snare in 02/09/03. At the time of his cardiac catheterization he did not have any significant coronary disease and did not require bypass surgery. He also has a history of hypercholesterolemia. He also has a history of BPH with nocturia and his PSA a year ago was 3.31, and a repeat PSA on 05/06/11 was elevated at 5.87. He was advised at that time to see a urologist but did not see one. He saw his PCP. The patient started eating pumpkinseed's and this has brought his PSA down to its current level of 3.2. Of note is the fact that he tells me that his father died of prostate cancer with bony metastases at age 31.  The patient now has a urologist, Dr. [name]. After his last visit in October 2014 the patient underwent an chest x-ray which showed heart size at upper normal and lungs were clear. On 11/14/12 patient had an echocardiogram showing an ejection fraction of 60-65% with grade 2 diastolic dysfunction. His prosthetic valve showed moderate aortic stenosis with peak gradient 55 and a mean gradient of 22. He also has moderate aortic insufficiency.  Several months ago patient developed symptoms of increased dyspnea.  He has developed orthopnea.  This began in October 2015.  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  His left ventricle was dilated and his ejection fraction had fallen to 30-35%.  He underwent repeat cardiac catheterization and then underwent a redo aortic valve replacement with a pericardial  tissue valve as well as two-vessel coronary artery bypass graft surgery by Dr. Servando Snare on 12/27/13.  The patient had postoperative atrial fibrillation.  He was discharged home on amiodarone.  He is now back in normal sinus rhythm.  He has noted occasional irregular heartbeat.  Current Outpatient Prescriptions  Medication Sig Dispense Refill  . amiodarone (PACERONE) 200 MG tablet Take 1 tablet (200 mg total) by mouth 2 (two) times daily. For one week;then take Amiodarone 200 mg by mouth daily thereafter 30 tablet 1  . Ascorbic Acid (VITAMIN C PO) Take 1 capsule by mouth daily.    Marland Kitchen aspirin EC 325 MG EC tablet Take 1 tablet (325 mg total) by mouth daily. 30 tablet 0  . atorvastatin (LIPITOR) 20 MG tablet Take 1 tablet (20 mg total) by mouth daily at 6 PM. 30 tablet 1  . brimonidine (ALPHAGAN) 0.15 % ophthalmic solution Place 3 drops into both eyes daily.     Marland Kitchen CRANBERRY CONCENTRATE PO Take 2 tablespoons by mouth daily    . CRANBERRY EXTRACT PO Take 1 capsule by mouth daily    . ferrous sulfate 325 (65 FE) MG tablet Take 1 tablet (325 mg total) by mouth daily with breakfast. For one month then stop. 30 tablet 1  . latanoprost (XALATAN) 0.005 % ophthalmic solution Place 1 drop into both eyes at bedtime.     Marland Kitchen lisinopril (PRINIVIL,ZESTRIL) 5 MG tablet Take 1 tablet by mouth daily.    . Probiotic Product (PROBIOTIC  DAILY PO) Take 1 tablet by mouth daily.    . tamsulosin (FLOMAX) 0.4 MG CAPS capsule Take 1 capsule (0.4 mg total) by mouth daily. 30 capsule 1  . vitamin C (ASCORBIC ACID) 500 MG tablet Take 500 mg by mouth daily.    Marland Kitchen amoxicillin (AMOXIL) 500 MG tablet 4 tablets 1 hour prior to procedure (Patient not taking: Reported on 01/22/2014) 20 tablet 1   No current facility-administered medications for this visit.    Allergies  Allergen Reactions  . Oxycodone Other (See Comments)    Hallucinations    Patient Active Problem List   Diagnosis Date Noted  . Incidental right lung nodule, > 57mm  and < 54mm 12/27/2013  . S/P AVR 12/27/2013  . Left ventricular systolic dysfunction 00/34/9179  . 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 Aortic insufficiency and aortic stenosis 12/23/2012  . PVC's (premature ventricular contractions) 12/23/2012  . PAC (premature atrial contraction) 12/23/2012  . Systolic hypertension 15/05/6977  . H/O aortic valve replacement with tissue graft 10/12/2012  . Hypercholesterolemia 03/11/2011  . BPH associated with nocturia 03/11/2011  . Aortic stenosis   . Hypersomnia with sleep apnea, unspecified   . Glaucoma     History  Smoking status  . Never Smoker   Smokeless tobacco  . Never Used    History  Alcohol Use No    Family History  Problem Relation Age of Onset  . Heart disease Mother   . Cancer Father     Review of Systems: Constitutional: no fever chills diaphoresis or fatigue or change in weight.  Head and neck: no hearing loss, no epistaxis, no photophobia or visual disturbance. Respiratory: No cough, shortness of breath or wheezing. Cardiovascular: No chest pain peripheral edema, palpitations. Gastrointestinal: No abdominal distention, no abdominal pain, no change in bowel habits hematochezia or melena. Genitourinary: No dysuria, no frequency, no urgency, no nocturia. Musculoskeletal:No arthralgias, no back pain, no gait disturbance or myalgias. Neurological: No dizziness, no headaches, no numbness, no seizures, no syncope, no weakness, no tremors. Hematologic: No lymphadenopathy, no easy bruising. Psychiatric: No confusion, no hallucinations, no sleep disturbance.    Physical Exam: Filed Vitals:   01/22/14 1619  BP: 122/63  Pulse: 70  The patient appears to be in no distress.  Head and neck exam reveals that the pupils are equal and reactive.  The extraocular movements are full.  There is no scleral icterus.  Mouth and pharynx are benign.  No lymphadenopathy.  No carotid bruits.  The jugular venous pressure is normal.   Thyroid is not enlarged or tender.  Carotid upstroke is normal.  His aortic systolic murmur is audible in the carotids.  Chest reveals clear lung fields bilaterally.  No rales.  Heart reveals no abnormal lift or heave.  First and second heart sounds are normal.  There is a soft systolic ejection murmur across the prosthetic valve.  There is no diastolic murmur.  There is no gallop or rub.  The abdomen is soft and nontender.  Bowel sounds are normoactive.  There is no hepatosplenomegaly or mass.  There are no abdominal bruits.  Extremities reveal no phlebitis or edema.  Pedal pulses are good.  There is no cyanosis or clubbing.  Neurologic exam is normal strength and no lateralizing weakness.  No sensory deficits.  Integument reveals no rash  EKG today shows normal sinus rhythm with first-degree AV block.  There are ST and T-wave abnormalities consistent with inferolateral ischemia or strain  Assessment / Plan: 1. aortic  valve disease status post bioprosthetic aortic valve replacement 02/06/03 2.  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  He also underwent CABG 2.  The date of his surgery was 12/27/13 3.  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 history of sleep apnea followed by Dr. Baird Lyons 5. past history of elevated PSA.  Patient has symptoms of urinary tract infection with frequency and nocturia.  He now has a urologist, Dr. [name]. 6.  Postoperative atrial fibrillation, now back in sinus rhythm on amiodarone  Plan:  Continue current medication.  Recheck in one month for follow-up office visit and EKG.  At that point if he is still in sinus rhythm we would probably stop his amiodarone.  At that next visit we will get fasting lipid panel TSH hepatic function panel basal metabolic panel and CBC. Regarding his urinary tract symptoms he will follow-up with his urologist.

## 2014-01-23 ENCOUNTER — Encounter: Payer: Self-pay | Admitting: Cardiology

## 2014-01-24 DIAGNOSIS — Z48812 Encounter for surgical aftercare following surgery on the circulatory system: Secondary | ICD-10-CM | POA: Diagnosis not present

## 2014-01-24 DIAGNOSIS — I10 Essential (primary) hypertension: Secondary | ICD-10-CM | POA: Diagnosis not present

## 2014-01-24 DIAGNOSIS — I2510 Atherosclerotic heart disease of native coronary artery without angina pectoris: Secondary | ICD-10-CM | POA: Diagnosis not present

## 2014-01-24 DIAGNOSIS — Z953 Presence of xenogenic heart valve: Secondary | ICD-10-CM | POA: Diagnosis not present

## 2014-01-24 DIAGNOSIS — Z4801 Encounter for change or removal of surgical wound dressing: Secondary | ICD-10-CM | POA: Diagnosis not present

## 2014-01-24 DIAGNOSIS — D649 Anemia, unspecified: Secondary | ICD-10-CM | POA: Diagnosis not present

## 2014-01-26 DIAGNOSIS — Z4801 Encounter for change or removal of surgical wound dressing: Secondary | ICD-10-CM | POA: Diagnosis not present

## 2014-01-26 DIAGNOSIS — Z953 Presence of xenogenic heart valve: Secondary | ICD-10-CM | POA: Diagnosis not present

## 2014-01-26 DIAGNOSIS — I10 Essential (primary) hypertension: Secondary | ICD-10-CM | POA: Diagnosis not present

## 2014-01-26 DIAGNOSIS — D649 Anemia, unspecified: Secondary | ICD-10-CM | POA: Diagnosis not present

## 2014-01-26 DIAGNOSIS — Z48812 Encounter for surgical aftercare following surgery on the circulatory system: Secondary | ICD-10-CM | POA: Diagnosis not present

## 2014-01-26 DIAGNOSIS — I2510 Atherosclerotic heart disease of native coronary artery without angina pectoris: Secondary | ICD-10-CM | POA: Diagnosis not present

## 2014-01-27 DIAGNOSIS — Z48812 Encounter for surgical aftercare following surgery on the circulatory system: Secondary | ICD-10-CM | POA: Diagnosis not present

## 2014-01-30 DIAGNOSIS — Z4801 Encounter for change or removal of surgical wound dressing: Secondary | ICD-10-CM | POA: Diagnosis not present

## 2014-01-30 DIAGNOSIS — I2510 Atherosclerotic heart disease of native coronary artery without angina pectoris: Secondary | ICD-10-CM | POA: Diagnosis not present

## 2014-01-30 DIAGNOSIS — D649 Anemia, unspecified: Secondary | ICD-10-CM | POA: Diagnosis not present

## 2014-01-30 DIAGNOSIS — I10 Essential (primary) hypertension: Secondary | ICD-10-CM | POA: Diagnosis not present

## 2014-01-30 DIAGNOSIS — Z953 Presence of xenogenic heart valve: Secondary | ICD-10-CM | POA: Diagnosis not present

## 2014-01-30 DIAGNOSIS — Z48812 Encounter for surgical aftercare following surgery on the circulatory system: Secondary | ICD-10-CM | POA: Diagnosis not present

## 2014-02-06 ENCOUNTER — Other Ambulatory Visit: Payer: Self-pay | Admitting: Cardiothoracic Surgery

## 2014-02-06 DIAGNOSIS — I350 Nonrheumatic aortic (valve) stenosis: Secondary | ICD-10-CM

## 2014-02-08 ENCOUNTER — Ambulatory Visit (INDEPENDENT_AMBULATORY_CARE_PROVIDER_SITE_OTHER): Payer: Self-pay | Admitting: Cardiothoracic Surgery

## 2014-02-08 ENCOUNTER — Ambulatory Visit
Admission: RE | Admit: 2014-02-08 | Discharge: 2014-02-08 | Disposition: A | Discharge status: Home / Self Care | Payer: Medicare Other | Source: Ambulatory Visit | Attending: Cardiothoracic Surgery | Admitting: Cardiothoracic Surgery

## 2014-02-08 ENCOUNTER — Encounter: Payer: Self-pay | Admitting: Cardiothoracic Surgery

## 2014-02-08 VITALS — BP 126/73 | HR 60 | Resp 20 | Ht 71.0 in | Wt 165.0 lb

## 2014-02-08 DIAGNOSIS — Z951 Presence of aortocoronary bypass graft: Secondary | ICD-10-CM

## 2014-02-08 DIAGNOSIS — Z954 Presence of other heart-valve replacement: Secondary | ICD-10-CM

## 2014-02-08 DIAGNOSIS — I350 Nonrheumatic aortic (valve) stenosis: Secondary | ICD-10-CM

## 2014-02-08 DIAGNOSIS — I2510 Atherosclerotic heart disease of native coronary artery without angina pectoris: Secondary | ICD-10-CM

## 2014-02-08 DIAGNOSIS — Z952 Presence of prosthetic heart valve: Secondary | ICD-10-CM

## 2014-02-08 DIAGNOSIS — I351 Nonrheumatic aortic (valve) insufficiency: Secondary | ICD-10-CM

## 2014-02-08 NOTE — Progress Notes (Signed)
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 SURGICAL PROCEDURE: Redo aortic valve replacement with a #23 [place] pericardial tissue valve, model 3300TFX, serial [id number] and coronary artery bypass grafting x2 with the left internal mammary to the left anterior descending coronary artery and reverse saphenous vein graft to the distal right coronary artery with right thigh EndoVein harvesting. SURGEON: Lanelle Bal, MD  History of Present Illness:     Patient is doing well postoperatively, his urinary symptoms requiring discharged home with a Foley catheter in place have improved in the Foley catheter has been removed. He denies any voiding difficulties currently. He remains active at home, and wants to increase his activity level. He's had no overt symptoms of congestive heart failure. He  has a history of BPH with nocturia and his PSA a year ago was 3.31, and a repeat PSA on 05/06/11 was elevated at 5.87.  Of note is the fact that he tells me that his father died of prostate cancer with bony metastases at age 61. The patient now has a urologist, Dr. [name] who saw him after he was discharged home with a Foley catheter in place  At the time of his preop CT scan of the chest evaluate the size of his aorta a 7 mm right lung nodule was appreciated and will need follow-up.   Past Medical History  Diagnosis Date  . Aortic stenosis   . Hyperlipidemia   . Heart murmur    . Sleep apnea     mild to moderate  . Glaucoma     uses eye drops daily  . CHF (congestive heart failure)     takes Furosemide daily     History  Smoking status  . Never Smoker   Smokeless tobacco  . Never Used    History  Alcohol Use No     Allergies  Allergen Reactions  . Oxycodone Other (See Comments)    Hallucinations    Current Outpatient Prescriptions  Medication Sig Dispense Refill  . amiodarone (PACERONE) 200 MG tablet Take 1 tablet (200 mg total) by mouth 2 (two) times daily. For one week;then take Amiodarone 200 mg by mouth daily thereafter (Patient taking differently: Take 200 mg by mouth daily. For one week;then take Amiodarone 200 mg by mouth daily thereafter) 30 tablet 1  . amoxicillin (AMOXIL) 500 MG tablet 4 tablets 1 hour prior to procedure 20 tablet 1  . aspirin EC 325 MG EC tablet Take 1 tablet (325 mg total) by mouth daily. 30 tablet 0  . atorvastatin (LIPITOR) 20 MG tablet Take 1 tablet (20 mg total) by mouth daily at 6 PM. 30 tablet 1  . brimonidine (ALPHAGAN) 0.15 % ophthalmic solution Place 3 drops into both eyes daily.     Marland Kitchen latanoprost (XALATAN) 0.005 % ophthalmic solution Place 1 drop into both eyes at bedtime.     Marland Kitchen lisinopril (PRINIVIL,ZESTRIL) 5 MG tablet Take 1 tablet by mouth daily.    Marland Kitchen  Probiotic Product (PROBIOTIC DAILY PO) Take 1 tablet by mouth daily.    . tamsulosin (FLOMAX) 0.4 MG CAPS capsule Take 0.4 mg by mouth daily.     No current facility-administered medications for this visit.       Physical Exam: BP 126/73 mmHg  Pulse 60  Resp 20  Ht 5\' 11"  (1.803 m)  Wt 165 lb (74.844 kg)  BMI 23.02 kg/m2  SpO2 96%  General appearance: alert and cooperative Neurologic: intact Heart: regular rate and rhythm, S1, S2 normal, no murmur, click, rub or gallop Lungs: clear to auscultation bilaterally Abdomen: soft, non-tender; bowel sounds normal; no masses,  no organomegaly Extremities: extremities normal, atraumatic, no cyanosis or  edema, Homans sign is negative, no sign of DVT and no edema, redness or tenderness in the calves or thighs Wound: Patient sternum is stable and well healed   Diagnostic Studies & Laboratory data:     Recent Radiology Findings:   Dg Chest 2 View  02/08/2014   CLINICAL DATA:  Aortic stenosis, aortic valve replacement  EXAM: CHEST  2 VIEW  COMPARISON:  01/02/2014  FINDINGS: Cardiomediastinal silhouette is stable. Again noted status post median sternotomy and aortic valve replacement. No acute infiltrate or pulmonary edema. Previous right apical pneumothorax has resolved.  IMPRESSION: No active cardiopulmonary disease. Status post median sternotomy and cardiac valve replacement.   Electronically Signed   By: Lahoma Crocker M.D.   On: 02/08/2014 09:50    I have independently reviewed the above radiology studies  and reviewed the findings with the patient.    Recent Lab Findings: Lab Results  Component Value Date   WBC 6.8 01/02/2014   HGB 8.7* 01/02/2014   HCT 26.1* 01/02/2014   PLT 131* 01/02/2014   GLUCOSE 110* 01/02/2014   CHOL 194 03/11/2011   TRIG 41.0 03/11/2011   HDL 61.80 03/11/2011   LDLCALC 124* 03/11/2011   ALT 15 12/31/2013   AST 14 12/31/2013   NA 134* 01/02/2014   K 3.6 01/02/2014   CL 106 01/02/2014   CREATININE 0.83 01/02/2014   BUN 15 01/02/2014   CO2 24 01/02/2014   INR 1.02 12/26/2013   HGBA1C 5.4 12/26/2013      Assessment / Plan:       aortic valve disease status post bioprosthetic aortic valve replacement 02/06/03  Redo aortic valve replacement for prosthetic valve failure. He also underwent CABG 2. The date of his surgery was 12/27/13  Severe left ventricular systolic dysfunction with ejection fraction 30-35%, new since the previous echo of 2014  past history of elevated PSA. Patient now followed by urology treated his acute urinary retention and urinary tract infection . He now has a urologist, Dr. [name]. Postoperative atrial fibrillation, now back in  sinus rhythm on amiodarone  Patient has 7 mm right lung nodule on preoperative CT scan, with the suggestion of follow-up CT scan in 3 months to ensure stability. The patient is a nonsmoker but does have a previous history of exposure to asbestos    Grace Isaac MD      [address] [place] [address] Office [phone]   Beeper [phone]  02/08/2014 10:58 AM

## 2014-02-08 NOTE — Patient Instructions (Signed)
No lifting over 20-25 lbs for 3 months  Endocarditis Information  You may be at risk for developing endocarditis since you have  an artificial heart valve  or a repaired heart valve. Endocarditis is an infection of the lining of the heart or heart valves.   Certain surgical and dental procedures may put you at risk,  such as teeth cleaning or other dental procedures or any surgery involving the respiratory, urinary, gastrointestinal tract, gallbladder or prostate.   Notify your doctor or dentist before having any invasive procedures. You will need to take antibiotics before certain procedures.   To prevent endocarditis, maintain good oral health. Seek prompt medical attention for any mouth/gum, skin or urinary tract infections.   Heart  Valve Replacement-Care After  Read the instructions outlined below and refer to this sheet for the next few weeks. These discharge instructions provide you with general information on caring for yourself after you leave the hospital. Your surgeon may also give you specific instructions. While your treatment has been planned according to the most current medical practices available, unavoidable complications occasionally occur. If you have any problems or questions after discharge, please call your surgeon. AFTER THE PROCEDURE  Full recovery from heart valve surgery can take several months.   Blood thinning (anticoagulation) treatment with warfarin is some times prescribed for 6 weeks to 3 months after surgery for those with biological valves. It is prescribed for life for those with mechanical valves.   Recovery includes healing of the surgical incision. There is a gradual building of stamina and exercise abilities. An exercise program under the direction of a physical therapist may be recommended.   Once you have an artificial valve, your heart function and your life will return to normal. You usually feel better after surgery. Shortness of breath and fatigue  should lessen. If your heart was already severely damaged before your surgery, you may continue to have problems.    Individuals with an aortic valve replacement need to take antibiotics before having dental work or other surgical procedures. This is called prophylactic antibiotic treatment. These drugs help to prevent infective endocarditis. Antibiotics are only recommended for individuals with the highest risk for developing infective endocarditis. Let your dentist and your caregiver know if you have a history of any of the following so that the necessary precautions can be taken:  Endocarditis in the past.   An artificial (prosthetic) heart valve.  HOME CARE INSTRUCTIONS   Use all medications as prescribed.   Take your temperature every morning for the first week after surgery. Record these.   Weigh yourself every morning for at least the first week after surgery and record.   Do not lift more than 15 pounds until your breastbone (sternum) has healed, about 3 months. Avoid all activities which would place strain on your incision.   You may shower as soon as directed by your caregiver after surgery. Pat incisions dry. Do not rub incisions with washcloth or towel.   Avoid driving for 4 weeks following surgery or as instructed.  Pain Control  If a prescription was given for a pain reliever, please follow your doctor's directions.   If the pain is not relieved by your medicine, becomes worse, or you have difficulty breathing, call your surgeon.  Activity  Take frequent rest periods throughout the day.   Wait one week before returning to strenuous activities such as heavy lifting (more than 10 pounds), pushing or pulling.   Talk with your doctor about when  you may return to work and your exercise routine.   Do not drive while taking prescription pain medication.  Nutrition  You may resume your normal diet.   Drink plenty of fluids (6-8 glasses a day).   Eat a well-balanced diet.    Call your caregiver for persistent nausea or vomiting.  Elimination Your normal bowel function should return. If constipation should occur, you may:  Take a mild laxative.   Add fruit and bran to your diet.   Drink more fluids.   Call your doctor if constipation is not relieved.  SEEK IMMEDIATE MEDICAL CARE IF:   You develop chest pain which is not coming from your surgical cut (incision).   You develop shortness of breath or have difficulty breathing.   You develop a temperature over 101 F (38.3 C).   You have a sudden weight gain. Let your caregiver know what the weight gain is.   You develop a rash.   You develop any reaction or side effects to medications given.   You have increased bleeding from wounds.   You see redness, swelling, or have increasing pain in wounds.   You have pus coming from your wound.   You develop lightheadedness or feel faint.   Endocarditis Information  You may be at risk for developing endocarditis since you have  an artificial heart valve  or a repaired heart valve. Endocarditis is an infection of the lining of the heart or heart valves.   Certain surgical and dental procedures may put you at risk,  such as teeth cleaning or other dental procedures or any surgery involving the respiratory, urinary, gastrointestinal tract, gallbladder or prostate.   Notify your doctor or dentist before having any invasive procedures. You will need to take antibiotics before certain procedures.   To prevent endocarditis, maintain good oral health. Seek prompt medical attention for any mouth/gum, skin or urinary tract infections.

## 2014-02-09 DIAGNOSIS — R339 Retention of urine, unspecified: Secondary | ICD-10-CM | POA: Diagnosis not present

## 2014-02-09 DIAGNOSIS — N390 Urinary tract infection, site not specified: Secondary | ICD-10-CM | POA: Diagnosis not present

## 2014-02-09 DIAGNOSIS — N401 Enlarged prostate with lower urinary tract symptoms: Secondary | ICD-10-CM | POA: Diagnosis not present

## 2014-02-13 DIAGNOSIS — H401233 Low-tension glaucoma, bilateral, severe stage: Secondary | ICD-10-CM | POA: Diagnosis not present

## 2014-02-14 DIAGNOSIS — N401 Enlarged prostate with lower urinary tract symptoms: Secondary | ICD-10-CM | POA: Diagnosis not present

## 2014-02-14 DIAGNOSIS — R339 Retention of urine, unspecified: Secondary | ICD-10-CM | POA: Diagnosis not present

## 2014-02-17 DIAGNOSIS — Z48812 Encounter for surgical aftercare following surgery on the circulatory system: Secondary | ICD-10-CM | POA: Diagnosis not present

## 2014-02-17 DIAGNOSIS — Z4801 Encounter for change or removal of surgical wound dressing: Secondary | ICD-10-CM | POA: Diagnosis not present

## 2014-02-17 DIAGNOSIS — D649 Anemia, unspecified: Secondary | ICD-10-CM | POA: Diagnosis not present

## 2014-02-17 DIAGNOSIS — I2510 Atherosclerotic heart disease of native coronary artery without angina pectoris: Secondary | ICD-10-CM | POA: Diagnosis not present

## 2014-02-17 DIAGNOSIS — I10 Essential (primary) hypertension: Secondary | ICD-10-CM | POA: Diagnosis not present

## 2014-02-17 DIAGNOSIS — Z953 Presence of xenogenic heart valve: Secondary | ICD-10-CM | POA: Diagnosis not present

## 2014-02-20 ENCOUNTER — Encounter: Payer: Self-pay | Admitting: Cardiology

## 2014-02-20 ENCOUNTER — Other Ambulatory Visit (INDEPENDENT_AMBULATORY_CARE_PROVIDER_SITE_OTHER): Payer: Medicare Other | Admitting: *Deleted

## 2014-02-20 ENCOUNTER — Ambulatory Visit (INDEPENDENT_AMBULATORY_CARE_PROVIDER_SITE_OTHER): Payer: Medicare Other | Admitting: Cardiology

## 2014-02-20 VITALS — BP 110/64 | HR 56 | Ht 71.0 in | Wt 167.2 lb

## 2014-02-20 DIAGNOSIS — Z954 Presence of other heart-valve replacement: Secondary | ICD-10-CM

## 2014-02-20 DIAGNOSIS — E7800 Pure hypercholesterolemia, unspecified: Secondary | ICD-10-CM

## 2014-02-20 DIAGNOSIS — E785 Hyperlipidemia, unspecified: Secondary | ICD-10-CM | POA: Diagnosis not present

## 2014-02-20 DIAGNOSIS — Z79899 Other long term (current) drug therapy: Secondary | ICD-10-CM

## 2014-02-20 DIAGNOSIS — Z952 Presence of prosthetic heart valve: Secondary | ICD-10-CM

## 2014-02-20 LAB — CBC WITH DIFFERENTIAL/PLATELET
Basophils Absolute: 0 10*3/uL (ref 0.0–0.1)
Basophils Relative: 0.4 % (ref 0.0–3.0)
EOS ABS: 0.2 10*3/uL (ref 0.0–0.7)
Eosinophils Relative: 2.1 % (ref 0.0–5.0)
HEMATOCRIT: 31.3 % — AB (ref 39.0–52.0)
Hemoglobin: 10.5 g/dL — ABNORMAL LOW (ref 13.0–17.0)
LYMPHS ABS: 1.3 10*3/uL (ref 0.7–4.0)
Lymphocytes Relative: 16.2 % (ref 12.0–46.0)
MCHC: 33.6 g/dL (ref 30.0–36.0)
MCV: 81.6 fl (ref 78.0–100.0)
MONOS PCT: 6.3 % (ref 3.0–12.0)
Monocytes Absolute: 0.5 10*3/uL (ref 0.1–1.0)
Neutro Abs: 6 10*3/uL (ref 1.4–7.7)
Neutrophils Relative %: 75 % (ref 43.0–77.0)
PLATELETS: 179 10*3/uL (ref 150.0–400.0)
RBC: 3.83 Mil/uL — ABNORMAL LOW (ref 4.22–5.81)
RDW: 16.1 % — AB (ref 11.5–15.5)
WBC: 8.1 10*3/uL (ref 4.0–10.5)

## 2014-02-20 LAB — BASIC METABOLIC PANEL
BUN: 23 mg/dL (ref 6–23)
CALCIUM: 8.8 mg/dL (ref 8.4–10.5)
CO2: 27 mEq/L (ref 19–32)
Chloride: 104 mEq/L (ref 96–112)
Creatinine, Ser: 0.89 mg/dL (ref 0.40–1.50)
GFR: 88.95 mL/min (ref >60.00)
GLUCOSE: 89 mg/dL (ref 70–99)
Potassium: 4.4 mEq/L (ref 3.5–5.1)
Sodium: 135 mEq/L (ref 135–145)

## 2014-02-20 LAB — HEPATIC FUNCTION PANEL
ALBUMIN: 3.8 g/dL (ref 3.5–5.2)
ALT: 34 U/L (ref 0–53)
AST: 25 U/L (ref 0–37)
Alkaline Phosphatase: 104 U/L (ref 39–117)
BILIRUBIN TOTAL: 0.4 mg/dL (ref 0.2–1.2)
Bilirubin, Direct: 0.1 mg/dL (ref 0.0–0.3)
TOTAL PROTEIN: 6.3 g/dL (ref 6.0–8.3)

## 2014-02-20 LAB — LIPID PANEL
CHOLESTEROL: 142 mg/dL (ref 0–200)
HDL: 65.6 mg/dL (ref >39.00)
LDL Cholesterol: 65 mg/dL (ref 0–99)
NonHDL: 76.4
Total CHOL/HDL Ratio: 2
Triglycerides: 57 mg/dL (ref 0.0–149.0)
VLDL: 11.4 mg/dL (ref 0.0–40.0)

## 2014-02-20 LAB — TSH: TSH: 6.02 u[IU]/mL — AB (ref 0.35–4.50)

## 2014-02-20 MED ORDER — ATORVASTATIN CALCIUM 20 MG PO TABS: 20.0000 mg | ORAL_TABLET | Freq: Every day | ORAL | Status: DC

## 2014-02-20 NOTE — Progress Notes (Signed)
Cardiology Office Note   Date:  02/20/2014   ID:  [name], DOB 24-Mar-1940, MRN [id number]  PCP:  Leonides Sake, MD  Cardiologist:   Darlin Coco, MD   No chief complaint on file.     History of Present Illness: [name] [name] is a 74 y.o. male who presents for scheduled follow-up office visit  This pleasant 74 year old gentleman is seen for a post hospital office visit. 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 His surgery was by Dr. Servando Snare in 02/09/03. At the time of his cardiac catheterization he did not have any significant coronary disease and did not require bypass surgery. He also has a history of hypercholesterolemia. He also has a history of BPH with nocturia and his PSA a year ago was 3.31, and a repeat PSA on 05/06/11 was elevated at 5.87. He was advised at that time to see a urologist but did not see one. He saw his PCP. The patient started eating pumpkinseed's and this has brought his PSA down to its current level of 3.2. Of note is the fact that he tells me that his father died of prostate cancer with bony metastases at age 15. The patient now has a urologist, Dr. [name]. After his last visit in October 2014 the patient underwent an chest x-ray which showed heart size at upper normal and lungs were clear. On 11/14/12 patient had an echocardiogram showing an ejection fraction of 60-65% with grade 2 diastolic dysfunction. His prosthetic valve showed moderate aortic stenosis with peak gradient 55 and a mean gradient of 22. He also has moderate aortic insufficiency.  Several months ago patient developed symptoms of increased dyspnea. He has developed orthopnea. This began in October 2015. 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 His left ventricle was dilated and his ejection fraction had fallen  to 30-35%. He underwent repeat cardiac catheterization and then underwent a redo aortic valve replacement with a pericardial tissue valve as well as two-vessel coronary artery bypass graft surgery by Dr. Servando Snare on 12/27/13. The patient had postoperative atrial fibrillation. He was discharged home on amiodarone. He is now back in normal sinus rhythm.  He continues to have a lot of problems with his bladder.  He is having to self catheterize him self each evening.  He is now taking 2 Flomax tablets at bedtime. He has been complaining of poor balance.  This may be from the side effects of amiodarone. He would like to enter the cardiac rehabilitation program and he would like to do it in [place].  We will make the appropriate referrals. He has not been having any chest pain or shortness of breath.  Past Medical History  Diagnosis Date  . Aortic stenosis   . Hyperlipidemia   . Heart murmur   . Sleep apnea     mild to moderate  . Glaucoma     uses eye drops daily  . CHF (congestive heart failure)     takes Furosemide daily    Past Surgical History  Procedure Laterality Date  . Cardiac catheterization  02/08/2003    severe aortic stenosis,normal left ventricular function  . 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 Inguinal hernia repair  04/2003  . Left and right heart catheterization with coronary angiogram N/A 12/18/2013  Procedure: LEFT AND RIGHT HEART CATHETERIZATION WITH CORONARY ANGIOGRAM;  Surgeon: Peter M Martinique, MD;  Location: [facility] CATH LAB;  Service: Cardiovascular;  Laterality: N/A;  . Aortic valve replacement N/A 12/27/2013    Procedure: REDO AORTIC VALVE REPLACEMENT WITH 23MM MAGNA EASE PERICARDIAL TISSUE VALVE;  Surgeon: Grace Isaac, MD;  Location: [place];  Service: Open Heart Surgery;  Laterality: N/A;  . Tee without cardioversion N/A 12/27/2013    Procedure: TRANSESOPHAGEAL ECHOCARDIOGRAM (TEE);  Surgeon: Grace Isaac, MD;   Location: [facility];  Service: Open Heart Surgery;  Laterality: N/A;  . Coronary artery bypass graft N/A 12/27/2013    Procedure: CORONARY ARTERY BYPASS GRAFTING TIMES TWO USING RIGHT LEG SAPHENOUS VEIN TO RIGHT CORONARY ARTERY AND LEFT INTERNAL MAMMARY ARTERY TO LEFT ANTERIOR DESCENDING ARTERY;  Surgeon: Grace Isaac, MD;  Location: [facility];  Service: Open Heart Surgery;  Laterality: N/A;     Current Outpatient Prescriptions  Medication Sig Dispense Refill  . amoxicillin (AMOXIL) 500 MG tablet 4 tablets 1 hour prior to procedure 20 tablet 1  . aspirin EC 325 MG EC tablet Take 1 tablet (325 mg total) by mouth daily. 30 tablet 0  . atorvastatin (LIPITOR) 20 MG tablet Take 1 tablet (20 mg total) by mouth daily at 6 PM. 30 tablet 11  . brimonidine (ALPHAGAN) 0.15 % ophthalmic solution Place 3 drops into both eyes daily.     Marland Kitchen co-enzyme Q-10 30 MG capsule Take 30 mg by mouth daily.    Marland Kitchen latanoprost (XALATAN) 0.005 % ophthalmic solution Place 1 drop into both eyes at bedtime.     Marland Kitchen lisinopril (PRINIVIL,ZESTRIL) 5 MG tablet Take 1 tablet by mouth daily.    . Probiotic Product (PROBIOTIC DAILY PO) Take 1 tablet by mouth daily.    . tamsulosin (FLOMAX) 0.4 MG CAPS capsule Take 0.4 mg by mouth 2 (two) times daily.      No current facility-administered medications for this visit.    Allergies:   Oxycodone    Social History:  The patient  reports that he has never smoked. He has never used smokeless tobacco. He reports that he does not drink alcohol or use illicit drugs.   Family History:  The patient's family history includes Cancer in his father; Heart disease in his mother.    ROS:  Please see the history of present illness.   Otherwise, review of systems are positive for none.   All other systems are reviewed and negative.    PHYSICAL EXAM: VS:  BP 110/64 mmHg  Pulse 56  Ht 5\' 11"  (1.803 m)  Wt 167 lb 3.2 oz (75.841 kg)  BMI 23.33 kg/m2 , BMI Body mass index is 23.33 kg/(m^2). GEN:  Well nourished, well developed, in no acute distress HEENT: normal Neck: no JVD, carotid bruits, or masses Cardiac: RRR; there is a soft systolic murmur at the aortic area.  No diastolic murmur.  No rubs, or gallops,no edema  Respiratory:  clear to auscultation bilaterally, normal work of breathing GI: soft, nontender, nondistended, + BS MS: no deformity or atrophy Skin: warm and dry, no rash Neuro:  Strength and sensation are intact Psych: euthymic mood, full affect   EKG:  EKG is not ordered today.    Recent Labs: 12/28/2013: Magnesium 2.3 02/20/2014: ALT 34; BUN 23; Creatinine 0.89; Hemoglobin 10.5*; Platelets 179.0; Potassium 4.4; Sodium 135; TSH 6.02*    Lipid Panel    Component Value Date/Time   CHOL 142 02/20/2014 1034   TRIG  57.0 02/20/2014 1034   HDL 65.60 02/20/2014 1034   CHOLHDL 2 02/20/2014 1034   VLDL 11.4 02/20/2014 1034   LDLCALC 65 02/20/2014 1034      Wt Readings from Last 3 Encounters:  02/20/14 167 lb 3.2 oz (75.841 kg)  02/08/14 165 lb (74.844 kg)  01/22/14 163 lb (73.936 kg)     ASSESSMENT AND PLAN:  1. 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 He also underwent CABG 2. The date of his surgery was 12/27/13 3. 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 history of sleep apnea followed by Dr. Baird Lyons 5. past history of elevated PSA. Patient has symptoms of urinary tract infection with frequency and nocturia. He now has a urologist, Dr. [name].  He has urinary retention and is self catheterizing himself nightly. 6. Postoperative atrial fibrillation, now back in sinus rhythm on amiodarone   Current medicines are reviewed at length with the patient today.  The patient has concerns regarding medicines.  The following changes have been made:  We are stopping his amiodarone  at this time.  He has remained in normal sinus rhythm.  He complains of poor balance which may be secondary in part to the amiodarone. He wonders about taking coenzyme Q10 since he is on a statin drug.  This will be okay to add coenzyme Q10.    Orders Placed This Encounter  Procedures  . CBC with Differential/Platelet  . Lipid panel  . Hepatic function panel  . Basic metabolic panel  . TSH  . EKG 12-Lead  . 2D Echocardiogram without contrast     Disposition:   FU with Dr. Mare Ferrari in 6 weeks for follow-up office visit and EKG.  We will plan to get a 2-D echocardiogram to evaluate his aortic valve prosthesis prior to his office visit. We will refer to the cardiac rehabilitation program in [name]. We are stopping his amiodarone now.   Signed, Darlin Coco, MD  02/20/2014 5:28 PM    [address], [place],   [address] Phone: [phone]; Fax: [phone]

## 2014-02-20 NOTE — Patient Instructions (Addendum)
Will obtain labs today and call you with the results (lp/bmet/hfp/cbc/tsh)  STOP AMIODARONE  START Coq10 1 daily  Will arrange cardiac rehab in [place] has requested that you have an echocardiogram. Echocardiography is a painless test that uses sound waves to create images of your heart. It provides your doctor with information about the size and shape of your heart and how well your heart's chambers and valves are working. This procedure takes approximately one hour. There are no restrictions for this procedure. [facility]  Your physician recommends that you schedule a follow-up appointment in: LATE MARCH/ekg

## 2014-02-21 NOTE — Progress Notes (Signed)
Quick Note:  Please report to patient. The recent labs are stable. Continue same medication and careful diet. The anemia is improving. The lipids are very good. Liver tests are normal. For the anemia add a multivitamin with iron if not already taking. ______

## 2014-02-22 ENCOUNTER — Ambulatory Visit: Payer: Medicare Other | Admitting: Cardiology

## 2014-02-22 ENCOUNTER — Other Ambulatory Visit: Payer: Medicare Other

## 2014-02-23 ENCOUNTER — Other Ambulatory Visit: Payer: Medicare Other

## 2014-02-23 ENCOUNTER — Ambulatory Visit: Payer: Medicare Other | Admitting: Cardiology

## 2014-03-12 ENCOUNTER — Telehealth: Payer: Self-pay | Admitting: Cardiology

## 2014-03-12 NOTE — Telephone Encounter (Signed)
Spoke with wife and they did receive a call from Cardiac Rehab in [place] Patient has opted out of rehab secondary to cost ($50 each visit at 3 x a week is more than they can afford) Will forward to  Dr. Mare Ferrari so he will be aware

## 2014-03-12 NOTE — Telephone Encounter (Signed)
New message     Returning Melinda's call from friday

## 2014-03-13 NOTE — Telephone Encounter (Signed)
Follow up  Wife returned call.

## 2014-03-13 NOTE — Telephone Encounter (Signed)
Okay.  Just exercise on his own then.

## 2014-03-13 NOTE — Telephone Encounter (Signed)
Advised wife  

## 2014-03-13 NOTE — Telephone Encounter (Signed)
Left message to call back  

## 2014-03-15 DIAGNOSIS — H401233 Low-tension glaucoma, bilateral, severe stage: Secondary | ICD-10-CM | POA: Diagnosis not present

## 2014-03-27 ENCOUNTER — Telehealth: Payer: Self-pay | Admitting: Cardiology

## 2014-03-27 NOTE — Telephone Encounter (Signed)
New Msg         Pt wife calling, requests call back no info given.

## 2014-03-27 NOTE — Telephone Encounter (Signed)
Patient has stopped statin secondary to hip pain, seems to improving Will discuss further with  Dr. Mare Ferrari at [facility] next week

## 2014-03-29 ENCOUNTER — Ambulatory Visit (HOSPITAL_COMMUNITY): Payer: Medicare Other | Attending: Cardiology | Admitting: Radiology

## 2014-03-29 DIAGNOSIS — I359 Nonrheumatic aortic valve disorder, unspecified: Secondary | ICD-10-CM

## 2014-03-29 DIAGNOSIS — Z952 Presence of prosthetic heart valve: Secondary | ICD-10-CM

## 2014-03-29 DIAGNOSIS — Z954 Presence of other heart-valve replacement: Secondary | ICD-10-CM | POA: Diagnosis not present

## 2014-03-29 NOTE — Progress Notes (Signed)
Echocardiogram performed.  

## 2014-04-05 ENCOUNTER — Ambulatory Visit (INDEPENDENT_AMBULATORY_CARE_PROVIDER_SITE_OTHER): Payer: Medicare Other | Admitting: Cardiology

## 2014-04-05 ENCOUNTER — Encounter: Payer: Self-pay | Admitting: Cardiology

## 2014-04-05 VITALS — BP 130/80 | HR 57 | Ht 71.0 in | Wt 168.1 lb

## 2014-04-05 DIAGNOSIS — Z951 Presence of aortocoronary bypass graft: Secondary | ICD-10-CM

## 2014-04-05 DIAGNOSIS — Z954 Presence of other heart-valve replacement: Secondary | ICD-10-CM

## 2014-04-05 DIAGNOSIS — E7800 Pure hypercholesterolemia, unspecified: Secondary | ICD-10-CM

## 2014-04-05 DIAGNOSIS — Z952 Presence of prosthetic heart valve: Secondary | ICD-10-CM

## 2014-04-05 MED ORDER — ATORVASTATIN CALCIUM 10 MG PO TABS: 10.0000 mg | ORAL_TABLET | Freq: Every day | ORAL | Status: DC

## 2014-04-05 NOTE — Progress Notes (Signed)
Cardiology Office Note   Date:  04/05/2014   ID:  [name] [name], DOB 1940/06/24, MRN [id number]  PCP:  Leonides Sake, MD  Cardiologist:   Darlin Coco, MD   No chief complaint on file.     History of Present Illness: [name] [name] is a 74 y.o. male who presents for follow-up office visit  This pleasant 74 year old gentleman is seen for a scheduled office visit. 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 His surgery was by Dr. Servando Snare in 02/09/03. At the time of his cardiac catheterization he did not have any significant coronary disease and did not require bypass surgery. He also has a history of hypercholesterolemia. He also has a history of BPH with nocturia and his PSA a year ago was 3.31, and a repeat PSA on 05/06/11 was elevated at 5.87. He was advised at that time to see a urologist but did not see one. He saw his PCP. The patient started eating pumpkinseed's and this has brought his PSA down to its current level of 3.2. Of note is the fact that he tells me that his father died of prostate cancer with bony metastases at age 43. The patient now has a urologist, Dr. [name]. After his last visit in October 2014 the patient underwent an chest x-ray which showed heart size at upper normal and lungs were clear. On 11/14/12 patient had an echocardiogram showing an ejection fraction of 60-65% with grade 2 diastolic dysfunction. His prosthetic valve showed moderate aortic stenosis with peak gradient 55 and a mean gradient of 22. He also has moderate aortic insufficiency.  Several months ago patient developed symptoms of increased dyspnea. He has developed orthopnea. This began in October 2015. 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 His left ventricle was dilated and his ejection fraction had fallen to 30-35%. He  underwent repeat cardiac catheterization and then underwent a redo aortic valve replacement with a pericardial tissue valve as well as two-vessel coronary artery bypass graft surgery by Dr. Servando Snare on 12/27/13. The patient had postoperative atrial fibrillation. He was discharged home on amiodarone. At his last office visit his amiodarone was stopped because of possible dizziness as a side effect.  The patient has remained in normal sinus rhythm off amiodarone. He expressed a previous interest in the cardiac rehabilitation program.  However the co-pay was too expensive and he decided not to participate.  He is walking on his own for exercise. He had a follow-up postoperative echocardiogram on 03/29/14 which showed normal left ventricular systolic function with an ejection fraction of 60-65% and his bioprosthetic aortic valve is functioning normally. The patient continues to have problems with urinary retention and is self catheterizing himself once a day each evening.  Dr. [name] is his urologist  Past Medical History  Diagnosis Date  . Aortic stenosis   . Hyperlipidemia   . Heart murmur   . Sleep apnea     mild to moderate  . Glaucoma     uses eye drops daily  . CHF (congestive heart failure)     takes Furosemide daily    Past Surgical History  Procedure Laterality Date  . Cardiac catheterization  02/08/2003    severe aortic stenosis,normal left ventricular function  . 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  Inguinal hernia repair  04/2003  . Left and right heart catheterization with coronary angiogram N/A 12/18/2013    Procedure: LEFT AND RIGHT HEART CATHETERIZATION WITH CORONARY ANGIOGRAM;  Surgeon: Peter M Martinique, MD;  Location: [facility] CATH LAB;  Service: Cardiovascular;  Laterality: N/A;  . Aortic valve replacement N/A 12/27/2013    Procedure: REDO AORTIC VALVE REPLACEMENT WITH 23MM MAGNA EASE PERICARDIAL TISSUE VALVE;  Surgeon: Grace Isaac, MD;   Location: [place];  Service: Open Heart Surgery;  Laterality: N/A;  . Tee without cardioversion N/A 12/27/2013    Procedure: TRANSESOPHAGEAL ECHOCARDIOGRAM (TEE);  Surgeon: Grace Isaac, MD;  Location: [facility];  Service: Open Heart Surgery;  Laterality: N/A;  . Coronary artery bypass graft N/A 12/27/2013    Procedure: CORONARY ARTERY BYPASS GRAFTING TIMES TWO USING RIGHT LEG SAPHENOUS VEIN TO RIGHT CORONARY ARTERY AND LEFT INTERNAL MAMMARY ARTERY TO LEFT ANTERIOR DESCENDING ARTERY;  Surgeon: Grace Isaac, MD;  Location: [facility];  Service: Open Heart Surgery;  Laterality: N/A;     Current Outpatient Prescriptions  Medication Sig Dispense Refill  . amoxicillin (AMOXIL) 500 MG tablet 4 tablets 1 hour prior to procedure 20 tablet 1  . aspirin EC 325 MG EC tablet Take 1 tablet (325 mg total) by mouth daily. 30 tablet 0  . brimonidine (ALPHAGAN) 0.15 % ophthalmic solution Place 3 drops into both eyes daily.     Marland Kitchen latanoprost (XALATAN) 0.005 % ophthalmic solution Place 1 drop into both eyes at bedtime.     Marland Kitchen lisinopril (PRINIVIL,ZESTRIL) 5 MG tablet Take 1 tablet by mouth daily.    . Probiotic Product (PROBIOTIC DAILY PO) Take 1 tablet by mouth daily.    . tamsulosin (FLOMAX) 0.4 MG CAPS capsule Take 0.4 mg by mouth 2 (two) times daily.     Marland Kitchen atorvastatin (LIPITOR) 10 MG tablet Take 1 tablet (10 mg total) by mouth daily at 6 PM. 30 tablet 5   No current facility-administered medications for this visit.    Allergies:   Oxycodone    Social History:  The patient  reports that he has never smoked. He has never used smokeless tobacco. He reports that he does not drink alcohol or use illicit drugs.   Family History:  The patient's family history includes Cancer in his father; Heart disease in his mother.    ROS:  Please see the history of present illness.   Otherwise, review of systems are positive for none.   All other systems are reviewed and negative.    PHYSICAL EXAM: VS:  BP 130/80 mmHg   Pulse 57  Ht 5\' 11"  (1.803 m)  Wt 168 lb 1.9 oz (76.259 kg)  BMI 23.46 kg/m2 , BMI Body mass index is 23.46 kg/(m^2). GEN: Well nourished, well developed, in no acute distress HEENT: normal Neck: no JVD, carotid bruits, or masses Cardiac: RRR; no murmurs, rubs, or gallops,no edema .  Soft flow murmur across the aortic prosthetic valve.  No diastolic murmur. Respiratory:  clear to auscultation bilaterally, normal work of breathing GI: soft, nontender, nondistended, + BS MS: no deformity or atrophy Skin: warm and dry, no rash Neuro:  Strength and sensation are intact Psych: euthymic mood, full affect   EKG:  EKG is not ordered today.    Recent Labs: 12/28/2013: Magnesium 2.3 02/20/2014: ALT 34; BUN 23; Creatinine 0.89; Hemoglobin 10.5*; Platelets 179.0; Potassium 4.4; Sodium 135; TSH 6.02*    Lipid Panel    Component Value Date/Time   CHOL 142 02/20/2014  1034   TRIG 57.0 02/20/2014 1034   HDL 65.60 02/20/2014 1034   CHOLHDL 2 02/20/2014 1034   VLDL 11.4 02/20/2014 1034   LDLCALC 65 02/20/2014 1034      Wt Readings from Last 3 Encounters:  04/05/14 168 lb 1.9 oz (76.259 kg)  02/20/14 167 lb 3.2 oz (75.841 kg)  02/08/14 165 lb (74.844 kg)          ASSESSMENT AND PLAN:  1. 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 He also underwent CABG 2. The date of his surgery was 12/27/13 3. 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 history of sleep apnea followed by Dr. Baird Lyons 5. past history of elevated PSA. Patient has symptoms of urinary tract infection with frequency and nocturia. He now has a urologist, Dr. [name]. He has urinary retention and is self catheterizing himself nightly. 6. Postoperative atrial fibrillation, now back in sinus rhythm off amiodarone                  Current medicines are reviewed at length with the patient today.  The patient does not have concerns regarding medicines.  The following changes have been made:  no change.  We are restarting his Lipitor in a lower dose of 10 mg daily  Labs/ tests ordered today include:   Orders Placed This Encounter  Procedures  . Lipid panel  . Basic metabolic panel  . Hepatic function panel  . EKG 12-Lead     Recheck in 3 months for office visit EKG lipid panel hepatic function panel and basal metabolic panel.  Signed, Darlin Coco, MD  04/05/2014 5:19 PM    [address] [place], [place],   [address] Phone: [phone]; Fax: [phone]

## 2014-04-05 NOTE — Patient Instructions (Signed)
RESTART LIPITOR AT 10 MG DAILY  Your physician recommends that you schedule a follow-up appointment in: 3 months with fasting labs (LP/BMET/HFP) AND EKG

## 2014-04-11 DIAGNOSIS — R339 Retention of urine, unspecified: Secondary | ICD-10-CM | POA: Diagnosis not present

## 2014-04-24 ENCOUNTER — Other Ambulatory Visit: Payer: Self-pay

## 2014-04-24 DIAGNOSIS — R911 Solitary pulmonary nodule: Secondary | ICD-10-CM

## 2014-05-10 DIAGNOSIS — N390 Urinary tract infection, site not specified: Secondary | ICD-10-CM | POA: Diagnosis not present

## 2014-05-10 DIAGNOSIS — Z6823 Body mass index (BMI) 23.0-23.9, adult: Secondary | ICD-10-CM | POA: Diagnosis not present

## 2014-05-11 DIAGNOSIS — R339 Retention of urine, unspecified: Secondary | ICD-10-CM | POA: Diagnosis not present

## 2014-06-07 ENCOUNTER — Ambulatory Visit (INDEPENDENT_AMBULATORY_CARE_PROVIDER_SITE_OTHER): Payer: Medicare Other | Admitting: Cardiothoracic Surgery

## 2014-06-07 ENCOUNTER — Encounter: Payer: Self-pay | Admitting: Cardiothoracic Surgery

## 2014-06-07 ENCOUNTER — Ambulatory Visit
Admission: RE | Admit: 2014-06-07 | Discharge: 2014-06-07 | Disposition: A | Discharge status: Home / Self Care | Payer: Medicare Other | Source: Ambulatory Visit | Attending: Cardiothoracic Surgery | Admitting: Cardiothoracic Surgery

## 2014-06-07 VITALS — BP 113/62 | HR 52 | Resp 20 | Ht 71.0 in | Wt 167.0 lb

## 2014-06-07 DIAGNOSIS — R911 Solitary pulmonary nodule: Secondary | ICD-10-CM

## 2014-06-07 DIAGNOSIS — J8410 Pulmonary fibrosis, unspecified: Secondary | ICD-10-CM | POA: Diagnosis not present

## 2014-06-07 DIAGNOSIS — I2510 Atherosclerotic heart disease of native coronary artery without angina pectoris: Secondary | ICD-10-CM | POA: Diagnosis not present

## 2014-06-07 DIAGNOSIS — Z951 Presence of aortocoronary bypass graft: Secondary | ICD-10-CM

## 2014-06-07 DIAGNOSIS — Z954 Presence of other heart-valve replacement: Secondary | ICD-10-CM | POA: Diagnosis not present

## 2014-06-07 DIAGNOSIS — Z952 Presence of prosthetic heart valve: Secondary | ICD-10-CM

## 2014-06-07 NOTE — Progress Notes (Signed)
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 SURGICAL PROCEDURE: Redo aortic valve replacement with a #23 [place] pericardial tissue valve, model 3300TFX, serial [id number] and coronary artery bypass grafting x2 with the left internal mammary to the left anterior descending coronary artery and reverse saphenous vein graft to the distal right coronary artery with right thigh EndoVein harvesting. SURGEON: Lanelle Bal, MD  History of Present Illness:     Patient is doing well postoperatively. He is returned to his usual activities without evidence of congestive heart failure or angina. The exertional shortness of breath which he previously had prior to valve replacement has resolved.  At the time of his preop CT scan of the chest evaluate the size of his aorta a 7 mm right lung nodule was appreciated and returns today with a follow-up CT scan.   Past Medical History  Diagnosis Date  . Aortic stenosis   . Hyperlipidemia   . Heart murmur   . Sleep apnea     mild to moderate  . Glaucoma     uses eye drops daily  . CHF (congestive heart failure)     takes Furosemide daily     History  Smoking status  . Never Smoker   Smokeless tobacco  . Never Used    History  Alcohol Use No     Allergies  Allergen Reactions  . Oxycodone Other (See Comments)    Hallucinations    Current Outpatient Prescriptions  Medication Sig Dispense Refill  . amoxicillin  (AMOXIL) 500 MG tablet 4 tablets 1 hour prior to procedure 20 tablet 1  . aspirin EC 325 MG EC tablet Take 1 tablet (325 mg total) by mouth daily. 30 tablet 0  . atorvastatin (LIPITOR) 10 MG tablet Take 1 tablet (10 mg total) by mouth daily at 6 PM. 30 tablet 5  . brimonidine (ALPHAGAN) 0.15 % ophthalmic solution Place 3 drops into both eyes daily.     Marland Kitchen latanoprost (XALATAN) 0.005 % ophthalmic solution Place 1 drop into both eyes at bedtime.     Marland Kitchen lisinopril (PRINIVIL,ZESTRIL) 5 MG tablet Take 1 tablet by mouth daily.    . Probiotic Product (PROBIOTIC DAILY PO) Take 1 tablet by mouth daily.    . tamsulosin (FLOMAX) 0.4 MG CAPS capsule Take 0.4 mg by mouth 2 (two) times daily.      No current facility-administered medications for this visit.       Physical Exam: There were no vitals taken for this visit.  General appearance: alert and cooperative Neurologic: intact Heart: regular rate and rhythm, S1, S2 normal, no murmur, click, rub or gallop Lungs: clear to auscultation bilaterally Abdomen: soft, non-tender; bowel sounds normal; no masses,  no organomegaly Extremities: extremities normal, atraumatic, no cyanosis or edema, Homans sign is negative, no sign of DVT and no edema, redness or tenderness in the calves or thighs Wound: Patient sternum is stable and well healed Sternum is stable and well healed Diagnostic  Studies & Laboratory data:     Recent Radiology Findings:  Ct Chest Wo Contrast  06/07/2014   CLINICAL DATA:  Followup pulmonary nodule  EXAM: CT CHEST WITHOUT CONTRAST  TECHNIQUE: Multidetector CT imaging of the chest was performed following the standard protocol without IV contrast.  COMPARISON:  Chest CT 12/25/2013  FINDINGS: Chest wall: No chest wall mass, supraclavicular or axillary lymphadenopathy. The thyroid gland is grossly normal. The bony thorax is intact. No destructive bone lesions or spinal canal compromise. Stable surgical changes from cardiac surgery with median  sternotomy wires and a prosthetic aortic valve.  Mediastinum: The heart is normal in size. No pericardial effusion. No mediastinal or hilar mass or adenopathy. The esophagus is grossly normal. Stable tortuosity and calcification involving the thoracic aorta. Stable dense 3 vessel coronary artery calcifications.  Lungs/ pleura: Stable small calcified granulomas. The 7 mm ground-glass nodule in the right lower lobe has resolved. No new lesions. No acute findings. No pleural effusion.  Upper abdomen:  No significant findings.  Stable gallstones.  IMPRESSION: 1. Resolution of 7 mm ground-glass nodule in the right lower lobe. 2. Stable small calcified granulomas. 3. No new or worrisome pulmonary lesions and no acute pulmonary findings. 4. No mediastinal or hilar mass or adenopathy. 5. Stable surgical changes from aortic valve replacement surgery. 6. Stable extensive coronary artery calcifications.   Electronically Signed   By: Marijo Sanes M.D.   On: 06/07/2014 14:00     I have independently reviewed the above radiology studies  and reviewed the findings with the patient.    Recent Lab Findings: Lab Results  Component Value Date   WBC 8.1 02/20/2014   HGB 10.5* 02/20/2014   HCT 31.3* 02/20/2014   PLT 179.0 02/20/2014   GLUCOSE 89 02/20/2014   CHOL 142 02/20/2014   TRIG 57.0 02/20/2014   HDL 65.60 02/20/2014   LDLCALC 65 02/20/2014   ALT 34 02/20/2014   AST 25 02/20/2014   NA 135 02/20/2014   K 4.4 02/20/2014   CL 104 02/20/2014   CREATININE 0.89 02/20/2014   BUN 23 02/20/2014   CO2 27 02/20/2014   TSH 6.02* 02/20/2014   INR 1.02 12/26/2013   HGBA1C 5.4 12/26/2013      Assessment / Plan:       aortic valve disease status post bioprosthetic aortic valve replacement 02/06/03 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 He also underwent CABG 2. The date of his surgery was 12/27/13 past history of elevated PSA. Patient now followed by urology treated his acute urinary  retention and urinary tract infection . He continues to have bladder emptying problems and has to catheterize himself once a day He now has a urologist, Dr. [name].  Patient has 7 mm right lung nodule on preoperative CT scan, . The patient is a nonsmoker but does have a previous history of exposure to asbestos. Follow-up scan done today shows complete resolution of the right lung nodule noted on his preop CT scan of the chest. I reviewed with patient the CT scan the fact that these nodule has now completely resolved and does not need further CT scan follow-up.  With the patient's prosthetic heart valve the risks of endocarditis have been discussed. The recommendations for periprocedural antibiotics including dental procedures and other procedures have been discussed with the patient.   Grace Isaac MD      [address] [place] [address] Office [phone]   [place] [phone]  06/07/2014 1:29 PM

## 2014-06-12 DIAGNOSIS — R339 Retention of urine, unspecified: Secondary | ICD-10-CM | POA: Diagnosis not present

## 2014-06-25 ENCOUNTER — Other Ambulatory Visit: Payer: Medicare Other

## 2014-07-09 ENCOUNTER — Other Ambulatory Visit: Payer: Self-pay

## 2014-07-13 ENCOUNTER — Encounter: Payer: Self-pay | Admitting: Cardiology

## 2014-07-13 ENCOUNTER — Ambulatory Visit (INDEPENDENT_AMBULATORY_CARE_PROVIDER_SITE_OTHER): Payer: Medicare Other | Admitting: Cardiology

## 2014-07-13 ENCOUNTER — Other Ambulatory Visit (INDEPENDENT_AMBULATORY_CARE_PROVIDER_SITE_OTHER): Payer: Medicare Other | Admitting: *Deleted

## 2014-07-13 VITALS — BP 124/82 | HR 62 | Ht 71.0 in | Wt 163.0 lb

## 2014-07-13 DIAGNOSIS — M79604 Pain in right leg: Secondary | ICD-10-CM | POA: Diagnosis not present

## 2014-07-13 DIAGNOSIS — D649 Anemia, unspecified: Secondary | ICD-10-CM | POA: Diagnosis not present

## 2014-07-13 DIAGNOSIS — Z952 Presence of prosthetic heart valve: Secondary | ICD-10-CM

## 2014-07-13 DIAGNOSIS — E7800 Pure hypercholesterolemia, unspecified: Secondary | ICD-10-CM

## 2014-07-13 DIAGNOSIS — Z954 Presence of other heart-valve replacement: Secondary | ICD-10-CM

## 2014-07-13 DIAGNOSIS — Z951 Presence of aortocoronary bypass graft: Secondary | ICD-10-CM | POA: Diagnosis not present

## 2014-07-13 LAB — BASIC METABOLIC PANEL
BUN: 15 mg/dL (ref 6–23)
CO2: 27 meq/L (ref 19–32)
CREATININE: 0.74 mg/dL (ref 0.40–1.50)
Calcium: 9.1 mg/dL (ref 8.4–10.5)
Chloride: 103 mEq/L (ref 96–112)
GFR: 109.95 mL/min (ref >60.00)
Glucose, Bld: 85 mg/dL (ref 70–99)
POTASSIUM: 4.1 meq/L (ref 3.5–5.1)
Sodium: 136 mEq/L (ref 135–145)

## 2014-07-13 LAB — CBC WITH DIFFERENTIAL/PLATELET
BASOS ABS: 0 10*3/uL (ref 0.0–0.1)
Basophils Relative: 0.4 % (ref 0.0–3.0)
EOS PCT: 0.8 % (ref 0.0–5.0)
Eosinophils Absolute: 0 10*3/uL (ref 0.0–0.7)
HCT: 38.3 % — ABNORMAL LOW (ref 39.0–52.0)
Hemoglobin: 12.6 g/dL — ABNORMAL LOW (ref 13.0–17.0)
Lymphocytes Relative: 26.7 % (ref 12.0–46.0)
Lymphs Abs: 1.7 10*3/uL (ref 0.7–4.0)
MCHC: 33 g/dL (ref 30.0–36.0)
MCV: 85.9 fl (ref 78.0–100.0)
MONO ABS: 0.4 10*3/uL (ref 0.1–1.0)
Monocytes Relative: 6.9 % (ref 3.0–12.0)
NEUTROS PCT: 65.2 % (ref 43.0–77.0)
Neutro Abs: 4.2 10*3/uL (ref 1.4–7.7)
PLATELETS: 188 10*3/uL (ref 150.0–400.0)
RBC: 4.45 Mil/uL (ref 4.22–5.81)
RDW: 15.3 % (ref 11.5–15.5)
WBC: 6.5 10*3/uL (ref 4.0–10.5)

## 2014-07-13 LAB — LIPID PANEL
CHOLESTEROL: 187 mg/dL (ref 0–200)
HDL: 61.3 mg/dL (ref >39.00)
LDL Cholesterol: 117 mg/dL — ABNORMAL HIGH (ref 0–99)
NONHDL: 125.7
Total CHOL/HDL Ratio: 3
Triglycerides: 44 mg/dL (ref 0.0–149.0)
VLDL: 8.8 mg/dL (ref 0.0–40.0)

## 2014-07-13 LAB — HEPATIC FUNCTION PANEL
ALBUMIN: 3.9 g/dL (ref 3.5–5.2)
ALK PHOS: 90 U/L (ref 39–117)
ALT: 17 U/L (ref 0–53)
AST: 15 U/L (ref 0–37)
Bilirubin, Direct: 0.1 mg/dL (ref 0.0–0.3)
Total Bilirubin: 0.6 mg/dL (ref 0.2–1.2)
Total Protein: 6.6 g/dL (ref 6.0–8.3)

## 2014-07-13 NOTE — Patient Instructions (Signed)
Medication Instructions:  Your physician recommends that you continue on your current medications as directed. Please refer to the Current Medication list given to you today.  Labwork: Lp/bmet/hfp/cbc  Testing/Procedures: none3  Follow-Up: Your physician wants you to follow-up in: 4 month ov You will receive a reminder letter in the mail two months in advance. If you don't receive a letter, please call our office to schedule the follow-up appointment.

## 2014-07-13 NOTE — Progress Notes (Signed)
Cardiology Office Note   Date:  07/13/2014   ID:  [name] [name], DOB 09-Sep-1940, MRN [id number]  PCP:  Leonides Sake, MD  Cardiologist: Darlin Coco MD  No chief complaint on file.     History of Present Illness: [name] [name] is a 74 y.o. male who presents for a four-month follow-up office visit.  Marland Kitchen He has a history of previous severe aortic stenosis and has had an aortic valve replacement with a tissue valve. His surgery was by Dr. Servando Snare in 02/09/03. At the time of his cardiac catheterization he did not have any significant coronary disease and did not require bypass surgery. He also has a history of hypercholesterolemia. .  Several months ago patient developed symptoms of increased dyspnea and orthopnea. This began in October 2015. 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 His left ventricle was dilated and his ejection fraction had fallen to 30-35%. He underwent repeat cardiac catheterization and then underwent a redo aortic valve replacement with a pericardial tissue valve as well as two-vessel coronary artery bypass graft surgery by Dr. Servando Snare on 12/27/13. The patient had postoperative atrial fibrillation. He was discharged home on amiodarone which was subsequently stopped and he has maintained normal sinus rhythm He had a follow-up postoperative echocardiogram on 03/29/14 which showed normal left ventricular systolic function with an ejection fraction of 60-65% and his bioprosthetic aortic valve is functioning normally. The patient continues to have problems with BPH and elevated PSA levels and a history of urinary retention and is self catheterizing himself once a day each evening. Dr. [name] is his urologist. Since last visit the patient has had some discomfort in the right leg.  It seems to start in the right groin area and radiates down the front part of his leg.   It is worse at night.  It began in March or April 2016.  He had fallen off a wagon several months earlier but did not have any immediate symptoms. Patient has hypercholesterolemia.  We had prescribed low-dose Lipitor last time which he never had filled.  We are rechecking labs today.  Past Medical History  Diagnosis Date  . Aortic stenosis   . Hyperlipidemia   . Heart murmur   . Sleep apnea     mild to moderate  . Glaucoma     uses eye drops daily  . CHF (congestive heart failure)     takes Furosemide daily    Past Surgical History  Procedure Laterality Date  . Cardiac catheterization  02/08/2003    severe aortic stenosis,normal left ventricular function  . 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 Inguinal hernia repair  04/2003  . Left and right heart catheterization with coronary angiogram N/A 12/18/2013    Procedure: LEFT AND RIGHT HEART CATHETERIZATION WITH CORONARY ANGIOGRAM;  Surgeon: Peter M Martinique, MD;  Location: [facility] CATH LAB;  Service: Cardiovascular;  Laterality: N/A;  . Aortic valve replacement N/A 12/27/2013    Procedure: REDO AORTIC VALVE REPLACEMENT WITH 23MM MAGNA EASE PERICARDIAL TISSUE VALVE;  Surgeon: Grace Isaac, MD;  Location: [facility];  Service: Open Heart Surgery;  Laterality: N/A;  . Tee without cardioversion N/A 12/27/2013    Procedure: TRANSESOPHAGEAL ECHOCARDIOGRAM (TEE);  Surgeon: Grace Isaac, MD;  Location: [facility];  Service: Open Heart Surgery;  Laterality: N/A;  . Coronary artery bypass graft N/A 12/27/2013  Procedure: CORONARY ARTERY BYPASS GRAFTING TIMES TWO USING RIGHT LEG SAPHENOUS VEIN TO RIGHT CORONARY ARTERY AND LEFT INTERNAL MAMMARY ARTERY TO LEFT ANTERIOR DESCENDING ARTERY;  Surgeon: Grace Isaac, MD;  Location: [facility];  Service: Open Heart Surgery;  Laterality: N/A;     Current Outpatient Prescriptions  Medication Sig Dispense Refill  . amoxicillin (AMOXIL) 500 MG tablet 4 tablets 1 hour prior  to procedure 20 tablet 1  . aspirin 325 MG tablet Take 325 mg by mouth daily.    . brimonidine (ALPHAGAN) 0.15 % ophthalmic solution Place 3 drops into both eyes daily.     Marland Kitchen latanoprost (XALATAN) 0.005 % ophthalmic solution Place 1 drop into both eyes at bedtime.     Marland Kitchen lisinopril (PRINIVIL,ZESTRIL) 5 MG tablet Take 1 tablet by mouth daily.    . Probiotic Product (PROBIOTIC DAILY PO) Take 1 tablet by mouth daily.     No current facility-administered medications for this visit.    Allergies:   Oxycodone    Social History:  The patient  reports that he has never smoked. He has never used smokeless tobacco. He reports that he does not drink alcohol or use illicit drugs.   Family History:  The patient's family history includes Cancer in his father; Heart disease in his mother.    ROS:  Please see the history of present illness.   Otherwise, review of systems are positive for none.   All other systems are reviewed and negative.    PHYSICAL EXAM: VS:  BP 124/82 mmHg  Pulse 62  Ht 5\' 11"  (1.803 m)  Wt 163 lb (73.936 kg)  BMI 22.74 kg/m2 , BMI Body mass index is 22.74 kg/(m^2). GEN: Well nourished, well developed, in no acute distress HEENT: normal Neck: no JVD, carotid bruits, or masses Cardiac: RRR; no murmurs, rubs, or gallops,no edema .  Occasional PVCs Respiratory:  clear to auscultation bilaterally, normal work of breathing GI: soft, nontender, nondistended, + BS MS: no deformity or atrophy Skin: warm and dry, no rash Neuro:  Strength and sensation are intact Psych: euthymic mood, full affect   EKG:  EKG is not ordered today.    Recent Labs: 12/28/2013: Magnesium 2.3 02/20/2014: ALT 34; BUN 23; Creatinine, Ser 0.89; Hemoglobin 10.5*; Platelets 179.0; Potassium 4.4; Sodium 135; TSH 6.02*    Lipid Panel    Component Value Date/Time   CHOL 142 02/20/2014 1034   TRIG 57.0 02/20/2014 1034   HDL 65.60 02/20/2014 1034   CHOLHDL 2 02/20/2014 1034   VLDL 11.4 02/20/2014 1034    LDLCALC 65 02/20/2014 1034      Wt Readings from Last 3 Encounters:  07/13/14 163 lb (73.936 kg)  06/07/14 167 lb (75.751 kg)  04/05/14 168 lb 1.9 oz (76.259 kg)         ASSESSMENT AND PLAN:  1.  1. 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 He also underwent CABG 2. The date of his surgery was 12/27/13 3. 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 history of sleep apnea followed by Dr. Baird Lyons 5. past history of elevated PSA. Patient has symptoms of urinary tract infection with frequency and nocturia. He now has a urologist, Dr. [name]. He has urinary retention and is self catheterizing himself nightly. 6. Postoperative atrial fibrillation, now back in sinus rhythm off amiodarone 7.  PVCs 8.  Right leg pain  in etiology.  I offered him referral to orthopedist but he would prefer to wait and  see if he gets better himself   Current medicines are reviewed at length with the patient today.  The patient does not have concerns regarding medicines.  The following changes have been made:  no change  Labs/ tests ordered today include:   Orders Placed This Encounter  Procedures  . Lipid panel  . Basic metabolic panel  . CBC with Differential/Platelet  . Hepatic function panel     Disposition: We are checking lab work today including CBC lipid panel hepatic function panel and basal metabolic panel.  He is not currently on Lipitor.  He does have coronary disease.  We will want to discuss this further after we get his labs back.  Recheck in 4 months for office visit and EKG  Signed, Darlin Coco MD 07/13/2014 10:14 AM    [address] [place] [place], [place], [place]  [address] Phone: [phone]; Fax: [phone]

## 2014-07-13 NOTE — Addendum Note (Signed)
Addended by: Eulis Foster on: 07/13/2014 09:04 AM   Modules accepted: Orders

## 2014-07-16 NOTE — Progress Notes (Signed)
Quick Note:  Please report to patient. The recent labs are stable. Continue same medication and careful diet. LDL is higher. Watch diet better. ______

## 2014-07-16 NOTE — Progress Notes (Signed)
Quick Note:  Please report to patient. The recent labs are stable. Continue same medication and careful diet. Hgb is much better. ______

## 2014-07-27 ENCOUNTER — Telehealth: Payer: Self-pay | Admitting: Cardiology

## 2014-07-27 NOTE — Telephone Encounter (Signed)
He would need to see a back specialist (either ortho or neurosurgeon) and have them order the appropriate xrays/MRIs etc. If they want [place] to make a referral, we could do that.

## 2014-07-27 NOTE — Telephone Encounter (Signed)
Spoke with patients daughter and patient saw a Restaurant manager, fast food and she recommended patient get an MRI (concerned bulging disc and having hip issues) Explained to daughter that  Dr. Mare Ferrari may want patient to see Orthopedic/Neuro first so they order exactly what the patient needs Family just concerned with all the expenses of seeing doctor before and after MRI Advised daughter would forward to  Dr. Mare Ferrari for review

## 2014-07-27 NOTE — Telephone Encounter (Signed)
New Message      Pt's daughter calling stating that Dr. Mare Ferrari was going to refer pt to an Orthopedic doctor for an MRI, daughter wants to know if they can get the MRI done prior to seeing the doctor and if so, can we go ahead and arrange for that to be scheduled. Please call back and advise.

## 2014-07-31 NOTE — Telephone Encounter (Signed)
Left message to call back  

## 2014-08-02 NOTE — Telephone Encounter (Signed)
Left message to call back  

## 2014-08-02 NOTE — Telephone Encounter (Signed)
[place] pt's daughter returning Melinda's call

## 2014-08-10 ENCOUNTER — Other Ambulatory Visit: Payer: Self-pay

## 2014-08-10 MED ORDER — LISINOPRIL 5 MG PO TABS: 5.0000 mg | ORAL_TABLET | Freq: Every day | ORAL | Status: DC

## 2014-08-13 ENCOUNTER — Telehealth: Payer: Self-pay | Admitting: Cardiology

## 2014-08-13 DIAGNOSIS — Z1389 Encounter for screening for other disorder: Secondary | ICD-10-CM | POA: Diagnosis not present

## 2014-08-13 DIAGNOSIS — M25551 Pain in right hip: Secondary | ICD-10-CM | POA: Diagnosis not present

## 2014-08-13 DIAGNOSIS — Z9181 History of falling: Secondary | ICD-10-CM | POA: Diagnosis not present

## 2014-08-13 DIAGNOSIS — Z23 Encounter for immunization: Secondary | ICD-10-CM | POA: Diagnosis not present

## 2014-08-13 DIAGNOSIS — N390 Urinary tract infection, site not specified: Secondary | ICD-10-CM | POA: Diagnosis not present

## 2014-08-13 DIAGNOSIS — S71151A Open bite, right thigh, initial encounter: Secondary | ICD-10-CM | POA: Diagnosis not present

## 2014-08-13 NOTE — Telephone Encounter (Signed)
New message     Returning a nurses call to get referral to orthopedist

## 2014-08-13 NOTE — Telephone Encounter (Signed)
I spoke with the patient's daughter. I advised her the last message I saw regarding a referral was from 07/27/14- Dr. Mare Ferrari had mentioned that he would need to see an orthopedist/ neurosurgeon prior to any imaging studies being done. No specific provider was mentioned for the referral. I advised the patient's daughter that Dr. Mare Ferrari and Rip Harbour are out all week- we can check with one of our other providers for recommendations. Per Lattie Haw, Dr. Mare Ferrari mentioned to her parents the name of a doctor and she would check with them to see if they remembered who it was.  I explained to her if they could remember, we would be glad to place the referral this week. The patient lives in [place]- I also offered we could check with our satellite office in [place] to see who Dr. Rockey Situ Dr. Fletcher Anon refer to in the [place] area. Lattie Haw will call back and let us know what they would like to do.

## 2014-08-16 NOTE — Telephone Encounter (Signed)
See phone note 08/13/14

## 2014-08-20 NOTE — Telephone Encounter (Signed)
Emily Filbert, RN at 08/13/2014 3:50 PM     Status: Signed       Expand All Collapse All   I spoke with the patient's daughter. I advised her the last message I saw regarding a referral was from 07/27/14- Dr. Mare Ferrari had mentioned that he would need to see an orthopedist/ neurosurgeon prior to any imaging studies being done. No specific provider was mentioned for the referral. I advised the patient's daughter that Dr. Mare Ferrari and Rip Harbour are out all week- we can check with one of our other providers for recommendations. Per Lattie Haw, Dr. Mare Ferrari mentioned to her parents the name of a doctor and she would check with them to see if they remembered who it was.  I explained to her if they could remember, we would be glad to place the referral this week. The patient lives in [place]- I also offered we could check with our satellite office in [place] to see who Dr. Rockey Situ Dr. Fletcher Anon refer to in the [place] area. Lattie Haw will call back and let us know what they would like to do.

## 2014-08-20 NOTE — Telephone Encounter (Signed)
Suggest referral to orthopedist Dr. Lindwood Qua.

## 2014-08-21 ENCOUNTER — Telehealth: Payer: Self-pay | Admitting: Cardiology

## 2014-08-21 NOTE — Telephone Encounter (Signed)
Appointment with Dr Gladstone Lighter 09/01/14 at 10:15 am Advised daughter

## 2014-08-21 NOTE — Telephone Encounter (Signed)
New Message       Pt's daughter calling stating that Dr. Mare Ferrari was going to refer pt to an orthopedic doctor and they are wanting to get that referral and find out the name of that doctor. Please call back and advise.

## 2014-08-22 DIAGNOSIS — H401233 Low-tension glaucoma, bilateral, severe stage: Secondary | ICD-10-CM | POA: Diagnosis not present

## 2014-08-29 DIAGNOSIS — H401233 Low-tension glaucoma, bilateral, severe stage: Secondary | ICD-10-CM | POA: Diagnosis not present

## 2014-09-01 DIAGNOSIS — M1611 Unilateral primary osteoarthritis, right hip: Secondary | ICD-10-CM | POA: Diagnosis not present

## 2014-09-19 NOTE — Telephone Encounter (Signed)
Okay to take mobic but he should reduce ASA to just 81 mg daily while on mobic.

## 2014-09-19 NOTE — Telephone Encounter (Signed)
Advised wife, verbalized understanding.  

## 2014-09-19 NOTE — Telephone Encounter (Signed)
Spoke with wife and she stated patient saw orthopedist Patient needs hip replacement but not having at this time Taking last dose of Prednisone today but Dr Gladstone Lighter wants to start him on Mobic 15 mg daily Wife and patient have looked over there possible side effects and are very concerned about taking it and would like  Dr. Sherryl Barters opinion  Will forward to  Dr. Mare Ferrari for review

## 2014-09-19 NOTE — Telephone Encounter (Signed)
Follow up     Patient wife has some questions regarding medication that he's on.     Pt C/O medication issue:  1. Name of Medication: neloxicom  15 mg   2. How are you currently taking this medication (dosage and times per day)? New medication   3. Are you having a reaction (difficulty breathing--STAT)? New medication has not started yet   4. What is your medication issue? Want to discuss medication if he should be taken this or not.

## 2014-09-20 DIAGNOSIS — R339 Retention of urine, unspecified: Secondary | ICD-10-CM | POA: Diagnosis not present

## 2014-09-20 DIAGNOSIS — M1611 Unilateral primary osteoarthritis, right hip: Secondary | ICD-10-CM | POA: Diagnosis not present

## 2014-09-20 DIAGNOSIS — H6120 Impacted cerumen, unspecified ear: Secondary | ICD-10-CM | POA: Diagnosis not present

## 2014-09-20 DIAGNOSIS — N390 Urinary tract infection, site not specified: Secondary | ICD-10-CM | POA: Diagnosis not present

## 2014-11-07 ENCOUNTER — Telehealth: Payer: Self-pay | Admitting: Cardiology

## 2014-11-07 DIAGNOSIS — Z6824 Body mass index (BMI) 24.0-24.9, adult: Secondary | ICD-10-CM | POA: Diagnosis not present

## 2014-11-07 DIAGNOSIS — K4090 Unilateral inguinal hernia, without obstruction or gangrene, not specified as recurrent: Secondary | ICD-10-CM | POA: Diagnosis not present

## 2014-11-07 DIAGNOSIS — Z1389 Encounter for screening for other disorder: Secondary | ICD-10-CM | POA: Diagnosis not present

## 2014-11-07 NOTE — Telephone Encounter (Signed)
Wife calling (received permission from Mr. Mceuen to speak to her) wanting to know who the surgeon was that did hernia surgery on him about 10-12 yrs ago.  States they don't remember.  He has another hernia on the (L) side and probably needs surgery again.  He is seeing his PCP this afternoon and probably will recommend surgery.  They think he probably will need clearance and will need an appointment to see Dr. Mare Ferrari.  Advised them that Dr. Mare Ferrari would be retiring effective March 13, 2015.  She wanted to make an appointment with him so gave her an appointment for 12/9 at 3:45.  Looked thru office notes and did not see mention of a surgeon who did his hernia repair.  Will request his chart from storage.  Advised may take a couple of days.  She says Dr. Mare Ferrari might remember who the surgeon was that did repair.  Will forward to [facility] and Dr. Mare Ferrari.  Advised Dr. Mare Ferrari will be back in office on Monday, 11/12/14

## 2014-11-07 NOTE — Telephone Encounter (Signed)
New message     Pt is needing hernia surgery but cannot remember the doctor's name Dr Mare Ferrari referred him to years ago.  Please look in patient's file and call wife and give name of hernia surgeon.  She states it may have been 4yrs ago.

## 2014-11-08 NOTE — Telephone Encounter (Signed)
I do not recall who is surgeon was. Perhaps it will be in the old paper chart which Rodena Piety requested.

## 2014-11-12 NOTE — Telephone Encounter (Signed)
Spoke with wife and she had already found out Dr Excell Seltzer did previous surgery Moved follow up appointment up to 11/23/14 since patient will be seeing surgeon on 11/29/14

## 2014-11-23 ENCOUNTER — Ambulatory Visit (INDEPENDENT_AMBULATORY_CARE_PROVIDER_SITE_OTHER): Payer: Medicare Other | Admitting: Cardiology

## 2014-11-23 ENCOUNTER — Encounter: Payer: Self-pay | Admitting: Cardiology

## 2014-11-23 VITALS — BP 140/70 | HR 59 | Ht 71.0 in | Wt 168.1 lb

## 2014-11-23 DIAGNOSIS — I493 Ventricular premature depolarization: Secondary | ICD-10-CM

## 2014-11-23 DIAGNOSIS — Z954 Presence of other heart-valve replacement: Secondary | ICD-10-CM | POA: Diagnosis not present

## 2014-11-23 DIAGNOSIS — Z952 Presence of prosthetic heart valve: Secondary | ICD-10-CM

## 2014-11-23 DIAGNOSIS — Z951 Presence of aortocoronary bypass graft: Secondary | ICD-10-CM | POA: Diagnosis not present

## 2014-11-23 LAB — BASIC METABOLIC PANEL
BUN: 14 mg/dL (ref 7–25)
CHLORIDE: 102 mmol/L (ref 98–110)
CO2: 22 mmol/L (ref 20–31)
Calcium: 9 mg/dL (ref 8.6–10.3)
Creat: 0.73 mg/dL (ref 0.70–1.18)
Glucose, Bld: 88 mg/dL (ref 65–99)
POTASSIUM: 4.2 mmol/L (ref 3.5–5.3)
Sodium: 137 mmol/L (ref 135–146)

## 2014-11-23 LAB — CBC WITH DIFFERENTIAL/PLATELET
BASOS ABS: 0.1 10*3/uL (ref 0.0–0.1)
Basophils Relative: 1 % (ref 0–1)
EOS PCT: 1 % (ref 0–5)
Eosinophils Absolute: 0.1 10*3/uL (ref 0.0–0.7)
HEMATOCRIT: 36.5 % — AB (ref 39.0–52.0)
HEMOGLOBIN: 12.7 g/dL — AB (ref 13.0–17.0)
LYMPHS ABS: 2 10*3/uL (ref 0.7–4.0)
LYMPHS PCT: 25 % (ref 12–46)
MCH: 29.7 pg (ref 26.0–34.0)
MCHC: 34.8 g/dL (ref 30.0–36.0)
MCV: 85.3 fL (ref 78.0–100.0)
MPV: 8.8 fL (ref 8.6–12.4)
Monocytes Absolute: 0.5 10*3/uL (ref 0.1–1.0)
Monocytes Relative: 7 % (ref 3–12)
NEUTROS ABS: 5.1 10*3/uL (ref 1.7–7.7)
Neutrophils Relative %: 66 % (ref 43–77)
Platelets: 159 10*3/uL (ref 150–400)
RBC: 4.28 MIL/uL (ref 4.22–5.81)
RDW: 14.3 % (ref 11.5–15.5)
WBC: 7.8 10*3/uL (ref 4.0–10.5)

## 2014-11-23 LAB — MAGNESIUM: Magnesium: 1.9 mg/dL (ref 1.5–2.5)

## 2014-11-23 NOTE — Progress Notes (Signed)
Cardiology Office Note   Date:  11/23/2014   ID:  [name] [name], DOB 1940-07-03, MRN [id number]  PCP:  [place]ides Sake, MD  Cardiologist: Darlin Coco MD  Chief Complaint  Patient presents with  . Aortic Stenosis      History of Present Illness: [name] [name] is a 74 y.o. male who presents for scheduled follow-up visit . 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 His surgery was by Dr. Servando Snare in 02/09/03. At the time of his cardiac catheterization he did not have any significant coronary disease and did not require bypass surgery. He also has a history of hypercholesterolemia. Marland Kitchen  He did well until October 2015 when he began to experience symptoms of increased dyspnea and orthopnea. 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 His left ventricle was dilated and his ejection fraction had fallen to 30-35%. He underwent repeat cardiac catheterization and then underwent a redo aortic valve replacement with a pericardial tissue valve as well as two-vessel coronary artery bypass graft surgery by Dr. Servando Snare on 12/27/13. The patient had postoperative atrial fibrillation. He was discharged home on amiodarone which was subsequently stopped and he has maintained normal sinus rhythm He had a follow-up postoperative echocardiogram on 03/29/14 which showed normal left ventricular systolic function with an ejection fraction of 60-65% and his bioprosthetic aortic valve is functioning normally. The patient had problems with postoperative urinary retention secondary to BPH and for a while he was having dyspnea self catheterize himself.  However, he is now voiding without difficulty Since last visit the patient has had some discomfort in the right leg.He has been evaluated by Dr. Gladstone Lighter and he needs to have a right total hip  replacement.  He has not decided whether to proceed or not The patient also has been diagnosed with a left inguinal hernia.  He has a remote history of right inguinal hernia surgery by Dr. Excell Seltzer. From the cardiac standpoint the patient is doing well.  He denies any chest pain or shortness of breath.  No dizziness or syncope.  His EKG today shows frequent PVCs but the patient is not aware of these. The patient has elevated LDL and has known coronary disease but has declined our recommendation to go on a statin.   Past Medical History  Diagnosis Date  . Aortic stenosis   . Hyperlipidemia   . Heart murmur   . Sleep apnea     mild to moderate  . Glaucoma     uses eye drops daily  . CHF (congestive heart failure) (HCC)     takes Furosemide daily    Past Surgical History  Procedure Laterality Date  . Cardiac catheterization  02/08/2003    severe aortic stenosis,normal left ventricular function  . 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 Inguinal hernia repair  04/2003  . Left and right heart catheterization with coronary angiogram N/A 12/18/2013    Procedure: LEFT AND RIGHT HEART CATHETERIZATION WITH CORONARY ANGIOGRAM;  Surgeon: Peter M Martinique, MD;  [facility] C[facility] LAB;  Service: Cardiovascular;  Laterality: N/A;  . Aortic valve replacement N/A 12/27/2013    Procedure: REDO AORTIC VALVE REPLACEMENT WITH 23MM MAGNA EASE PERICARDIAL TISSUE VALVE;  Surgeon: Grace Isaac, MD;  Location: [facility];  Service: Open Heart Surgery;  Laterality:  N/A;  Darden Dates without cardioversion N/A 12/27/2013    Procedure: TRANSESOPHAGEAL ECHOCARDIOGRAM (TEE);  Surgeon: Grace Isaac, MD;  Location: [facility];  Service: Open Heart Surgery;  Laterality: N/A;  . Coronary artery bypass graft N/A 12/27/2013    Procedure: CORONARY ARTERY BYPASS GRAFTING TIMES TWO USING RIGHT LEG SAPHENOUS VEIN TO RIGHT CORONARY ARTERY AND LEFT INTERNAL MAMMARY ARTERY TO LEFT ANTERIOR DESCENDING  ARTERY;  Surgeon: Grace Isaac, MD;  Location: [facility];  Service: Open Heart Surgery;  Laterality: N/A;     Current Outpatient Prescriptions  Medication Sig Dispense Refill  . amoxicillin (AMOXIL) 500 MG tablet Take 2,000 mg by mouth as needed (1 hour prior to dental procedures).    Marland Kitchen aspirin 81 MG tablet Take 81 mg by mouth daily.    . brimonidine (ALPHAGAN) 0.15 % ophthalmic solution Place 3 drops into both eyes daily.     Marland Kitchen latanoprost (XALATAN) 0.005 % ophthalmic solution Place 1 drop into both eyes at bedtime.     Marland Kitchen lisinopril (PRINIVIL,ZESTRIL) 5 MG tablet Take 1 tablet (5 mg total) by mouth daily. 90 tablet 3  . Methylsulfonylmethane (MSM PO) Take 2 capsules by mouth 3 (three) times daily.    . Probiotic Product (PROBIOTIC DAILY PO) Take 1 tablet by mouth daily.     No current facility-administered medications for this visit.    Allergies:   Oxycodone    Social History:  The patient  reports that he has never smoked. He has never used smokeless tobacco. He reports that he does not drink alcohol or use illicit drugs.   Family History:  The patient's family history includes Cancer in his father; Heart disease in his mother.    ROS:  Please see the history of present illness.   Otherwise, review of systems are positive for none.   All other systems are reviewed and negative.    PHYSICAL EXAM: VS:  BP 140/70 mmHg  Pulse 59  Ht 5\' 11"  (1.803 m)  Wt 168 lb 1.9 oz (76.259 kg)  BMI 23.46 kg/m2 , BMI Body mass index is 23.46 kg/(m^2). GEN: Well nourished, well developed, in no acute distress HEENT: normal Neck: no JVD, carotid bruits, or masses Cardiac: RRR; soft systolic murmur across the prosthetic aortic valve.  No diastolic murmur.  No gallop or rub.  There is no peripheral edema.  Pedal pulses are good. Respiratory:  clear to auscultation bilaterally, normal work of breathing GI: soft, nontender, nondistended, + BS MS: no deformity or atrophy Skin: warm and dry, no  rash Neuro:  Strength and sensation are intact Psych: euthymic mood, full affect   EKG:  EKG is ordered today. The ekg ordered today demonstrates normal sinus rhythm with first-degree AV block and occasional PACs and PVCs.  Nonspecific ST-T wave changes.   Recent Labs: 12/28/2013: Magnesium 2.3 02/20/2014: TSH 6.02* 07/13/2014: ALT 17; BUN 15; Creatinine, Ser 0.74; Hemoglobin 12.6*; Platelets 188.0; Potassium 4.1; Sodium 136    Lipid Panel    Component Value Date/Time   CHOL 187 07/13/2014 0904   TRIG 44.0 07/13/2014 0904   HDL 61.30 07/13/2014 0904   CHOLHDL 3 07/13/2014 0904   VLDL 8.8 07/13/2014 0904   LDLCALC 117* 07/13/2014 0904      Wt Readings from Last 3 Encounters:  11/23/14 168 lb 1.9 oz (76.259 kg)  07/13/14 163 lb (73.936 kg)  06/07/14 167 lb (75.751 kg)       ASSESSMENT AND PLAN:  1. 1. 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 He also underwent CABG 2. The date of his surgery was 12/27/13 3. Normal left ventricular systolic function by echocardiogram 03/29/14 showing ejection fraction of 60-65%. 4. history of sleep apnea followed by Dr. Baird Lyons 5. past history of elevated PSA. Followed by Dr. [name]. 6. Postoperative atrial fibrillation, now back in sinus rhythm off amiodarone 7. PVCs 8. Osteoarthritis of right hip followed by Dr. Gladstone Lighter 9.  Left inguinal hernia.  History of right inguinal herniorrhaphy by Dr. Excell Seltzer many years ago 6.  Hypercholesterolemia.  Declines statins.  Current medicines are reviewed at length with the patient today.  The patient does not have concerns regarding medicines.  The following changes have been made:  no change  Labs/ tests ordered today include:   Orders Placed This Encounter  Procedures  . Basic metabolic panel  . CBC with Differential/Platelet  . Magnesium  . EKG 12-Lead     Disposition:  We  are checking labs today referable to his frequent PVCs.  We are decreasing his aspirin to just 81 mg daily. From the cardiology standpoint he is cleared for proceeding with his needed surgeries. Following my retirement the patient will follow-up with Dr. Rockey Situ in [place].  In 4 months.  Berna Spare MD 11/23/2014 5:10 PM    [facility] [place], [place], [place]  [address] Phone: [phone]; Fax: [phone]

## 2014-11-23 NOTE — Patient Instructions (Signed)
Medication Instructions:  DECREASE YOUR ASPIRIN TO 81 MG DAILY   Labwork: BMET/CBC/MAGNESIUM  Testing/Procedures: NONE  Follow-Up: Your physician recommends that you schedule a follow-up appointment in: [place]   If you need a refill on your cardiac medications before your next appointment, please call your pharmacy.

## 2014-11-25 NOTE — Progress Notes (Signed)
Quick Note:  Please report to patient. The recent labs are stable. Continue same medication and careful diet. ______ 

## 2014-11-29 DIAGNOSIS — K4090 Unilateral inguinal hernia, without obstruction or gangrene, not specified as recurrent: Secondary | ICD-10-CM | POA: Diagnosis not present

## 2014-11-30 ENCOUNTER — Other Ambulatory Visit: Payer: Self-pay | Admitting: General Surgery

## 2014-12-10 ENCOUNTER — Telehealth: Payer: Self-pay | Admitting: Cardiology

## 2014-12-10 NOTE — Telephone Encounter (Signed)
Follow up      Did we receive a clearance form from [facility]?  They told pt they faxed it to [place] but have not received it back.  Please let her know if we have it.

## 2014-12-10 NOTE — Telephone Encounter (Signed)
New Message  Pt daughter requests to speak w/ RN- please call back and discuss.

## 2014-12-10 NOTE — Telephone Encounter (Signed)
Spoke with Dr Matilde Haymaker office and sent the dictation from last ov with  Dr. Mare Ferrari as he was cleared for surgery at the visit Advised daughter ov sent to Dr Lear Ng office and to follow up with them tomorrow If his office absolutely must have form ask them to resend

## 2014-12-11 ENCOUNTER — Other Ambulatory Visit: Payer: Self-pay | Admitting: General Surgery

## 2014-12-14 ENCOUNTER — Encounter (HOSPITAL_COMMUNITY): Payer: Self-pay | Admitting: *Deleted

## 2014-12-18 ENCOUNTER — Encounter (HOSPITAL_COMMUNITY)
Admission: RE | Disposition: A | Discharge status: Home / Self Care | Payer: Self-pay | Source: Ambulatory Visit | Attending: General Surgery

## 2014-12-18 ENCOUNTER — Ambulatory Visit (HOSPITAL_COMMUNITY): Payer: Medicare Other | Admitting: Anesthesiology

## 2014-12-18 ENCOUNTER — Ambulatory Visit (HOSPITAL_COMMUNITY)
Admission: RE | Admit: 2014-12-18 | Discharge: 2014-12-18 | Disposition: A | Discharge status: Home / Self Care | Payer: Medicare Other | Source: Ambulatory Visit | Attending: General Surgery | Admitting: General Surgery

## 2014-12-18 ENCOUNTER — Encounter (HOSPITAL_COMMUNITY): Payer: Self-pay | Admitting: *Deleted

## 2014-12-18 DIAGNOSIS — Z7982 Long term (current) use of aspirin: Secondary | ICD-10-CM | POA: Diagnosis not present

## 2014-12-18 DIAGNOSIS — I10 Essential (primary) hypertension: Secondary | ICD-10-CM | POA: Insufficient documentation

## 2014-12-18 DIAGNOSIS — M1990 Unspecified osteoarthritis, unspecified site: Secondary | ICD-10-CM | POA: Diagnosis not present

## 2014-12-18 DIAGNOSIS — D176 Benign lipomatous neoplasm of spermatic cord: Secondary | ICD-10-CM | POA: Diagnosis not present

## 2014-12-18 DIAGNOSIS — K4090 Unilateral inguinal hernia, without obstruction or gangrene, not specified as recurrent: Secondary | ICD-10-CM

## 2014-12-18 DIAGNOSIS — Z79899 Other long term (current) drug therapy: Secondary | ICD-10-CM | POA: Insufficient documentation

## 2014-12-18 HISTORY — PX: INGUINAL HERNIA REPAIR: SHX194

## 2014-12-18 HISTORY — PX: INSERTION OF MESH: SHX5868

## 2014-12-18 HISTORY — DX: Unspecified osteoarthritis, unspecified site: M19.90

## 2014-12-18 SURGERY — REPAIR, HERNIA, INGUINAL, ADULT
Anesthesia: Monitor Anesthesia Care | Site: Groin | Laterality: Left

## 2014-12-18 MED ORDER — LACTATED RINGERS IV SOLN
INTRAVENOUS | Status: DC | PRN
  Administered 2014-12-18: 14:00:00 via INTRAVENOUS

## 2014-12-18 MED ORDER — FENTANYL CITRATE (PF) 100 MCG/2ML IJ SOLN
INTRAMUSCULAR | Status: DC | PRN
  Administered 2014-12-18: 50 ug via INTRAVENOUS
  Administered 2014-12-18 (×2): 25 ug via INTRAVENOUS
  Administered 2014-12-18: 50 ug via INTRAVENOUS

## 2014-12-18 MED ORDER — FENTANYL CITRATE (PF) 100 MCG/2ML IJ SOLN: 25.0000 ug | INTRAMUSCULAR | Status: DC | PRN

## 2014-12-18 MED ORDER — FENTANYL CITRATE (PF) 250 MCG/5ML IJ SOLN
INTRAMUSCULAR | Status: AC
  Filled 2014-12-18: qty 5

## 2014-12-18 MED ORDER — ONDANSETRON HCL 4 MG/2ML IJ SOLN
INTRAMUSCULAR | Status: AC
  Filled 2014-12-18: qty 2

## 2014-12-18 MED ORDER — LIDOCAINE HCL 1 % IJ SOLN
INTRAMUSCULAR | Status: AC
Start: 2014-12-18 — End: 2014-12-18
  Filled 2014-12-18: qty 20

## 2014-12-18 MED ORDER — ONDANSETRON HCL 4 MG/2ML IJ SOLN
INTRAMUSCULAR | Status: DC | PRN
Start: 2014-12-18 — End: 2014-12-18
  Administered 2014-12-18: 4 mg via INTRAVENOUS

## 2014-12-18 MED ORDER — PROPOFOL 10 MG/ML IV BOLUS
INTRAVENOUS | Status: AC
  Filled 2014-12-18: qty 20

## 2014-12-18 MED ORDER — CEFAZOLIN SODIUM-DEXTROSE 2-3 GM-% IV SOLR
2.0000 g | INTRAVENOUS | Status: AC
Start: 2014-12-18 — End: 2014-12-18
  Administered 2014-12-18: 2 g via INTRAVENOUS

## 2014-12-18 MED ORDER — CEFAZOLIN SODIUM-DEXTROSE 2-3 GM-% IV SOLR
INTRAVENOUS | Status: AC
  Filled 2014-12-18: qty 50

## 2014-12-18 MED ORDER — BUPIVACAINE-EPINEPHRINE 0.25% -1:200000 IJ SOLN
INTRAMUSCULAR | Status: AC
Start: 2014-12-18 — End: 2014-12-18
  Filled 2014-12-18: qty 1

## 2014-12-18 MED ORDER — BUPIVACAINE-EPINEPHRINE (PF) 0.5% -1:200000 IJ SOLN
INTRAMUSCULAR | Status: DC | PRN
  Administered 2014-12-18: 30 mL

## 2014-12-18 MED ORDER — EPHEDRINE SULFATE 50 MG/ML IJ SOLN
INTRAMUSCULAR | Status: DC | PRN
  Administered 2014-12-18 (×2): 5 mg via INTRAVENOUS

## 2014-12-18 MED ORDER — 0.9 % SODIUM CHLORIDE (POUR BTL) OPTIME
TOPICAL | Status: DC | PRN
  Administered 2014-12-18: 1000 mL

## 2014-12-18 MED ORDER — PROPOFOL 500 MG/50ML IV EMUL
INTRAVENOUS | Status: DC | PRN
  Administered 2014-12-18: 50 mg via INTRAVENOUS

## 2014-12-18 MED ORDER — HYDROCODONE-ACETAMINOPHEN 5-325 MG PO TABS: 1.0000 | ORAL_TABLET | ORAL | Status: DC | PRN

## 2014-12-18 MED ORDER — LIDOCAINE-EPINEPHRINE (PF) 1 %-1:200000 IJ SOLN
INTRAMUSCULAR | Status: AC
  Filled 2014-12-18: qty 30

## 2014-12-18 MED ORDER — EPHEDRINE SULFATE 50 MG/ML IJ SOLN
INTRAMUSCULAR | Status: AC
  Filled 2014-12-18: qty 1

## 2014-12-18 MED ORDER — SODIUM CHLORIDE 0.9 % IJ SOLN
INTRAMUSCULAR | Status: AC
  Filled 2014-12-18: qty 10

## 2014-12-18 MED ORDER — BUPIVACAINE-EPINEPHRINE (PF) 0.5% -1:200000 IJ SOLN
INTRAMUSCULAR | Status: AC
  Filled 2014-12-18: qty 30

## 2014-12-18 MED ORDER — PROPOFOL 500 MG/50ML IV EMUL
INTRAVENOUS | Status: DC | PRN
  Administered 2014-12-18: 75 ug/kg/min via INTRAVENOUS

## 2014-12-18 SURGICAL SUPPLY — 44 items
BLADE HEX COATED 2.75 (ELECTRODE) ×3 IMPLANT
BLADE SURG 15 STRL LF DISP TIS (BLADE) ×1 IMPLANT
BLADE SURG 15 STRL SS (BLADE) ×3
BLADE SURG SZ10 CARB STEEL (BLADE) ×3 IMPLANT
CLOSURE WOUND 1/2 X4 (GAUZE/BANDAGES/DRESSINGS)
COVER SURGICAL LIGHT HANDLE (MISCELLANEOUS) ×3 IMPLANT
DECANTER SPIKE VIAL GLASS SM (MISCELLANEOUS) IMPLANT
DRAIN PENROSE 18X1/2 LTX STRL (DRAIN) ×3 IMPLANT
DRAPE INCISE IOBAN 66X45 STRL (DRAPES) ×3 IMPLANT
DRAPE LAPAROTOMY TRNSV 102X78 (DRAPE) ×3 IMPLANT
ELECT PENCIL ROCKER SW 15FT (MISCELLANEOUS) ×3 IMPLANT
ELECT REM PT RETURN 9FT ADLT (ELECTROSURGICAL) ×3
ELECTRODE REM PT RTRN 9FT ADLT (ELECTROSURGICAL) ×1 IMPLANT
GAUZE SPONGE 4X4 12PLY STRL (GAUZE/BANDAGES/DRESSINGS) IMPLANT
GLOVE BIOGEL PI IND STRL 7.0 (GLOVE) ×1 IMPLANT
GLOVE BIOGEL PI IND STRL 7.5 (GLOVE) ×1 IMPLANT
GLOVE BIOGEL PI INDICATOR 7.0 (GLOVE) ×2
GLOVE BIOGEL PI INDICATOR 7.5 (GLOVE) ×2
GLOVE ECLIPSE 7.5 STRL STRAW (GLOVE) ×3 IMPLANT
GOWN STRL REUS W/TWL LRG LVL3 (GOWN DISPOSABLE) ×3 IMPLANT
GOWN STRL REUS W/TWL XL LVL3 (GOWN DISPOSABLE) ×6 IMPLANT
KIT BASIN OR (CUSTOM PROCEDURE TRAY) ×3 IMPLANT
LIQUID BAND (GAUZE/BANDAGES/DRESSINGS) ×2 IMPLANT
MESH HERNIA 3X6 (Mesh General) ×2 IMPLANT
NDL HYPO 25X1 1.5 SAFETY (NEEDLE) ×1 IMPLANT
NEEDLE HYPO 22GX1.5 SAFETY (NEEDLE) IMPLANT
NEEDLE HYPO 25X1 1.5 SAFETY (NEEDLE) ×3 IMPLANT
NS IRRIG 1000ML POUR BTL (IV SOLUTION) ×3 IMPLANT
PACK BASIC VI WITH GOWN DISP (CUSTOM PROCEDURE TRAY) ×3 IMPLANT
SPONGE LAP 4X18 X RAY DECT (DISPOSABLE) ×3 IMPLANT
STRIP CLOSURE SKIN 1/2X4 (GAUZE/BANDAGES/DRESSINGS) IMPLANT
SUT MNCRL AB 4-0 PS2 18 (SUTURE) ×3 IMPLANT
SUT PROLENE 2 0 CT2 30 (SUTURE) ×6 IMPLANT
SUT SILK 2 0 SH (SUTURE) ×3 IMPLANT
SUT VIC AB 3-0 54XBRD REEL (SUTURE) ×1 IMPLANT
SUT VIC AB 3-0 BRD 54 (SUTURE) ×3
SUT VIC AB 3-0 CT1 27 (SUTURE) ×3
SUT VIC AB 3-0 CT1 TAPERPNT 27 (SUTURE) ×1 IMPLANT
SUT VIC AB 3-0 SH 27 (SUTURE)
SUT VIC AB 3-0 SH 27XBRD (SUTURE) IMPLANT
SYR BULB IRRIGATION 50ML (SYRINGE) ×3 IMPLANT
SYR CONTROL 10ML LL (SYRINGE) ×3 IMPLANT
TOWEL OR 17X26 10 PK STRL BLUE (TOWEL DISPOSABLE) ×3 IMPLANT
YANKAUER SUCT BULB TIP 10FT TU (MISCELLANEOUS) ×3 IMPLANT

## 2014-12-18 NOTE — Interval H&P Note (Signed)
History and Physical Interval Note:  12/18/2014 1:29 PM  [name] [name]  has presented today for surgery, with the diagnosis of left inguinal hernia  The various methods of treatment have been discussed with the patient and family. After consideration of risks, benefits and other options for treatment, the patient has consented to  Procedure(s): OPEN LEFT INGUINAL HERNIA REPAIR (Left) INSERTION OF MESH (Left) as a surgical intervention .  The patient's history has been reviewed, patient examined, no change in status, stable for surgery.  I have reviewed the patient's chart and labs.  Questions were answered to the patient's satisfaction.     [name]

## 2014-12-18 NOTE — Op Note (Signed)
Preoperative Diagnosis: Left inguinal hernia  Postoperative diagnosis: Same  Procedure: Open repair of left inguinal hernia with mesh  Surgeon: Excell Seltzer M.D.  Anesthesia: MAC  Estimated blood loss: Minimal  Complications: None  Description of procedure: Patient underwent a pre op TAP block by anesthesia.   Patient was brought to the operating room, placed in supine position on the operating table.  IV sedation was induced by anesthesiology. The groin was widely sterilely prepped and draped. The patient received preoperative IV antibiotics. Patient timeout was performed and correct procedure verified. 1% xylocaine was used to infiltrate the soft tissues. An oblique incision was made in the groin and dissection carried down through the subcutaneous tissue using cautery. The external oblique was identified and cleared down to the inguinal ligament. The external oblique was divided along the lines of its fibers through the external ring. The cord was dissected up off the floor the pubic tubercle. Cremasteric fibers were divided bilaterally freeing the cord to the internal ring. The floor was intact. The internal ring was dilated. A large cord lipoma was dissected free and divided and ligated at the internal ring.  The peritoneum was identified at the internal ring but there was no sac. A piece of polypropylene mesh was chosen and trimmed to size to fit the floor of the inguinal canal with tails to go around the cord at the internal ring. The mesh was sutured initially to the pubic tubercle and then to the iliopubic tract and inguinal ligament working medial to lateral with 2-0 Prolene until the suture was lateral to the internal ring. Medially the mesh was sutured to the rectus sheath with interrupted 2-0 Prolene. The tails were then tacked together lateral to the internal ring creating a new internal ring snug to a fingertip. This provided nice broad coverage of the direct and indirect spaces.  The soft tissue was then infiltrated with local anesthesia. The cord was returned to its anatomic position. There was no bleeding. The external oblique was closed with running 3-0 Vicryl. Scarpa's fascia was closed with running 3-0 Vicryl. The skin was closed with subcuticular Monocryl and Dermabond. Sponge needle and instrument counts were correct. The patient was taken to recovery in good condition.  [name] [name]  12/18/2014

## 2014-12-18 NOTE — Discharge Instructions (Signed)
[facility] _______[facility]  UMBILICAL OR INGUINAL HERNIA REPAIR: POST OP INSTRUCTIONS  Always review your discharge instruction sheet given to you by the facility where your surgery was performed. IF YOU HAVE DISABILITY OR FAMILY LEAVE FORMS, YOU MUST BRING THEM TO THE OFFICE FOR PROCESSING.   DO NOT GIVE THEM TO YOUR DOCTOR.  1. A  prescription for pain medication may be given to you upon discharge.  Take your pain medication as prescribed, if needed.  If narcotic pain medicine is not needed, then you may take acetaminophen (Tylenol) or ibuprofen (Advil) as needed. 2. Take your usually prescribed medications unless otherwise directed. 3. If you need a refill on your pain medication, please contact your pharmacy.  They will contact our office to request authorization. Prescriptions will not be filled after 5 pm or on week-ends. 4. You should follow a light diet the first 24 hours after arrival home, such as soup and crackers, etc.  Be sure to include lots of fluids daily.  Resume your normal diet the day after surgery. 5. Most patients will experience some swelling and bruising around the umbilicus or in the groin and scrotum.  Ice packs and reclining will help.  Swelling and bruising can take several days to resolve.  6. It is common to experience some constipation if taking pain medication after surgery.  Increasing fluid intake and taking a stool softener (such as Colace) will usually help or prevent this problem from occurring.  A mild laxative (Milk of Magnesia or Miralax) should be taken according to package directions if there are no bowel movements after 48 hours. 7. Unless discharge instructions indicate otherwise, you may remove your bandages 24-48 hours after surgery, and you may shower at that time.  You may have steri-strips (small skin tapes) in place directly over the incision.  These strips should be left on the skin for 7-10 days.  If your surgeon used skin glue on the  incision, you may shower in 24 hours.  The glue will flake off over the next 2-3 weeks.  Any sutures or staples will be removed at the office during your follow-up visit. 8. ACTIVITIES:  You may resume regular (light) daily activities beginning the next day--such as daily self-care, walking, climbing stairs--gradually increasing activities as tolerated.  You may have sexual intercourse when it is comfortable.  Refrain from any heavy lifting or straining until approved by your doctor. a. You may drive when you are no longer taking prescription pain medication, you can comfortably wear a seatbelt, and you can safely maneuver your car and apply brakes. b. RETURN TO WORK:  __________________________________________________________ 9. You should see your doctor in the office for a follow-up appointment approximately 2-3 weeks after your surgery.  Make sure that you call for this appointment within a day or two after you arrive home to insure a convenient appointment time. 10. OTHER INSTRUCTIONS:  __________________________________________________________________________________________________________________________________________________________________________________________  WHEN TO CALL YOUR DOCTOR: 1. Fever over 101.0 2. Inability to urinate 3. Nausea and/or vomiting 4. Extreme swelling or bruising 5. Continued bleeding from incision. 6. Increased pain, redness, or drainage from the incision  The clinic staff is available to answer your questions during regular business hours.  Please don't hesitate to call and ask to speak to one of the nurses for clinical concerns.  If you have a medical emergency, go to the nearest emergency room or call 911.  A surgeon from [facility] is always on call at the hospital     [address], [address], [place], [place]  [address] ?  [address] [address], [place], [place]   [address] [phone] ? [phone] ? FAX [phone] Web site:  [email or web address]  

## 2014-12-18 NOTE — H&P (Signed)
History of Present Illness Marland Kitchen T. Judyann Casasola MD; 11/29/2014 11:51 AM) The patient is a 74 year old male who presents with an inguinal hernia. Patient is known to me from previous right inguinal hernia repair about 11 years ago. In recent months he has developed a bulge in his left groin which is intermittently painful with burning and stinging pain. Worse with activity. He has no previous repairs on the left. No GI symptoms. He has had cardiac surgery with bypass and valve surgery about a year ago and has done well with this. He recently saw Dr. Mare Ferrari who knows he is anticipating surgery.   Other Problems Elbert Ewings, CMA; 11/29/2014 11:23 AM) Arthritis Bladder Problems Enlarged Prostate Heart murmur Inguinal Hernia  Past Surgical History Elbert Ewings, CMA; 11/29/2014 11:23 AM) Open Inguinal Hernia Surgery Right.  Diagnostic Studies History Elbert Ewings, [place]; 11/29/2014 11:23 AM) Colonoscopy never  Allergies Elbert Ewings, CMA; 11/29/2014 11:23 AM) OxyCODONE HCl ER *ANALGESICS - OPIOID*  Medication History Elbert Ewings, CMA; 11/29/2014 11:25 AM) Lisinopril (5MG  Tablet, Oral) Active. Aspirin (81MG  Tablet, Oral daily) Active. Brimonidine Tartrate (0.15% Solution, Ophthalmic) Active. Latanoprost (0.005% Solution, Ophthalmic) Active. Methylsulfonylmethane Active. Probiotic Product (Oral) Active. Medications Reconciled  Social History Elbert Ewings, [place]; 11/29/2014 11:23 AM) Caffeine use Tea. No alcohol use No drug use Tobacco use Never smoker.  Family History Elbert Ewings, [place]; 11/29/2014 11:23 AM) Heart Disease Mother. Heart disease in male family member before age 23 Prostate Cancer Father.    Review of Systems Elbert Ewings CMA; 11/29/2014 11:23 AM) General Present- Fatigue. Not Present- Appetite Loss, Chills, Fever, Night Sweats, Weight Gain and Weight Loss. Skin Not Present- Change in Wart/Mole, Dryness, Hives, Jaundice, New Lesions,  Non-Healing Wounds, Rash and Ulcer. HEENT Present- Hearing Loss and Wears glasses/contact lenses. Not Present- Earache, Hoarseness, Nose Bleed, Oral Ulcers, Ringing in the Ears, Seasonal Allergies, Sinus Pain, Sore Throat, Visual Disturbances and Yellow Eyes. Male Genitourinary Present- Nocturia. Not Present- Blood in Urine, Change in Urinary Stream, Frequency, Impotence, Painful Urination, Urgency and Urine Leakage.  Vitals Elbert Ewings CMA; 11/29/2014 11:25 AM) 11/29/2014 11:25 AM Weight: 170 lb Height: 71in Body Surface Area: 1.97 m Body Mass Index: 23.71 kg/m  Temp.: 97.72F(Temporal)  Pulse: 80 (Regular)  BP: 128/78 (Sitting, Left Arm, Standard)       Physical Exam Marland Kitchen T. Veera Stapleton MD; 11/29/2014 11:52 AM) The physical exam findings are as follows: Note:General: Alert, well-developed and well nourished Caucasian male, in no distress Skin: Warm and dry without rash or infection. HEENT: No palpable masses or thyromegaly. Sclera nonicteric. Pupils equal round and reactive. Lymph nodes: No cervical, supraclavicular, or inguinal nodes palpable. Lungs: Breath sounds clear and equal. No wheezing or increased work of breathing. Cardiovascular: Regular rate and rhythm. 2/6 systolic murmur. No JVD or edema. Abdomen: Nondistended. Soft and nontender. No masses palpable. No organomegaly. Well-healed incision in the right groin with no evidence of recurrence. There is a moderate-sized tender reducible left inguinal hernia. Extremities: No edema or joint swelling or deformity. No chronic venous stasis changes. Neurologic: Alert and fully oriented. Gait normal. No focal weakness. Psychiatric: Normal mood and affect. Thought content appropriate with normal judgement and insight    Assessment & Plan Marland Kitchen T. Harlean Regula MD; 11/29/2014 11:54 AM) LEFT INGUINAL HERNIA (K40.90) Impression: Primary left inguinal hernia. This is symptomatic and should be repaired. We'll obtain  cardiac clearance but this does not appear to be an issue. He does have a history of urinary retention postoperatively. I discussed using perioperative Flomax but  he does not like to use medications and knows how to self catheterize. He prefers a minimal anesthesia and did well local anesthesia and sedation last time and this is what he prefers. He was given literature regarding hernia repair. We discussed the procedure and risks including anesthetic complications, bleeding, infection, recurrence and chronic pain. All his questions were answered. Current Plans Pt Education - Pamphlet Given - Hernia Surgery: discussed with patient and provided information. Schedule for Surgery Open repair left inguinal hernia with mesh as an outpatient under local anesthesia with sedation

## 2014-12-18 NOTE — Anesthesia Preprocedure Evaluation (Addendum)
Anesthesia Evaluation  Patient identified by MRN, date of birth, ID band Patient awake    Reviewed: Allergy & Precautions, H&P , NPO status , Patient's Chart, lab work & pertinent test results  Airway Mallampati: III  TM Distance: >3 FB Neck ROM: Full    Dental no notable dental hx. (+) Teeth Intact, Dental Advisory Given   Pulmonary neg pulmonary ROS,    Pulmonary exam normal breath sounds clear to auscultation       Cardiovascular hypertension, Pt. on medications  Rhythm:Regular Rate:Normal  S/p AVR   Neuro/Psych negative neurological ROS  negative psych ROS   GI/Hepatic negative GI ROS, Neg liver ROS,   Endo/Other  negative endocrine ROS  Renal/GU negative Renal ROS  negative genitourinary   Musculoskeletal  (+) Arthritis , Osteoarthritis,    Abdominal   Peds  Hematology negative hematology ROS (+)   Anesthesia Other Findings   Reproductive/Obstetrics negative OB ROS                            Anesthesia Physical Anesthesia Plan  ASA: II  Anesthesia Plan: Regional and MAC   Post-op Pain Management:    Induction: Intravenous  Airway Management Planned: Simple Face Mask  Additional Equipment:   Intra-op Plan:   Post-operative Plan:   Informed Consent: I have reviewed the patients History and Physical, chart, labs and discussed the procedure including the risks, benefits and alternatives for the proposed anesthesia with the patient or authorized representative who has indicated his/her understanding and acceptance.   Dental advisory given  Plan Discussed with: CRNA  Anesthesia Plan Comments:        Anesthesia Quick Evaluation

## 2014-12-18 NOTE — Anesthesia Postprocedure Evaluation (Signed)
Anesthesia Post Note  Patient: [name] [name]  Procedure(s) Performed: Procedure(s) (LRB): OPEN LEFT INGUINAL HERNIA REPAIR (Left) INSERTION OF MESH (Left)  Patient location during evaluation: PACU Anesthesia Type: MAC Level of consciousness: awake and alert Pain management: pain level controlled Vital Signs Assessment: post-procedure vital signs reviewed and stable Respiratory status: spontaneous breathing, nonlabored ventilation and respiratory function stable Cardiovascular status: stable and blood pressure returned to baseline Anesthetic complications: no    Last Vitals:  Filed Vitals:   12/18/14 1546 12/18/14 1551  BP: 123/90 130/74  Pulse: 64 63  Temp:  36.3 C  Resp: 21 22    Last Pain:  Filed Vitals:   12/18/14 1552  PainSc: 0-No pain                 Arienne Gartin,W. EDMOND

## 2014-12-18 NOTE — Anesthesia Procedure Notes (Addendum)
Anesthesia Regional Block:  TAP block  Pre-Anesthetic Checklist: ,, timeout performed, Correct Patient, Correct Site, Correct Laterality, Correct Procedure, Correct Position, site marked, Risks and benefits discussed, pre-op evaluation, post-op pain management  Laterality: Left  Prep: Maximum Sterile Barrier Precautions used and chloraprep       Needles:  Injection technique: Single-shot  Needle Type: Echogenic Stimulator Needle     Needle Length: 9cm 9 cm Needle Gauge: 21 and 21 G    Additional Needles:  Procedures: ultrasound guided (picture in chart) TAP block Narrative:  Start time: 12/18/2014 1:25 PM End time: 12/18/2014 1:35 PM Injection made incrementally with aspirations every 5 mL. Anesthesiologist: Roderic Palau  Additional Notes: 2% Lidocaine skin wheel.

## 2014-12-18 NOTE — Transfer of Care (Signed)
Immediate Anesthesia Transfer of Care Note  Patient: [name] [name]  Procedure(s) Performed: Procedure(s): OPEN LEFT INGUINAL HERNIA REPAIR (Left) INSERTION OF MESH (Left)  Patient Location: PACU  Anesthesia Type:MAC and MAC combined with regional for post-op pain  Level of Consciousness: sedated  Airway & Oxygen Therapy: Patient Spontanous Breathing and Patient connected to face mask oxygen  Post-op Assessment: Report given to RN and Post -op Vital signs reviewed and stable  Post vital signs: Reviewed and stable  Last Vitals:  Filed Vitals:   12/18/14 1115  BP: 126/78  Pulse: 54  Temp: 36.8 C  Resp: 18    Complications: No apparent anesthesia complications

## 2014-12-19 ENCOUNTER — Encounter (HOSPITAL_COMMUNITY): Payer: Self-pay | Admitting: General Surgery

## 2014-12-21 ENCOUNTER — Ambulatory Visit: Payer: Medicare Other | Admitting: Cardiology

## 2015-02-20 DIAGNOSIS — H401233 Low-tension glaucoma, bilateral, severe stage: Secondary | ICD-10-CM | POA: Diagnosis not present

## 2015-03-26 ENCOUNTER — Institutional Professional Consult (permissible substitution): Payer: Medicare Other | Admitting: Cardiovascular Disease

## 2015-03-26 ENCOUNTER — Encounter: Payer: Self-pay | Admitting: *Deleted

## 2015-05-13 ENCOUNTER — Ambulatory Visit (INDEPENDENT_AMBULATORY_CARE_PROVIDER_SITE_OTHER): Payer: Medicare Other | Admitting: Cardiovascular Disease

## 2015-05-13 ENCOUNTER — Encounter: Payer: Self-pay | Admitting: Cardiovascular Disease

## 2015-05-13 VITALS — BP 118/82 | HR 58 | Ht 71.0 in | Wt 169.0 lb

## 2015-05-13 DIAGNOSIS — E7800 Pure hypercholesterolemia, unspecified: Secondary | ICD-10-CM

## 2015-05-13 DIAGNOSIS — I350 Nonrheumatic aortic (valve) stenosis: Secondary | ICD-10-CM

## 2015-05-13 DIAGNOSIS — I493 Ventricular premature depolarization: Secondary | ICD-10-CM

## 2015-05-13 DIAGNOSIS — Z954 Presence of other heart-valve replacement: Secondary | ICD-10-CM

## 2015-05-13 DIAGNOSIS — Z951 Presence of aortocoronary bypass graft: Secondary | ICD-10-CM | POA: Diagnosis not present

## 2015-05-13 DIAGNOSIS — I10 Essential (primary) hypertension: Secondary | ICD-10-CM

## 2015-05-13 NOTE — Assessment & Plan Note (Signed)
Blood pressure is well controlled on today's visit. No changes made to the medications. 

## 2015-05-13 NOTE — Patient Instructions (Signed)
You are doing well. No medication changes were made.  Please call us if you have new issues that need to be addressed before your next appt.  Your physician wants you to follow-up in: 6 months.  You will receive a reminder letter in the mail two months in advance. If you don't receive a letter, please call our office to schedule the follow-up appointment.   

## 2015-05-13 NOTE — Assessment & Plan Note (Signed)
Asymptomatic PVCs, no change to his medications at this time. No further workup needed

## 2015-05-13 NOTE — Assessment & Plan Note (Signed)
Echocardiogram in 2016, recommended he think about repeat echocardiogram in a year or 2 Well-functioning aortic valve    Total encounter time more than 25 minutes  Greater than 50% was spent in counseling and coordination of care with the patient

## 2015-05-13 NOTE — Assessment & Plan Note (Signed)
Long discussion concerning his underlying coronary artery disease, recent bypass surgery. CT scan from May 2016 reviewed with him showing coronary calcification Strongly recommended he think about starting a statin

## 2015-05-13 NOTE — Assessment & Plan Note (Signed)
Long discussion concerning cholesterol levels, ideal numbers. Ideally would need a 50 point LDL drop He has indicated that his wife is not particularly eager for him to be on a statin Other options also discussed including Zetia

## 2015-05-13 NOTE — Progress Notes (Signed)
Patient ID: [name] [name], male    DOB: [birth year], 75 y.o.   MRN: [id number]  HPI Comments: [name] [name] is a pleasant 75 year old gentleman with a history of coronary artery disease, bypass surgery December 2015 with bioprosthetic aortic valve who presents to establish care in the [facility] office  Previously seen in [place] Notes indicating prior aortic valve genera 2005, redo December 2015 Also with history of hyperlipidemia, In the past has declined cholesterol medication  On his visit today, reports having severe right hip pain Unable to exercise or stay active secondary to symptoms, otherwise feels well with no complaints of anginal pain Denies any leg edema, shortness of breath on exertion No prior smoking history per the patient Daughter presents to the office with him today,  CT scan of the chest from May 2016 pulled up in the office showing diffuse heavy LAD and circumflex one area atherosclerosis, minimal in the aorta  EKG on today's visit shows normal sinus rhythm with rate 58 beats minute, rare PVC, left axis deviation  Other past medical history October 2015 when he began to experience symptoms of increased dyspnea and orthopnea.   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 His left ventricle was dilated and his ejection fraction had fallen to 30-35%.    cardiac catheterization and then underwent a redo aortic valve replacement with a pericardial tissue valve as well as two-vessel coronary artery bypass graft surgery by Dr. Servando Snare on 12/27/13. The patient had postoperative atrial fibrillation.  He was discharged home on amiodarone    Allergies  Allergen Reactions  . Oxycodone Other (See Comments)    Hallucinations    Current Outpatient Prescriptions on File Prior to Visit  Medication Sig Dispense Refill  . amoxicillin (AMOXIL) 500 MG tablet Take 2,000 mg by mouth  as needed (1 hour prior to dental procedures).    Marland Kitchen aspirin 325 MG tablet Take 162.5 mg by mouth daily.    . brimonidine (ALPHAGAN) 0.2 % ophthalmic solution Place 1 drop into both eyes 2 (two) times daily.    Marland Kitchen HYDROcodone-acetaminophen (NORCO/VICODIN) 5-325 MG tablet Take 1-2 tablets by mouth every 4 (four) hours as needed for moderate pain or severe pain. 40 tablet 0  . latanoprost (XALATAN) 0.005 % ophthalmic solution Place 1 drop into both eyes at bedtime.     Marland Kitchen lisinopril (PRINIVIL,ZESTRIL) 5 MG tablet Take 1 tablet (5 mg total) by mouth daily. 90 tablet 3  . Methylsulfonylmethane (MSM PO) Take 2 capsules by mouth 3 (three) times daily.    . Misc Natural Products (SAW PALMETTO) CAPS Take 1 capsule by mouth daily.    . Misc Natural Products (TURMERIC CURCUMIN) CAPS Take 1 capsule by mouth daily.    . Probiotic Product (PROBIOTIC DAILY PO) Take 1 tablet by mouth daily.     No current facility-administered medications on file prior to visit.    Past Medical History  Diagnosis Date  . Aortic stenosis   . Hyperlipidemia   . Heart murmur   . Glaucoma     uses eye drops daily  . CHF (congestive heart failure) (HCC)     takes Furosemide daily  . Arthritis     Past Surgical History  Procedure Laterality Date  . Cardiac catheterization  02/08/2003    severe aortic stenosis,normal left ventricular function  . 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 Inguinal hernia  repair  04/2003  . Left and right heart catheterization with coronary angiogram N/A 12/18/2013    Procedure: LEFT AND RIGHT HEART CATHETERIZATION WITH CORONARY ANGIOGRAM;  Surgeon: Peter M Martinique, MD;  Location: [facility] CATH LAB;  Service: Cardiovascular;  Laterality: N/A;  . Aortic valve replacement N/A 12/27/2013    Procedure: REDO AORTIC VALVE REPLACEMENT WITH 23MM MAGNA EASE PERICARDIAL TISSUE VALVE;  Surgeon: Grace Isaac, MD;  Location: [place];  Service: Open Heart Surgery;   Laterality: N/A;  . Tee without cardioversion N/A 12/27/2013    Procedure: TRANSESOPHAGEAL ECHOCARDIOGRAM (TEE);  Surgeon: Grace Isaac, MD;  Location: [place];  Service: Open Heart Surgery;  Laterality: N/A;  . Coronary artery bypass graft N/A 12/27/2013    Procedure: CORONARY ARTERY BYPASS GRAFTING TIMES TWO USING RIGHT LEG SAPHENOUS VEIN TO RIGHT CORONARY ARTERY AND LEFT INTERNAL MAMMARY ARTERY TO LEFT ANTERIOR DESCENDING ARTERY;  Surgeon: Grace Isaac, MD;  Location: [place];  Service: Open Heart Surgery;  Laterality: N/A;  . Inguinal hernia repair Left 12/18/2014    Procedure: OPEN LEFT INGUINAL HERNIA REPAIR;  Surgeon: Excell Seltzer, MD;  Location: [facility];  Service: General;  Laterality: Left;  . Insertion of mesh Left 12/18/2014    Procedure: INSERTION OF MESH;  Surgeon: Excell Seltzer, MD;  Location: [facility];  Service: General;  Laterality: Left;    Social History  reports that he has never smoked. He has never used smokeless tobacco. He reports that he does not drink alcohol or use illicit drugs.  Family History family history includes Cancer in his father; Heart disease in his mother.   Review of Systems  Constitutional: Negative.   Respiratory: Negative.   Cardiovascular: Negative.   Gastrointestinal: Negative.   Musculoskeletal: Positive for arthralgias.  Neurological: Negative.   Hematological: Negative.   Psychiatric/Behavioral: Negative.   All other systems reviewed and are negative.   BP 118/82 mmHg  Pulse 58  Ht 5\' 11"  (1.803 m)  Wt 169 lb (76.658 kg)  BMI 23.58 kg/m2   Physical Exam  Constitutional: He is oriented to person, place, and time. He appears well-developed and well-nourished.  HENT:  Head: Normocephalic.  Nose: Nose normal.  Mouth/Throat: Oropharynx is clear and moist.  Eyes: Conjunctivae are normal. Pupils are equal, round, and reactive to light.  Neck: Normal range of motion. Neck supple. No JVD present.  Cardiovascular: Normal  rate, regular rhythm, normal heart sounds and intact distal pulses.  Exam reveals no gallop and no friction rub.   No murmur heard. Pulmonary/Chest: Effort normal and breath sounds normal. No respiratory distress. He has no wheezes. He has no rales. He exhibits no tenderness.  Abdominal: Soft. Bowel sounds are normal. He exhibits no distension. There is no tenderness.  Musculoskeletal: Normal range of motion. He exhibits no edema or tenderness.  Lymphadenopathy:    He has no cervical adenopathy.  Neurological: He is alert and oriented to person, place, and time. Coordination normal.  Skin: Skin is warm and dry. No rash noted. No erythema.  Psychiatric: He has a normal mood and affect. His behavior is normal. Judgment and thought content normal.

## 2015-07-25 DIAGNOSIS — M1611 Unilateral primary osteoarthritis, right hip: Secondary | ICD-10-CM | POA: Diagnosis not present

## 2015-07-26 ENCOUNTER — Encounter: Payer: Self-pay | Admitting: Cardiovascular Disease

## 2015-07-26 NOTE — Progress Notes (Signed)
Cardiac clearance form placed in Dr. Donivan Scull desk. Patient was last seen 05/13/2015.

## 2015-08-12 ENCOUNTER — Telehealth: Payer: Self-pay | Admitting: Cardiovascular Disease

## 2015-08-12 NOTE — Telephone Encounter (Signed)
We have not received cardiac clearance request for pt.

## 2015-08-12 NOTE — Telephone Encounter (Signed)
Pt daughter calling requesting status of clearance for hip surgery for Dr. Gladstone Lighter. Please call. States dr. Gabriel Carina faxed form on Friday.

## 2015-08-23 ENCOUNTER — Telehealth: Payer: Self-pay | Admitting: Cardiovascular Disease

## 2015-08-23 DIAGNOSIS — H401233 Low-tension glaucoma, bilateral, severe stage: Secondary | ICD-10-CM | POA: Diagnosis not present

## 2015-08-23 NOTE — Telephone Encounter (Signed)
Received cardiac clearance from [facility]. No surgery date listed on form. Placed on Dr. Donivan Scull desk for his review.

## 2015-09-02 NOTE — Telephone Encounter (Signed)
Valora Corporal, RN      08/23/15 8:21 AM  Note    Received cardiac clearance from [facility]. No surgery date listed on form. Placed on Dr. Donivan Scull desk for his review.

## 2015-09-02 NOTE — Telephone Encounter (Signed)
Pt daughter is calling back regarding pt surgical clearance. Please call and advise of status.

## 2015-09-03 NOTE — Telephone Encounter (Signed)
Would call to confirm no new cardiac issues/angina If no new sx, he is acceptable risk for hip surgery No further testing needed

## 2015-09-04 NOTE — Telephone Encounter (Signed)
Spoke with patients daughter and they report no new cardiac issues at this time. Reviewed with her Dr. Gwenyth Ober recommendations regarding cardiac clearance and she verbalized understanding with no further questions at this time. Will route this message over to [facility].

## 2015-09-25 ENCOUNTER — Encounter (HOSPITAL_COMMUNITY): Payer: Medicare Other

## 2015-09-30 NOTE — Progress Notes (Signed)
Pt has preop appt scheduler for 10/03/2015; please place surgical orders in epic. Thanks.

## 2015-10-01 NOTE — Telephone Encounter (Signed)
This was not sent to you. [facility] requesting cardiac clearance for pt to proceed w/ Rt hip: THA w/wo autograft/allograft w/ Dr. Latanya Maudlin on 10/09/15. Please route clearance to [phone].

## 2015-10-01 NOTE — Patient Instructions (Addendum)
[name] [name]  10/01/2015   Your procedure is scheduled on: Wednesday 10/09/2015  Report to [facility] Main  Entrance take [facility]  elevators to 3rd floor to  [place] at  1030  AM.  Call this number if you have problems the morning of surgery [phone]   Remember: ONLY 1 PERSON MAY GO WITH YOU TO SHORT STAY TO GET  READY MORNING OF [place].   Do not eat food or drink liquids :After Midnight.     Take these medicines the morning of surgery with A SIP OF WATER: use Alphagan eye drops and other eye drops                                 You may not have any metal on your body including hair pins and              piercings  Do not wear jewelry, make-up, lotions, powders or perfumes, deodorant             Do not wear nail polish.  Do not shave  48 hours prior to surgery.              Men may shave face and neck.   Do not bring valuables to the hospital. [place].  Contacts, dentures or bridgework may not be worn into surgery.  Leave suitcase in the car. After surgery it may be brought to your room.                 Please read over the following fact sheets you were given: _____________________________________________________________________             [facility] - Preparing for Surgery Before surgery, you can play an important role.  Because skin is not sterile, your skin needs to be as free of germs as possible.  You can reduce the number of germs on your skin by washing with CHG (chlorahexidine gluconate) soap before surgery.  CHG is an antiseptic cleaner which kills germs and bonds with the skin to continue killing germs even after washing. Please DO NOT use if you have an allergy to CHG or antibacterial soaps.  If your skin becomes reddened/irritated stop using the CHG and inform your nurse when you arrive at Short Stay. Do not shave (including legs and underarms) for at least 48 hours prior  to the first CHG shower.  You may shave your face/neck. Please follow these instructions carefully:  1.  Shower with CHG Soap the night before surgery and the  morning of Surgery.  2.  If you choose to wash your hair, wash your hair first as usual with your  normal  shampoo.  3.  After you shampoo, rinse your hair and body thoroughly to remove the  shampoo.                           4.  Use CHG as you would any other liquid soap.  You can apply chg directly  to the skin and wash                       Gently with a  scrungie or clean washcloth.  5.  Apply the CHG Soap to your body ONLY FROM THE NECK DOWN.   Do not use on face/ open                           Wound or open sores. Avoid contact with eyes, ears mouth and genitals (private parts).                       Wash face,  Genitals (private parts) with your normal soap.             6.  Wash thoroughly, paying special attention to the area where your surgery  will be performed.  7.  Thoroughly rinse your body with warm water from the neck down.  8.  DO NOT shower/wash with your normal soap after using and rinsing off  the CHG Soap.                9.  Pat yourself dry with a clean towel.            10.  Wear clean pajamas.            11.  Place clean sheets on your bed the night of your first shower and do not  sleep with pets. Day of Surgery : Do not apply any lotions/deodorants the morning of surgery.  Please wear clean clothes to the hospital/surgery center.  FAILURE TO FOLLOW THESE INSTRUCTIONS MAY RESULT IN THE CANCELLATION OF YOUR SURGERY PATIENT SIGNATURE_________________________________  NURSE SIGNATURE__________________________________  ________________________________________________________________________   Adam Phenix  An incentive spirometer is a tool that can help keep your lungs clear and active. This tool measures how well you are filling your lungs with each breath. Taking long deep breaths may help reverse or  decrease the chance of developing breathing (pulmonary) problems (especially infection) following:  A long period of time when you are unable to move or be active. BEFORE THE PROCEDURE   If the spirometer includes an indicator to show your best effort, your nurse or respiratory therapist will set it to a desired goal.  If possible, sit up straight or lean slightly forward. Try not to slouch.  Hold the incentive spirometer in an upright position. INSTRUCTIONS FOR USE  1. Sit on the edge of your bed if possible, or sit up as far as you can in bed or on a chair. 2. Hold the incentive spirometer in an upright position. 3. Breathe out normally. 4. Place the mouthpiece in your mouth and seal your lips tightly around it. 5. Breathe in slowly and as deeply as possible, raising the piston or the ball toward the top of the column. 6. Hold your breath for 3-5 seconds or for as long as possible. Allow the piston or ball to fall to the bottom of the column. 7. Remove the mouthpiece from your mouth and breathe out normally. 8. Rest for a few seconds and repeat Steps 1 through 7 at least 10 times every 1-2 hours when you are awake. Take your time and take a few normal breaths between deep breaths. 9. The spirometer may include an indicator to show your best effort. Use the indicator as a goal to work toward during each repetition. 10. After each set of 10 deep breaths, practice coughing to be sure your lungs are clear. If you have an incision (the cut made at the time of surgery), support your incision  when coughing by placing a pillow or rolled up towels firmly against it. Once you are able to get out of bed, walk around indoors and cough well. You may stop using the incentive spirometer when instructed by your caregiver.  RISKS AND COMPLICATIONS  Take your time so you do not get dizzy or light-headed.  If you are in pain, you may need to take or ask for pain medication before doing incentive spirometry.  It is harder to take a deep breath if you are having pain. AFTER USE  Rest and breathe slowly and easily.  It can be helpful to keep track of a log of your progress. Your caregiver can provide you with a simple table to help with this. If you are using the spirometer at home, follow these instructions: [place] IF:   You are having difficultly using the spirometer.  You have trouble using the spirometer as often as instructed.  Your pain medication is not giving enough relief while using the spirometer.  You develop fever of 100.5 F (38.1 C) or higher. SEEK IMMEDIATE MEDICAL CARE IF:   You cough up bloody sputum that had not been present before.  You develop fever of 102 F (38.9 C) or greater.  You develop worsening pain at or near the incision site. MAKE SURE YOU:   Understand these instructions.  Will watch your condition.  Will get help right away if you are not doing well or get worse. Document Released: 05/11/2006 Document Revised: 03/23/2011 Document Reviewed: 07/12/2006 ExitCare Patient Information 2014 ExitCare, [place].   ________________________________________________________________________  WHAT IS A BLOOD TRANSFUSION? Blood Transfusion Information  A transfusion is the replacement of blood or some of its parts. Blood is made up of multiple cells which provide different functions.  Red blood cells carry oxygen and are used for blood loss replacement.  White blood cells fight against infection.  Platelets control bleeding.  Plasma helps clot blood.  Other blood products are available for specialized needs, such as hemophilia or other clotting disorders. BEFORE THE TRANSFUSION  Who gives blood for transfusions?   Healthy volunteers who are fully evaluated to make sure their blood is safe. This is blood bank blood. Transfusion therapy is the safest it has ever been in the practice of medicine. Before blood is taken from a donor, a complete  history is taken to make sure that person has no history of diseases nor engages in risky social behavior (examples are intravenous drug use or sexual activity with multiple partners). The donor's travel history is screened to minimize risk of transmitting infections, such as malaria. The donated blood is tested for signs of infectious diseases, such as HIV and hepatitis. The blood is then tested to be sure it is compatible with you in order to minimize the chance of a transfusion reaction. If you or a relative donates blood, this is often done in anticipation of surgery and is not appropriate for emergency situations. It takes many days to process the donated blood. RISKS AND COMPLICATIONS Although transfusion therapy is very safe and saves many lives, the main dangers of transfusion include:   Getting an infectious disease.  Developing a transfusion reaction. This is an allergic reaction to something in the blood you were given. Every precaution is taken to prevent this. The decision to have a blood transfusion has been considered carefully by your caregiver before blood is given. Blood is not given unless the benefits outweigh the risks. AFTER THE TRANSFUSION  Right after  receiving a blood transfusion, you will usually feel much better and more energetic. This is especially true if your red blood cells have gotten low (anemic). The transfusion raises the level of the red blood cells which carry oxygen, and this usually causes an energy increase.  The nurse administering the transfusion will monitor you carefully for complications. HOME CARE INSTRUCTIONS  No special instructions are needed after a transfusion. You may find your energy is better. Speak with your caregiver about any limitations on activity for underlying diseases you may have. SEEK MEDICAL CARE IF:   Your condition is not improving after your transfusion.  You develop redness or irritation at the intravenous (IV) site. SEEK  IMMEDIATE MEDICAL CARE IF:  Any of the following symptoms occur over the next 12 hours:  Shaking chills.  You have a temperature by mouth above 102 F (38.9 C), not controlled by medicine.  Chest, back, or muscle pain.  People around you feel you are not acting correctly or are confused.  Shortness of breath or difficulty breathing.  Dizziness and fainting.  You get a rash or develop hives.  You have a decrease in urine output.  Your urine turns a dark color or changes to pink, red, or brown. Any of the following symptoms occur over the next 10 days:  You have a temperature by mouth above 102 F (38.9 C), not controlled by medicine.  Shortness of breath.  Weakness after normal activity.  The white part of the eye turns yellow (jaundice).  You have a decrease in the amount of urine or are urinating less often.  Your urine turns a dark color or changes to pink, red, or brown. Document Released: 12/27/1999 Document Revised: 03/23/2011 Document Reviewed: 08/15/2007 [facility] Patient Information 2014 [place], [place].  _______________________________________________________________________

## 2015-10-02 NOTE — Telephone Encounter (Signed)
Acceptable risk for orthopedic surgery Will need antibiotics prior to procedure given bioprosthetic valve No further testing needed

## 2015-10-03 ENCOUNTER — Encounter (HOSPITAL_COMMUNITY): Payer: Self-pay

## 2015-10-03 ENCOUNTER — Ambulatory Visit (HOSPITAL_COMMUNITY)
Admission: RE | Admit: 2015-10-03 | Discharge: 2015-10-03 | Disposition: A | Discharge status: Home / Self Care | Payer: Medicare Other | Source: Ambulatory Visit | Attending: Surgical | Admitting: Surgical

## 2015-10-03 ENCOUNTER — Encounter (HOSPITAL_COMMUNITY)
Admission: RE | Admit: 2015-10-03 | Discharge: 2015-10-03 | Disposition: A | Discharge status: Home / Self Care | Payer: Medicare Other | Source: Ambulatory Visit | Attending: Orthopedic Surgery | Admitting: Orthopedic Surgery

## 2015-10-03 DIAGNOSIS — Z01810 Encounter for preprocedural cardiovascular examination: Secondary | ICD-10-CM | POA: Insufficient documentation

## 2015-10-03 DIAGNOSIS — I4891 Unspecified atrial fibrillation: Secondary | ICD-10-CM | POA: Insufficient documentation

## 2015-10-03 DIAGNOSIS — Z01818 Encounter for other preprocedural examination: Secondary | ICD-10-CM | POA: Insufficient documentation

## 2015-10-03 DIAGNOSIS — Z01812 Encounter for preprocedural laboratory examination: Secondary | ICD-10-CM | POA: Diagnosis not present

## 2015-10-03 DIAGNOSIS — Z951 Presence of aortocoronary bypass graft: Secondary | ICD-10-CM | POA: Diagnosis not present

## 2015-10-03 HISTORY — DX: Atherosclerotic heart disease of native coronary artery without angina pectoris: I25.10

## 2015-10-03 HISTORY — DX: Essential (primary) hypertension: I10

## 2015-10-03 LAB — TYPE AND SCREEN
ABO/RH(D): A POS
Antibody Screen: POSITIVE
DAT, IgG: NEGATIVE

## 2015-10-03 LAB — URINALYSIS, ROUTINE W REFLEX MICROSCOPIC
Bilirubin Urine: NEGATIVE
Glucose, UA: NEGATIVE mg/dL
Hgb urine dipstick: NEGATIVE
Ketones, ur: NEGATIVE mg/dL
Nitrite: POSITIVE — AB
Protein, ur: NEGATIVE mg/dL
Specific Gravity, Urine: 1.011 (ref 1.005–1.030)
pH: 5.5 (ref 5.0–8.0)

## 2015-10-03 LAB — CBC WITH DIFFERENTIAL/PLATELET
Basophils Absolute: 0 10*3/uL (ref 0.0–0.1)
Basophils Relative: 0 %
Eosinophils Absolute: 0.1 10*3/uL (ref 0.0–0.7)
Eosinophils Relative: 1 %
HCT: 41.4 % (ref 39.0–52.0)
Hemoglobin: 13.8 g/dL (ref 13.0–17.0)
Lymphocytes Relative: 19 %
Lymphs Abs: 1.6 10*3/uL (ref 0.7–4.0)
MCH: 29.4 pg (ref 26.0–34.0)
MCHC: 33.3 g/dL (ref 30.0–36.0)
MCV: 88.1 fL (ref 78.0–100.0)
Monocytes Absolute: 0.7 10*3/uL (ref 0.1–1.0)
Monocytes Relative: 9 %
Neutro Abs: 6.1 10*3/uL (ref 1.7–7.7)
Neutrophils Relative %: 71 %
Platelets: 164 10*3/uL (ref 150–400)
RBC: 4.7 MIL/uL (ref 4.22–5.81)
RDW: 13.4 % (ref 11.5–15.5)
WBC: 8.6 10*3/uL (ref 4.0–10.5)

## 2015-10-03 LAB — COMPREHENSIVE METABOLIC PANEL
ALT: 21 U/L (ref 17–63)
AST: 18 U/L (ref 15–41)
Albumin: 4.1 g/dL (ref 3.5–5.0)
Alkaline Phosphatase: 77 U/L (ref 38–126)
Anion gap: 5 (ref 5–15)
BUN: 22 mg/dL — ABNORMAL HIGH (ref 6–20)
CO2: 26 mmol/L (ref 22–32)
Calcium: 8.9 mg/dL (ref 8.9–10.3)
Chloride: 106 mmol/L (ref 101–111)
Creatinine, Ser: 0.82 mg/dL (ref 0.61–1.24)
GFR calc Af Amer: >60 mL/min (ref >60)
GFR calc non Af Amer: >60 mL/min (ref >60)
Glucose, Bld: 86 mg/dL (ref 65–99)
Potassium: 4.7 mmol/L (ref 3.5–5.1)
Sodium: 137 mmol/L (ref 135–145)
Total Bilirubin: 0.7 mg/dL (ref 0.3–1.2)
Total Protein: 6.7 g/dL (ref 6.5–8.1)

## 2015-10-03 LAB — PROTIME-INR
INR: 0.97
Prothrombin Time: 12.9 seconds (ref 11.4–15.2)

## 2015-10-03 LAB — URINE MICROSCOPIC-ADD ON
RBC / HPF: NONE SEEN RBC/hpf (ref 0–5)
Squamous Epithelial / LPF: NONE SEEN

## 2015-10-03 LAB — SURGICAL PCR SCREEN
MRSA, PCR: INVALID — AB
STAPHYLOCOCCUS AUREUS: INVALID — AB

## 2015-10-03 LAB — APTT: aPTT: 27 seconds (ref 24–36)

## 2015-10-03 NOTE — Progress Notes (Addendum)
09/04/2015- Pre-operative clearance from Dr. Ida Rogue on chart. 05/13/2015- noted EKG in EPIC

## 2015-10-03 NOTE — Progress Notes (Signed)
   10/03/15 0918  OBSTRUCTIVE SLEEP APNEA  Have you ever been diagnosed with sleep apnea through a sleep study? No  Do you snore loudly (loud enough to be heard through closed doors)?  1  Do you often feel tired, fatigued, or sleepy during the daytime (such as falling asleep during driving or talking to someone)? 1  Has anyone observed you stop breathing during your sleep? 1  Do you have, or are you being treated for high blood pressure? 1  BMI more than 35 kg/m2? 0  Age > 50 (1-yes) 1  Neck circumference greater than:Male 16 inches or larger, Male 17inches or larger? 0  Male Gender (Yes=1) 1  Obstructive Sleep Apnea Score 6  Score 5 or greater  Results sent to PCP

## 2015-10-03 NOTE — Telephone Encounter (Signed)
Spoke with patients daughter and patient now has appointment for tomorrow because he is now in afib. Dr. Gladstone Lighter is aware of this as well. Let her know that when he comes in tomorrow they can discuss surgical clearance since there has been a change. Also let her know to ask about antibiotics at that time as well. She verbalized understanding of our discussion and had no further questions at this time.

## 2015-10-03 NOTE — Progress Notes (Signed)
Consulted Dr. Annye Asa, Anesthesia about EKG done today of new onset Atrial Fibrillation and per Dr. Glennon Mac, patient needs Cardiac Clearance.

## 2015-10-04 ENCOUNTER — Encounter: Payer: Self-pay | Admitting: Cardiovascular Disease

## 2015-10-04 ENCOUNTER — Ambulatory Visit (INDEPENDENT_AMBULATORY_CARE_PROVIDER_SITE_OTHER): Payer: Medicare Other | Admitting: Cardiovascular Disease

## 2015-10-04 VITALS — BP 98/62 | HR 78 | Resp 19 | Ht 71.0 in | Wt 172.5 lb

## 2015-10-04 DIAGNOSIS — I4819 Other persistent atrial fibrillation: Secondary | ICD-10-CM

## 2015-10-04 DIAGNOSIS — M1611 Unilateral primary osteoarthritis, right hip: Secondary | ICD-10-CM | POA: Diagnosis not present

## 2015-10-04 DIAGNOSIS — I481 Persistent atrial fibrillation: Secondary | ICD-10-CM

## 2015-10-04 DIAGNOSIS — E7800 Pure hypercholesterolemia, unspecified: Secondary | ICD-10-CM

## 2015-10-04 DIAGNOSIS — I10 Essential (primary) hypertension: Secondary | ICD-10-CM

## 2015-10-04 DIAGNOSIS — Z954 Presence of other heart-valve replacement: Secondary | ICD-10-CM

## 2015-10-04 DIAGNOSIS — Z951 Presence of aortocoronary bypass graft: Secondary | ICD-10-CM

## 2015-10-04 DIAGNOSIS — Z952 Presence of prosthetic heart valve: Secondary | ICD-10-CM

## 2015-10-04 MED ORDER — AMOXICILLIN 500 MG PO TABS: 2000.0000 mg | ORAL_TABLET | ORAL | 0 refills | Status: DC | PRN

## 2015-10-04 NOTE — Patient Instructions (Addendum)
Medication Instructions:   Please start eliquis one pill twice a day to prevent stroke from atrial fibrillation  Stop eliquis Monday, Tuesday, Wednesday, thursday, restart on Friday  Drink fluids for low blood pressure  Labwork:  No new labs needed  Testing/Procedures:  No further testing at this time   Follow-Up: It was a pleasure seeing you in the office today. Please call us if you have new issues that need to be addressed before your next appt.  [phone]  Your physician wants you to follow-up in: 5 to 6 weeks.    If you need a refill on your cardiac medications before your next appointment, please call your pharmacy.

## 2015-10-04 NOTE — Progress Notes (Signed)
Cardiology Office Note  Date:  10/04/2015   ID:  [name] [name], DOB [birth year], MRN [id number]  PCP:  [name] Sake, [name]   Chief Complaint  Patient presents with  . other    Hip surgery sched for 9/27.  Needs cardiac clearance.  Hip replacement    HPI:  [name] [name] is a pleasant 75 year old gentleman with a history of coronary artery disease, bypass surgery December 2015 with bioprosthetic aortic valve who presentsFor routine follow-up of his coronary disease, aortic valve prosthesis and for preoperative evaluation  Notes indicating prior aortic valve  surgery 2005, redo December 2015 history of hyperlipidemia, In the past has declined cholesterol medication despite diagnosis of coronary disease Most recent ejection fraction March 2016 was greater than 60%  Presents today with his daughter who does most of the talking for him  Recently seen in orthopedics, preop evaluation, found to have atrial fibrillation on routine EKG  He denies any symptoms of shortness of breath, food retention, chest tightness, palpitations . In general is asymptomatic from his atrial fibrillation Reports he is scheduled for Hip surgery on Wednesday, next week, September 27  Continues to have severe right hip pain Unable to exercise or stay active secondary to symptoms,  otherwise feels well with no complaints of anginal pain  EKG on today's visit shows atrial fibrillation with ventricular rate 78 bpm, PVCs, prolonged QTC  Other past medical history CT scan of the chest from May 2016 pulled up in the office showing diffuse heavy LAD and circumflex one area atherosclerosis, minimal in the aorta  Other past medical history October 2015 when he began to experience symptoms of increased dyspnea and orthopnea.   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 His left ventricle was dilated and his ejection fraction had  fallen to 30-35%.    cardiac catheterization and then underwent a redo aortic valve replacement with a pericardial tissue valve as well as two-vessel coronary artery bypass graft surgery by [name] [name] on 12/27/13. The patient had postoperative atrial fibrillation.  He was discharged home on amiodarone   PMH:   has a past medical history of Aortic stenosis; Arthritis; CHF (congestive heart failure) ([place]); Coronary artery disease; Glaucoma; Heart murmur; Hyperlipidemia; and Hypertension.  PSH:    Past Surgical History:  Procedure Laterality Date  . AORTIC VALVE REPLACEMENT N/A 12/27/2013   Procedure: REDO AORTIC VALVE REPLACEMENT WITH 23MM MAGNA EASE PERICARDIAL TISSUE VALVE;  Surgeon: [name] [name];  Location: [place];  Service: Open Heart Surgery;  Laterality: N/A;  . CARDIAC CATHETERIZATION  02/08/2003   severe aortic stenosis,normal left ventricular function  . CORONARY ARTERY BYPASS GRAFT N/A 12/27/2013   Procedure: CORONARY ARTERY BYPASS GRAFTING TIMES TWO USING RIGHT LEG SAPHENOUS VEIN TO RIGHT CORONARY ARTERY AND LEFT INTERNAL MAMMARY ARTERY TO LEFT ANTERIOR DESCENDING ARTERY;  Surgeon: [name] [name];  Location: [place];  Service: Open Heart Surgery;  Laterality: N/A;  . HERNIA REPAIR    . INGUINAL HERNIA REPAIR  04/2003  . INGUINAL HERNIA REPAIR Left 12/18/2014   Procedure: OPEN LEFT INGUINAL HERNIA REPAIR;  Surgeon: [name] [name];  Location: [facility];  Service: General;  Laterality: Left;  . INSERTION OF MESH Left 12/18/2014   Procedure: INSERTION OF MESH;  Surgeon: [name] [name];  Location: [facility];  Service: General;  Laterality: Left;  . LEFT AND RIGHT HEART CATHETERIZATION WITH CORONARY ANGIOGRAM N/A 12/18/2013   Procedure: LEFT AND  RIGHT HEART CATHETERIZATION WITH CORONARY ANGIOGRAM;  Surgeon: [name] [name];  Location: [facility] CATH LAB;  Service: Cardiovascular;  Laterality: N/A;  . TEE WITHOUT CARDIOVERSION N/A 12/27/2013   Procedure: TRANSESOPHAGEAL  ECHOCARDIOGRAM (TEE);  Surgeon: [name] [name];  Location: [place];  Service: Open Heart Surgery;  Laterality: N/A;  . TISSUE AORTIC VALVE REPLACEMENT  02/09/2003   # 25 pericardial tissue valve    Current Outpatient Prescriptions  Medication Sig Dispense Refill  . aspirin EC 81 MG tablet Take 81 mg by mouth daily.    . brimonidine (ALPHAGAN) 0.2 % ophthalmic solution Place 1 drop into both eyes 2 (two) times daily.    . Cranberry 400 MG CAPS Take 400 mg by mouth at bedtime.    . Glucos-Chondroit-Hyaluron-MSM (GLUCOSAMINE CHONDROITIN JOINT PO) Take 1 tablet by mouth 3 (three) times daily with meals.    . latanoprost (XALATAN) 0.005 % ophthalmic solution Place 1 drop into both eyes at bedtime.     Marland Kitchen lisinopril (PRINIVIL,ZESTRIL) 5 MG tablet Take 1 tablet (5 mg total) by mouth daily. (Patient taking differently: Take 5 mg by mouth daily after supper. ) 90 tablet 3  . Methylsulfonylmethane (MSM) 1000 MG CAPS Take 8,000-12,000 mg by mouth 4 (four) times daily.     . Omega-3 Fatty Acids (OMEGA-3 FISH OIL PO) Take 1 capsule by mouth 2 (two) times daily.    Marland Kitchen OVER THE COUNTER MEDICATION Take 400 mg by mouth daily. D-Mannose 400mg     . OVER THE COUNTER MEDICATION Take 1 capsule by mouth daily with lunch. AgilEase    . Probiotic Product (PROBIOTIC DAILY PO) Take 1 tablet by mouth daily.    . SAW PALMETTO-PUMPKIN SEED OIL PO Take 1 tablet by mouth daily.    . timolol (BETIMOL) 0.5 % ophthalmic solution Place 1 drop into both eyes every morning.    . TURMERIC CURCUMIN PO Take 1 capsule by mouth daily with lunch.    . vitamin C (ASCORBIC ACID) 500 MG tablet Take 500 mg by mouth 2 (two) times daily.    Marland Kitchen amoxicillin (AMOXIL) 500 MG tablet Take 4 tablets (2,000 mg total) by mouth as needed (1 hour prior to procedures). 4 tablet 0   No current facility-administered medications for this visit.      Allergies:   Oxycodone   Social History:  The patient  reports that he has never smoked. He has never  used smokeless tobacco. He reports that he does not drink alcohol or use drugs.   Family History:   family history includes Cancer in his father; Heart disease in his mother.    Review of Systems: Review of Systems  Constitutional: Negative.   Respiratory: Negative.   Cardiovascular: Negative.   Gastrointestinal: Negative.   Musculoskeletal: Positive for joint pain.  Neurological: Negative.   Psychiatric/Behavioral: Negative.   All other systems reviewed and are negative.    PHYSICAL EXAM: VS:  BP 105/62 (BP Location: Left Arm, Patient Position: Sitting, Cuff Size: Normal)   Pulse 78   Resp 19   Ht 5\' 11"  (1.803 m)   Wt 172 lb 8 oz (78.2 kg)   BMI 24.06 kg/m  , BMI Body mass index is 24.06 kg/m. GEN: Well nourished, well developed, in no acute distress  HEENT: normal  Neck: no JVD, carotid bruits, or masses Cardiac: RRR; no murmurs, rubs, or gallops,no edema  Respiratory:  clear to auscultation bilaterally, normal work of breathing GI: soft, nontender, nondistended, + BS MS: no  deformity or atrophy  Skin: warm and dry, no rash Neuro:  Strength and sensation are intact Psych: euthymic mood, full affect    Recent Labs: 11/23/2014: Magnesium 1.9 10/03/2015: ALT 21; BUN 22; Creatinine, Ser 0.82; Hemoglobin 13.8; Platelets 164; Potassium 4.7; Sodium 137    Lipid Panel Lab Results  Component Value Date   CHOL 187 07/13/2014   HDL 61.30 07/13/2014   LDLCALC 117 (H) 07/13/2014   TRIG 44.0 07/13/2014      Wt Readings from Last 3 Encounters:  10/04/15 172 lb 8 oz (78.2 kg)  10/03/15 171 lb (77.6 kg)  05/13/15 169 lb (76.7 kg)       ASSESSMENT AND PLAN:  S/P CABG x 2 Currently with no symptoms of angina. No further workup at this time. Continue current medication regimen.  S/P AVR We have recommended he had antibiotics prior to the procedure Case discussed with [facility] orthopedics today, they will provide antibiotics Prior to the procedure. If not  available, amoxicillin 2000 mg 1 could be 2 hours prior to surgery  Persistent atrial fibrillation (HCC) Recently diagnosed by EKG in preop Again in atrial fibrillation on today's visit Long discussion concerning atrial fibrillation, risk and benefit of being on anticoagulation After long discussion with patient's daughter, will start eliquis 5 mg twice a day for the next few days, will hold anticoagulation September 25 and will not restart until potentially September 29, after the surgery No rate controlling medications needed, current rate is in the 70s  Primary osteoarthritis of right hip Scheduled for total hip replacement next Wednesday Acceptable risk, no further testing needed  Systolic hypertension Blood pressure is well controlled on today's visit. No changes made to the medications.  Hypercholesterolemia In the past he has declined cholesterol medication This was not discussed today, we'll discuss again in follow-up   Total encounter time more than 25 minutes  Greater than 50% was spent in counseling and coordination of care with the patient   Disposition:   F/U  6 months   Signed, Esmond Plants, M.D., Ph.D. 10/04/2015  [place], [place]

## 2015-10-06 LAB — MRSA CULTURE: Culture: NOT DETECTED

## 2015-10-07 DIAGNOSIS — I4891 Unspecified atrial fibrillation: Secondary | ICD-10-CM | POA: Diagnosis not present

## 2015-10-07 DIAGNOSIS — Z6824 Body mass index (BMI) 24.0-24.9, adult: Secondary | ICD-10-CM | POA: Diagnosis not present

## 2015-10-07 DIAGNOSIS — M1611 Unilateral primary osteoarthritis, right hip: Secondary | ICD-10-CM | POA: Diagnosis not present

## 2015-10-07 DIAGNOSIS — H6120 Impacted cerumen, unspecified ear: Secondary | ICD-10-CM | POA: Diagnosis not present

## 2015-10-07 NOTE — Progress Notes (Signed)
Patient saw Cardiology for clearance on 10/04/2015.  Encounter in [place].

## 2015-10-08 NOTE — H&P (Signed)
TOTAL HIP ADMISSION H&P  Patient is admitted for right total hip arthroplasty.  Subjective:  Chief Complaint: right hip pain  HPI: [name] [name], 75 y.o. male, has a history of pain and functional disability in the right hip(s) due to arthritis and patient has failed non-surgical conservative treatments for greater than 12 weeks to include NSAID's and/or analgesics, corticosteriod injections, flexibility and strengthening excercises, use of assistive devices and activity modification.  Onset of symptoms was gradual starting 2 years ago with gradually worsening course since that time.The patient noted no past surgery on the right hip(s).  Patient currently rates pain in the right hip at 8 out of 10 with activity. Patient has night pain, worsening of pain with activity and weight bearing, pain that interfers with activities of daily living, pain with passive range of motion and crepitus. Patient has evidence of subchondral cysts, subchondral sclerosis, periarticular osteophytes and joint space narrowing by imaging studies. This condition presents safety issues increasing the risk of falls. There is no current active infection.  Patient Active Problem List   Diagnosis Date Noted  . Pain of right leg 07/13/2014  . S/P CABG x 2 04/05/2014  . Incidental right lung nodule, > 34mm and < 82mm 12/27/2013  . S/P AVR 12/27/2013  . Left ventricular systolic dysfunction [id number]  . PVC's (premature ventricular contractions) 12/23/2012  . PAC (premature atrial contraction) 12/23/2012  . Systolic hypertension [id number]  . H/O aortic valve replacement with tissue graft 10/12/2012  . Hypercholesterolemia 03/11/2011  . BPH associated with nocturia 03/11/2011  . Aortic stenosis   . Hypersomnia with sleep apnea, unspecified   . Glaucoma    Past Medical History:  Diagnosis Date  . Aortic stenosis   . Arthritis   . CHF (congestive heart failure) (HCC)    takes Furosemide daily  . Coronary artery disease    . Glaucoma    uses eye drops daily  . Heart murmur   . Hyperlipidemia   . Hypertension     Past Surgical History:  Procedure Laterality Date  . AORTIC VALVE REPLACEMENT N/A 12/27/2013   Procedure: REDO AORTIC VALVE REPLACEMENT WITH 23MM MAGNA EASE PERICARDIAL TISSUE VALVE;  Surgeon: Grace Isaac, MD;  Location: [place];  Service: Open Heart Surgery;  Laterality: N/A;  . CARDIAC CATHETERIZATION  02/08/2003   severe aortic stenosis,normal left ventricular function  . CORONARY ARTERY BYPASS GRAFT N/A 12/27/2013   Procedure: CORONARY ARTERY BYPASS GRAFTING TIMES TWO USING RIGHT LEG SAPHENOUS VEIN TO RIGHT CORONARY ARTERY AND LEFT INTERNAL MAMMARY ARTERY TO LEFT ANTERIOR DESCENDING ARTERY;  Surgeon: Grace Isaac, MD;  Location: [place];  Service: Open Heart Surgery;  Laterality: N/A;  . HERNIA REPAIR    . INGUINAL HERNIA REPAIR  04/2003  . INGUINAL HERNIA REPAIR Left 12/18/2014   Procedure: OPEN LEFT INGUINAL HERNIA REPAIR;  Surgeon: Excell Seltzer, MD;  Location: [facility];  Service: General;  Laterality: Left;  . INSERTION OF MESH Left 12/18/2014   Procedure: INSERTION OF MESH;  Surgeon: Excell Seltzer, MD;  Location: [facility];  Service: General;  Laterality: Left;  . LEFT AND RIGHT HEART CATHETERIZATION WITH CORONARY ANGIOGRAM N/A 12/18/2013   Procedure: LEFT AND RIGHT HEART CATHETERIZATION WITH CORONARY ANGIOGRAM;  Surgeon: Peter M Martinique, MD;  Location: [facility];  Service: Cardiovascular;  Laterality: N/A;  . TEE WITHOUT CARDIOVERSION N/A 12/27/2013   Procedure: TRANSESOPHAGEAL ECHOCARDIOGRAM (TEE);  Surgeon: Grace Isaac, MD;  Location: [place];  Service: Open Heart Surgery;  Laterality:  N/A;  . TISSUE AORTIC VALVE REPLACEMENT  02/09/2003   # 25 pericardial tissue valve     Current Outpatient Prescriptions:  .  brimonidine (ALPHAGAN) 0.2 % ophthalmic solution, Place 1 drop into both eyes 2 (two) times daily., Disp: , Rfl:  .  Cranberry 400 MG CAPS, Take 400 mg by mouth at  bedtime., Disp: , Rfl:  .  Glucos-Chondroit-Hyaluron-MSM (GLUCOSAMINE CHONDROITIN JOINT PO), Take 1 tablet by mouth 3 (three) times daily with meals., Disp: , Rfl:  .  latanoprost (XALATAN) 0.005 % ophthalmic solution, Place 1 drop into both eyes at bedtime. , Disp: , Rfl:  .  lisinopril (PRINIVIL,ZESTRIL) 5 MG tablet, Take 1 tablet (5 mg total) by mouth daily. (Patient taking differently: Take 5 mg by mouth daily after supper. ), Disp: 90 tablet, Rfl: 3 .  Methylsulfonylmethane (MSM) 1000 MG CAPS, Take 8,000-12,000 mg by mouth 4 (four) times daily. , Disp: , Rfl:  .  Omega-3 Fatty Acids (OMEGA-3 FISH OIL PO), Take 1 capsule by mouth 2 (two) times daily., Disp: , Rfl:  .  OVER THE COUNTER MEDICATION, Take 400 mg by mouth daily. D-Mannose 400mg , Disp: , Rfl:  .  OVER THE COUNTER MEDICATION, Take 1 capsule by mouth daily with lunch. AgilEase, Disp: , Rfl:  .  Probiotic Product (PROBIOTIC DAILY PO), Take 1 tablet by mouth daily., Disp: , Rfl:  .  SAW PALMETTO-PUMPKIN SEED OIL PO, Take 1 tablet by mouth daily., Disp: , Rfl:  .  timolol (BETIMOL) 0.5 % ophthalmic solution, Place 1 drop into both eyes every morning., Disp: , Rfl:  .  TURMERIC CURCUMIN PO, Take 1 capsule by mouth daily with lunch., Disp: , Rfl:  .  vitamin C (ASCORBIC ACID) 500 MG tablet, Take 500 mg by mouth 2 (two) times daily., Disp: , Rfl:  .  amoxicillin (AMOXIL) 500 MG tablet, Take 4 tablets (2,000 mg total) by mouth as needed (1 hour prior to procedures)., Disp: 4 tablet, Rfl: 0 .  aspirin EC 81 MG tablet, Take 81 mg by mouth daily., Disp: , Rfl:   Allergies  Allergen Reactions  . Oxycodone Other (See Comments)    Hallucinations    Social History  Substance Use Topics  . Smoking status: Never Smoker  . Smokeless tobacco: Never Used  . Alcohol use No    Family History  Problem Relation Age of Onset  . Heart disease Mother   . Cancer Father      Review of Systems  Constitutional: Negative.   HENT: Positive for  hearing loss. Negative for congestion, ear discharge, ear pain, nosebleeds, sore throat and tinnitus.   Eyes: Positive for blurred vision.  Respiratory: Negative.  Negative for stridor.   Cardiovascular: Negative.   Gastrointestinal: Negative.   Genitourinary: Positive for frequency. Negative for dysuria, flank pain, hematuria and urgency.       Frequency at night History of urinary retention  Musculoskeletal: Positive for back pain, joint pain, myalgias and neck pain. Negative for falls.  Skin: Negative.   Neurological: Negative.  Negative for headaches.  Endo/Heme/Allergies: Negative.   Psychiatric/Behavioral: Positive for memory loss. Negative for depression, hallucinations, substance abuse and suicidal ideas. The patient is not nervous/anxious and does not have insomnia.     Objective:  Physical Exam  Constitutional: He is oriented to person, place, and time. He appears well-developed. No distress.  HENT:  Head: Normocephalic and atraumatic.  Right Ear: External ear normal.  Left Ear: External ear normal.  Nose: Nose normal.  Mouth/Throat: Oropharynx is clear and moist.  Eyes: Conjunctivae and EOM are normal.  Neck: Normal range of motion. Neck supple.  Cardiovascular: Normal rate and intact distal pulses.  An irregularly irregular rhythm present.  Murmur heard.  Systolic murmur is present with a grade of 3/6  Respiratory: Effort normal and breath sounds normal. No respiratory distress. He has no wheezes.  GI: Soft. Bowel sounds are normal. He exhibits no distension. There is no tenderness.  Musculoskeletal:       Right hip: He exhibits decreased range of motion, decreased strength and crepitus. He exhibits no bony tenderness.       Left hip: Normal.       Right knee: Normal.       Left knee: Normal.  Neurological: He is alert and oriented to person, place, and time. He has normal strength. No sensory deficit.  Skin: No rash noted. He is not diaphoretic. No erythema.   Psychiatric: He has a normal mood and affect. His behavior is normal.    Vitals  Weight: 172 lb Height: 71in Body Surface Area: 1.98 m Body Mass Index: 23.99 kg/m  Pulse: 80 (Regular)  BP: 122/76 (Sitting, Left Arm, Standard)  Imaging Review Plain radiographs demonstrate severe degenerative joint disease of the right hip(s). The bone quality appears to be good for age and reported activity level.  Assessment/Plan:  End stage primary osteoarthritis, right hip(s)  The patient history, physical examination, clinical judgement of the provider and imaging studies are consistent with end stage degenerative joint disease of the right hip(s) and total hip arthroplasty is deemed medically necessary. The treatment options including medical management, injection therapy, arthroscopy and arthroplasty were discussed at length. The risks and benefits of total hip arthroplasty were presented and reviewed. The risks due to aseptic loosening, infection, stiffness, dislocation/subluxation,  thromboembolic complications and other imponderables were discussed.  The patient acknowledged the explanation, agreed to proceed with the plan and consent was signed. Patient is being admitted for inpatient treatment for surgery, pain control, PT, OT, prophylactic antibiotics, VTE prophylaxis, progressive ambulation and ADL's and discharge planning.The patient is planning to be discharged home with home health services    Note:PCP: Dr. Lisbeth Ply Cardio: Dr. Rockey Situ Home with wife and daughter Therapy Plans: home therapy then outpatient if needed     Ardeen Jourdain, [place]

## 2015-10-09 ENCOUNTER — Encounter (HOSPITAL_COMMUNITY)
Admission: RE | Disposition: A | Discharge status: Home Health | Payer: Self-pay | Source: Ambulatory Visit | Attending: Orthopedic Surgery

## 2015-10-09 ENCOUNTER — Inpatient Hospital Stay (HOSPITAL_COMMUNITY)
Admission: RE | Admit: 2015-10-09 | Discharge: 2015-10-12 | DRG: 470 | Disposition: A | Discharge status: Home Health | Payer: Medicare Other | Source: Ambulatory Visit | Attending: Orthopedic Surgery | Admitting: Orthopedic Surgery

## 2015-10-09 ENCOUNTER — Inpatient Hospital Stay (HOSPITAL_COMMUNITY): Payer: Medicare Other | Admitting: Anesthesiology

## 2015-10-09 ENCOUNTER — Encounter (HOSPITAL_COMMUNITY): Payer: Self-pay | Admitting: *Deleted

## 2015-10-09 ENCOUNTER — Inpatient Hospital Stay (HOSPITAL_COMMUNITY): Payer: Medicare Other

## 2015-10-09 DIAGNOSIS — I2510 Atherosclerotic heart disease of native coronary artery without angina pectoris: Secondary | ICD-10-CM | POA: Diagnosis not present

## 2015-10-09 DIAGNOSIS — H409 Unspecified glaucoma: Secondary | ICD-10-CM | POA: Diagnosis not present

## 2015-10-09 DIAGNOSIS — Z951 Presence of aortocoronary bypass graft: Secondary | ICD-10-CM | POA: Diagnosis not present

## 2015-10-09 DIAGNOSIS — Z79899 Other long term (current) drug therapy: Secondary | ICD-10-CM

## 2015-10-09 DIAGNOSIS — Z953 Presence of xenogenic heart valve: Secondary | ICD-10-CM

## 2015-10-09 DIAGNOSIS — Z96641 Presence of right artificial hip joint: Secondary | ICD-10-CM

## 2015-10-09 DIAGNOSIS — Z471 Aftercare following joint replacement surgery: Secondary | ICD-10-CM | POA: Diagnosis not present

## 2015-10-09 DIAGNOSIS — M1611 Unilateral primary osteoarthritis, right hip: Secondary | ICD-10-CM | POA: Diagnosis not present

## 2015-10-09 DIAGNOSIS — Z96649 Presence of unspecified artificial hip joint: Secondary | ICD-10-CM

## 2015-10-09 DIAGNOSIS — M25551 Pain in right hip: Secondary | ICD-10-CM | POA: Diagnosis present

## 2015-10-09 DIAGNOSIS — I10 Essential (primary) hypertension: Secondary | ICD-10-CM | POA: Diagnosis present

## 2015-10-09 DIAGNOSIS — Z7982 Long term (current) use of aspirin: Secondary | ICD-10-CM

## 2015-10-09 DIAGNOSIS — I509 Heart failure, unspecified: Secondary | ICD-10-CM | POA: Diagnosis not present

## 2015-10-09 DIAGNOSIS — M2170 Unequal limb length (acquired), unspecified site: Secondary | ICD-10-CM | POA: Diagnosis not present

## 2015-10-09 DIAGNOSIS — E785 Hyperlipidemia, unspecified: Secondary | ICD-10-CM | POA: Diagnosis not present

## 2015-10-09 HISTORY — PX: TOTAL HIP ARTHROPLASTY: SHX124

## 2015-10-09 LAB — PROTIME-INR
INR: 0.96
Prothrombin Time: 12.8 seconds (ref 11.4–15.2)

## 2015-10-09 SURGERY — ARTHROPLASTY, HIP, TOTAL,POSTERIOR APPROACH
Anesthesia: General | Site: Hip | Laterality: Right

## 2015-10-09 MED ORDER — FLEET ENEMA 7-19 GM/118ML RE ENEM: 1.0000 | ENEMA | Freq: Once | RECTAL | Status: DC | PRN

## 2015-10-09 MED ORDER — HYDROMORPHONE HCL 2 MG PO TABS: 2.0000 mg | ORAL_TABLET | ORAL | Status: DC | PRN

## 2015-10-09 MED ORDER — SODIUM CHLORIDE 0.9 % IJ SOLN
INTRAMUSCULAR | Status: DC | PRN
  Administered 2015-10-09: 20 mL

## 2015-10-09 MED ORDER — MEPERIDINE HCL 50 MG/ML IJ SOLN: 6.2500 mg | INTRAMUSCULAR | Status: DC | PRN

## 2015-10-09 MED ORDER — HEMOSTATIC AGENTS (NO CHARGE) OPTIME
TOPICAL | Status: DC | PRN
  Administered 2015-10-09: 1

## 2015-10-09 MED ORDER — FENTANYL CITRATE (PF) 100 MCG/2ML IJ SOLN
25.0000 ug | INTRAMUSCULAR | Status: DC | PRN
  Administered 2015-10-09 (×3): 50 ug via INTRAVENOUS

## 2015-10-09 MED ORDER — BRIMONIDINE TARTRATE 0.2 % OP SOLN: 1.0000 [drp] | Freq: Two times a day (BID) | OPHTHALMIC | Status: DC

## 2015-10-09 MED ORDER — RIVAROXABAN 10 MG PO TABS: 10.0000 mg | ORAL_TABLET | Freq: Every day | ORAL | Status: DC

## 2015-10-09 MED ORDER — CHLORHEXIDINE GLUCONATE 4 % EX LIQD: 60.0000 mL | Freq: Once | CUTANEOUS | Status: DC

## 2015-10-09 MED ORDER — DEXAMETHASONE SODIUM PHOSPHATE 10 MG/ML IJ SOLN
INTRAMUSCULAR | Status: AC
  Filled 2015-10-09: qty 1

## 2015-10-09 MED ORDER — PROMETHAZINE HCL 25 MG/ML IJ SOLN
6.2500 mg | INTRAMUSCULAR | Status: DC | PRN
  Administered 2015-10-09: 6.25 mg via INTRAVENOUS

## 2015-10-09 MED ORDER — PROMETHAZINE HCL 25 MG/ML IJ SOLN
INTRAMUSCULAR | Status: AC
  Filled 2015-10-09: qty 1

## 2015-10-09 MED ORDER — TRANEXAMIC ACID 1000 MG/10ML IV SOLN
INTRAVENOUS | Status: DC | PRN
  Administered 2015-10-09: 2000 mg via TOPICAL

## 2015-10-09 MED ORDER — LATANOPROST 0.005 % OP SOLN: 1.0000 [drp] | Freq: Every day | OPHTHALMIC | Status: DC

## 2015-10-09 MED ORDER — FENTANYL CITRATE (PF) 100 MCG/2ML IJ SOLN: 25.0000 ug | INTRAMUSCULAR | Status: DC | PRN

## 2015-10-09 MED ORDER — LACTATED RINGERS IV SOLN
INTRAVENOUS | Status: DC
  Administered 2015-10-09 – 2015-10-10 (×2): via INTRAVENOUS

## 2015-10-09 MED ORDER — PHENYLEPHRINE HCL 10 MG/ML IJ SOLN
INTRAMUSCULAR | Status: AC
  Filled 2015-10-09: qty 1

## 2015-10-09 MED ORDER — SUFENTANIL CITRATE 50 MCG/ML IV SOLN
INTRAVENOUS | Status: DC | PRN
  Administered 2015-10-09 (×2): 5 ug via INTRAVENOUS
  Administered 2015-10-09: 15 ug via INTRAVENOUS

## 2015-10-09 MED ORDER — LIDOCAINE 2% (20 MG/ML) 5 ML SYRINGE
INTRAMUSCULAR | Status: DC | PRN
  Administered 2015-10-09 (×2): 100 mg via INTRAVENOUS

## 2015-10-09 MED ORDER — HYDROMORPHONE HCL 1 MG/ML IJ SOLN
1.0000 mg | INTRAMUSCULAR | Status: DC | PRN
  Administered 2015-10-09 – 2015-10-10 (×2): 1 mg via INTRAVENOUS
  Filled 2015-10-09 (×2): qty 1

## 2015-10-09 MED ORDER — SODIUM CHLORIDE 0.9 % IJ SOLN
INTRAMUSCULAR | Status: AC
  Filled 2015-10-09: qty 50

## 2015-10-09 MED ORDER — PROPOFOL 10 MG/ML IV BOLUS
INTRAVENOUS | Status: DC | PRN
  Administered 2015-10-09: 120 mg via INTRAVENOUS

## 2015-10-09 MED ORDER — METHOCARBAMOL 1000 MG/10ML IJ SOLN
500.0000 mg | Freq: Four times a day (QID) | INTRAVENOUS | Status: DC | PRN
  Administered 2015-10-09 (×2): 500 mg via INTRAVENOUS
  Filled 2015-10-09: qty 5
  Filled 2015-10-09 (×2): qty 550
  Filled 2015-10-09: qty 5

## 2015-10-09 MED ORDER — BISACODYL 5 MG PO TBEC: 5.0000 mg | DELAYED_RELEASE_TABLET | Freq: Every day | ORAL | Status: DC | PRN

## 2015-10-09 MED ORDER — PHENOL 1.4 % MT LIQD: 1.0000 | OROMUCOSAL | Status: DC | PRN

## 2015-10-09 MED ORDER — SUGAMMADEX SODIUM 200 MG/2ML IV SOLN
INTRAVENOUS | Status: AC
  Filled 2015-10-09: qty 2

## 2015-10-09 MED ORDER — LIDOCAINE 2% (20 MG/ML) 5 ML SYRINGE
INTRAMUSCULAR | Status: AC
  Filled 2015-10-09: qty 5

## 2015-10-09 MED ORDER — PROPOFOL 10 MG/ML IV BOLUS
INTRAVENOUS | Status: AC
  Filled 2015-10-09: qty 20

## 2015-10-09 MED ORDER — ONDANSETRON HCL 4 MG/2ML IJ SOLN
INTRAMUSCULAR | Status: AC
  Filled 2015-10-09: qty 2

## 2015-10-09 MED ORDER — ROCURONIUM BROMIDE 10 MG/ML (PF) SYRINGE
PREFILLED_SYRINGE | INTRAVENOUS | Status: AC
  Filled 2015-10-09: qty 10

## 2015-10-09 MED ORDER — CELECOXIB 200 MG PO CAPS
200.0000 mg | ORAL_CAPSULE | Freq: Two times a day (BID) | ORAL | Status: DC
  Administered 2015-10-09 – 2015-10-12 (×7): 200 mg via ORAL
  Filled 2015-10-09 (×7): qty 1

## 2015-10-09 MED ORDER — ONDANSETRON HCL 4 MG PO TABS: 4.0000 mg | ORAL_TABLET | Freq: Four times a day (QID) | ORAL | Status: DC | PRN

## 2015-10-09 MED ORDER — SUGAMMADEX SODIUM 200 MG/2ML IV SOLN
INTRAVENOUS | Status: DC | PRN
  Administered 2015-10-09: 160 mg via INTRAVENOUS

## 2015-10-09 MED ORDER — METOCLOPRAMIDE HCL 5 MG PO TABS
5.0000 mg | ORAL_TABLET | Freq: Three times a day (TID) | ORAL | Status: DC | PRN
Start: 2015-10-09 — End: 2015-10-12

## 2015-10-09 MED ORDER — SUFENTANIL CITRATE 50 MCG/ML IV SOLN
INTRAVENOUS | Status: AC
  Filled 2015-10-09: qty 1

## 2015-10-09 MED ORDER — DEXAMETHASONE SODIUM PHOSPHATE 10 MG/ML IJ SOLN
INTRAMUSCULAR | Status: DC | PRN
  Administered 2015-10-09: 10 mg via INTRAVENOUS

## 2015-10-09 MED ORDER — LACTATED RINGERS IV SOLN
INTRAVENOUS | Status: DC
  Administered 2015-10-09 (×2): via INTRAVENOUS

## 2015-10-09 MED ORDER — BUPIVACAINE LIPOSOME 1.3 % IJ SUSP
20.0000 mL | Freq: Once | INTRAMUSCULAR | Status: DC
Start: 2015-10-09 — End: 2015-10-09
  Filled 2015-10-09: qty 20

## 2015-10-09 MED ORDER — ACETAMINOPHEN 325 MG PO TABS
650.0000 mg | ORAL_TABLET | Freq: Four times a day (QID) | ORAL | Status: DC | PRN
  Administered 2015-10-10 – 2015-10-11 (×3): 650 mg via ORAL
  Filled 2015-10-09 (×4): qty 2

## 2015-10-09 MED ORDER — BUPIVACAINE LIPOSOME 1.3 % IJ SUSP
INTRAMUSCULAR | Status: DC | PRN
  Administered 2015-10-09: 20 mL

## 2015-10-09 MED ORDER — PROMETHAZINE HCL 25 MG/ML IJ SOLN: 6.2500 mg | INTRAMUSCULAR | Status: DC | PRN

## 2015-10-09 MED ORDER — MENTHOL 3 MG MT LOZG: 1.0000 | LOZENGE | OROMUCOSAL | Status: DC | PRN

## 2015-10-09 MED ORDER — METHOCARBAMOL 500 MG PO TABS
500.0000 mg | ORAL_TABLET | Freq: Four times a day (QID) | ORAL | Status: DC | PRN
Start: 2015-10-09 — End: 2015-10-12
  Administered 2015-10-10 – 2015-10-11 (×4): 500 mg via ORAL
  Filled 2015-10-09 (×6): qty 1

## 2015-10-09 MED ORDER — POLYETHYLENE GLYCOL 3350 17 G PO PACK: 17.0000 g | PACK | Freq: Every day | ORAL | Status: DC | PRN

## 2015-10-09 MED ORDER — FENTANYL CITRATE (PF) 100 MCG/2ML IJ SOLN
INTRAMUSCULAR | Status: AC
  Filled 2015-10-09: qty 2

## 2015-10-09 MED ORDER — METOCLOPRAMIDE HCL 5 MG/ML IJ SOLN: 5.0000 mg | Freq: Three times a day (TID) | INTRAMUSCULAR | Status: DC | PRN

## 2015-10-09 MED ORDER — LISINOPRIL 5 MG PO TABS: 5.0000 mg | ORAL_TABLET | Freq: Every day | ORAL | Status: DC

## 2015-10-09 MED ORDER — CEFAZOLIN IN D5W 1 GM/50ML IV SOLN
1.0000 g | Freq: Four times a day (QID) | INTRAVENOUS | Status: AC
  Administered 2015-10-09 – 2015-10-10 (×2): 1 g via INTRAVENOUS
  Filled 2015-10-09 (×2): qty 50

## 2015-10-09 MED ORDER — SODIUM CHLORIDE 0.9 % IR SOLN
Status: AC
  Filled 2015-10-09: qty 500000

## 2015-10-09 MED ORDER — TIMOLOL HEMIHYDRATE 0.5 % OP SOLN: 1.0000 [drp] | Freq: Every morning | OPHTHALMIC | Status: DC

## 2015-10-09 MED ORDER — THROMBIN 5000 UNITS EX SOLR
CUTANEOUS | Status: AC
  Filled 2015-10-09: qty 5000

## 2015-10-09 MED ORDER — SODIUM CHLORIDE 0.9 % IJ SOLN
INTRAMUSCULAR | Status: AC
  Filled 2015-10-09: qty 10

## 2015-10-09 MED ORDER — ONDANSETRON HCL 4 MG/2ML IJ SOLN
INTRAMUSCULAR | Status: DC | PRN
  Administered 2015-10-09: 4 mg via INTRAVENOUS

## 2015-10-09 MED ORDER — ALUM & MAG HYDROXIDE-SIMETH 200-200-20 MG/5ML PO SUSP: 30.0000 mL | ORAL | Status: DC | PRN

## 2015-10-09 MED ORDER — LACTATED RINGERS IV SOLN: INTRAVENOUS | Status: DC

## 2015-10-09 MED ORDER — ROCURONIUM BROMIDE 10 MG/ML (PF) SYRINGE
PREFILLED_SYRINGE | INTRAVENOUS | Status: DC | PRN
  Administered 2015-10-09: 10 mg via INTRAVENOUS
  Administered 2015-10-09: 50 mg via INTRAVENOUS

## 2015-10-09 MED ORDER — ACETAMINOPHEN 650 MG RE SUPP: 650.0000 mg | Freq: Four times a day (QID) | RECTAL | Status: DC | PRN

## 2015-10-09 MED ORDER — SODIUM CHLORIDE 0.9 % IV SOLN
2000.0000 mg | Freq: Once | INTRAVENOUS | Status: DC
  Filled 2015-10-09: qty 20

## 2015-10-09 MED ORDER — ONDANSETRON HCL 4 MG/2ML IJ SOLN: 4.0000 mg | Freq: Four times a day (QID) | INTRAMUSCULAR | Status: DC | PRN

## 2015-10-09 MED ORDER — PHENYLEPHRINE 40 MCG/ML (10ML) SYRINGE FOR IV PUSH (FOR BLOOD PRESSURE SUPPORT)
PREFILLED_SYRINGE | INTRAVENOUS | Status: AC
  Filled 2015-10-09: qty 10

## 2015-10-09 MED ORDER — SODIUM CHLORIDE 0.9 % IV SOLN
INTRAVENOUS | Status: DC | PRN
  Administered 2015-10-09: 50 ug/min via INTRAVENOUS

## 2015-10-09 MED ORDER — CEFAZOLIN SODIUM-DEXTROSE 2-4 GM/100ML-% IV SOLN
2.0000 g | INTRAVENOUS | Status: AC
Start: 2015-10-09 — End: 2015-10-09
  Administered 2015-10-09: 2 g via INTRAVENOUS

## 2015-10-09 MED ORDER — PHENYLEPHRINE HCL 10 MG/ML IJ SOLN
INTRAMUSCULAR | Status: DC | PRN
  Administered 2015-10-09 (×4): 80 ug via INTRAVENOUS

## 2015-10-09 MED ORDER — CEFAZOLIN SODIUM-DEXTROSE 2-4 GM/100ML-% IV SOLN
INTRAVENOUS | Status: AC
Start: 2015-10-09 — End: 2015-10-09
  Filled 2015-10-09: qty 100

## 2015-10-09 MED ORDER — FERROUS SULFATE 325 (65 FE) MG PO TABS
325.0000 mg | ORAL_TABLET | Freq: Three times a day (TID) | ORAL | Status: DC
  Administered 2015-10-09 – 2015-10-12 (×6): 325 mg via ORAL
  Filled 2015-10-09 (×5): qty 1

## 2015-10-09 SURGICAL SUPPLY — 60 items
BAG DECANTER FOR FLEXI CONT (MISCELLANEOUS) ×2 IMPLANT
BAG SPEC THK2 15X12 ZIP CLS (MISCELLANEOUS)
BAG ZIPLOCK 12X15 (MISCELLANEOUS) IMPLANT
BLADE SAW SAG 73X25 THK (BLADE) ×2
BLADE SAW SGTL 73X25 THK (BLADE) ×1 IMPLANT
CAPT HIP TOTAL 2 ×2 IMPLANT
DECANTER SPIKE VIAL GLASS SM (MISCELLANEOUS) ×2 IMPLANT
DRAPE INCISE IOBAN 85X60 (DRAPES) ×3 IMPLANT
DRAPE ORTHO SPLIT 77X108 STRL (DRAPES) ×6
DRAPE POUCH INSTRU U-SHP 10X18 (DRAPES) ×3 IMPLANT
DRAPE SURG 17X11 SM STRL (DRAPES) ×3 IMPLANT
DRAPE SURG ORHT 6 SPLT 77X108 (DRAPES) ×2 IMPLANT
DRAPE U-SHAPE 47X51 STRL (DRAPES) ×3 IMPLANT
DRSG ADAPTIC 3X8 NADH LF (GAUZE/BANDAGES/DRESSINGS) IMPLANT
DRSG AQUACEL AG ADV 3.5X10 (GAUZE/BANDAGES/DRESSINGS) ×2 IMPLANT
DRSG AQUACEL AG ADV 3.5X14 (GAUZE/BANDAGES/DRESSINGS) IMPLANT
DRSG TEGADERM 4X4.75 (GAUZE/BANDAGES/DRESSINGS) IMPLANT
DURAPREP 26ML APPLICATOR (WOUND CARE) ×3 IMPLANT
ELECT BLADE TIP CTD 4 INCH (ELECTRODE) ×3 IMPLANT
ELECT REM PT RETURN 9FT ADLT (ELECTROSURGICAL) ×3
ELECTRODE REM PT RTRN 9FT ADLT (ELECTROSURGICAL) ×1 IMPLANT
EVACUATOR 1/8 PVC DRAIN (DRAIN) IMPLANT
FACESHIELD WRAPAROUND (MASK) ×15 IMPLANT
FACESHIELD WRAPAROUND OR TEAM (MASK) ×4 IMPLANT
GAUZE SPONGE 2X2 8PLY STRL LF (GAUZE/BANDAGES/DRESSINGS) IMPLANT
GAUZE SPONGE 4X4 12PLY STRL (GAUZE/BANDAGES/DRESSINGS) IMPLANT
GLOVE BIOGEL PI IND STRL 6.5 (GLOVE) ×1 IMPLANT
GLOVE BIOGEL PI IND STRL 8 (GLOVE) ×1 IMPLANT
GLOVE BIOGEL PI INDICATOR 6.5 (GLOVE) ×4
GLOVE BIOGEL PI INDICATOR 8 (GLOVE) ×2
GLOVE ECLIPSE 8.0 STRL XLNG CF (GLOVE) ×6 IMPLANT
GLOVE SURG SS PI 6.5 STRL IVOR (GLOVE) ×2 IMPLANT
GOWN STRL REUS W/TWL LRG LVL3 (GOWN DISPOSABLE) ×3 IMPLANT
GOWN STRL REUS W/TWL XL LVL3 (GOWN DISPOSABLE) ×3 IMPLANT
IMMOBILIZER KNEE 20 (SOFTGOODS) ×3
IMMOBILIZER KNEE 20 THIGH 36 (SOFTGOODS) ×1 IMPLANT
LIQUID BAND (GAUZE/BANDAGES/DRESSINGS) ×2 IMPLANT
MANIFOLD NEPTUNE II (INSTRUMENTS) ×3 IMPLANT
MARKER SKIN DUAL TIP RULER LAB (MISCELLANEOUS) ×3 IMPLANT
NDL SAFETY ECLIPSE 18X1.5 (NEEDLE) ×1 IMPLANT
NEEDLE HYPO 18GX1.5 SHARP (NEEDLE) ×3
POSITIONER SURGICAL ARM (MISCELLANEOUS) ×3 IMPLANT
SPONGE GAUZE 2X2 STER 10/PKG (GAUZE/BANDAGES/DRESSINGS)
SPONGE LAP 18X18 X RAY DECT (DISPOSABLE) IMPLANT
SPONGE LAP 4X18 X RAY DECT (DISPOSABLE) ×3 IMPLANT
SPONGE SURGIFOAM ABS GEL 100 (HEMOSTASIS) ×3 IMPLANT
STAPLER VISISTAT 35W (STAPLE) IMPLANT
SUCTION FRAZIER HANDLE 10FR (MISCELLANEOUS) ×2
SUCTION TUBE FRAZIER 10FR DISP (MISCELLANEOUS) ×1 IMPLANT
SUT MNCRL AB 4-0 PS2 18 (SUTURE) ×2 IMPLANT
SUT VIC AB 1 CT1 27 (SUTURE) ×3
SUT VIC AB 1 CT1 27XBRD ANTBC (SUTURE) ×1 IMPLANT
SUT VIC AB 2-0 CT1 27 (SUTURE) ×9
SUT VIC AB 2-0 CT1 TAPERPNT 27 (SUTURE) ×3 IMPLANT
SUT VLOC 180 0 24IN GS25 (SUTURE) ×3 IMPLANT
SYR 20CC LL (SYRINGE) ×1 IMPLANT
TOWEL OR 17X26 10 PK STRL BLUE (TOWEL DISPOSABLE) ×6 IMPLANT
TRAY FOLEY W/METER SILVER 16FR (SET/KITS/TRAYS/PACK) ×3 IMPLANT
WATER STERILE IRR 1500ML POUR (IV SOLUTION) ×5 IMPLANT
YANKAUER SUCT BULB TIP 10FT TU (MISCELLANEOUS) ×3 IMPLANT

## 2015-10-09 NOTE — Interval H&P Note (Signed)
History and Physical Interval Note:  10/09/2015 7:16 AM  [name] [name]  has presented today for surgery, with the diagnosis of right hip osteoarthritis  The various methods of treatment have been discussed with the patient and family. After consideration of risks, benefits and other options for treatment, the patient has consented to  Procedure(s): RIGHT TOTAL HIP ARTHROPLASTY (Right) as a surgical intervention .  The patient's history has been reviewed, patient examined, no change in status, stable for surgery.  I have reviewed the patient's chart and labs.  Questions were answered to the patient's satisfaction.     [name]

## 2015-10-09 NOTE — Brief Op Note (Signed)
10/09/2015  8:53 AM  PATIENT:  [name] [name]  75 y.o. male  PRE-OPERATIVE DIAGNOSIS:  right hip osteoarthritis  POST-OPERATIVE DIAGNOSIS:  right hip osteoarthritis  PROCEDURE:  Procedure(s): RIGHT TOTAL HIP ARTHROPLASTY (Right)  SURGEON:  Surgeon(s) and Role:    * Latanya Maudlin, MD - Primary  PHYSICIAN ASSISTANT: Ardeen Jourdain PA  ASSISTANTS: Ardeen Jourdain PA  ANESTHESIA:   general  EBL:  Total I/O In: 1000 [I.V.:1000] Out: 1175 [Urine:925; Blood:250]  BLOOD ADMINISTERED:none  DRAINS: none   LOCAL MEDICATIONS USED:  OTHER Exparel 20cc mixed with 20cc of Normal Saline  SPECIMEN:  No Specimen  DISPOSITION OF SPECIMEN:  N/A  COUNTS:  YES  TOURNIQUET:  * No tourniquets in log *  DICTATION: .Other Dictation: Dictation Number [id number]  PLAN OF CARE: Admit to inpatient   PATIENT DISPOSITION:  Stable in OR   Delay start of Pharmacological VTE agent (>24hrs) due to surgical blood loss or risk of bleeding: yes

## 2015-10-09 NOTE — Anesthesia Preprocedure Evaluation (Signed)
Anesthesia Evaluation  Patient identified by MRN, date of birth, ID band Patient awake    Reviewed: Allergy & Precautions, NPO status , Patient's Chart, lab work & pertinent test results  Airway        Dental   Pulmonary neg pulmonary ROS,           Cardiovascular hypertension, Pt. on medications + CAD, + CABG and +CHF  + Valvular Problems/Murmurs AS   CABGx2, AVR    Neuro/Psych negative neurological ROS  negative psych ROS   GI/Hepatic negative GI ROS, Neg liver ROS,   Endo/Other  negative endocrine ROS  Renal/GU negative Renal ROS  negative genitourinary   Musculoskeletal  (+) Arthritis , Osteoarthritis,    Abdominal   Peds negative pediatric ROS (+)  Hematology negative hematology ROS (+)   Anesthesia Other Findings   Reproductive/Obstetrics negative OB ROS                             Lab Results  Component Value Date   WBC 8.6 10/03/2015   HGB 13.8 10/03/2015   HCT 41.4 10/03/2015   MCV 88.1 10/03/2015   PLT 164 10/03/2015   Lab Results  Component Value Date   CREATININE 0.82 10/03/2015   BUN 22 (H) 10/03/2015   NA 137 10/03/2015   K 4.7 10/03/2015   CL 106 10/03/2015   CO2 26 10/03/2015   Lab Results  Component Value Date   INR 0.96 10/09/2015   INR 0.97 10/03/2015   INR 1.02 12/26/2013   09/2015 EKG: normal sinus rhythm, occasional PVC noted, unifocal.    Anesthesia Physical Anesthesia Plan  ASA: III  Anesthesia Plan: General   Post-op Pain Management:    Induction: Intravenous  Airway Management Planned: LMA  Additional Equipment:   Intra-op Plan:   Post-operative Plan: Extubation in OR  Informed Consent: I have reviewed the patients History and Physical, chart, labs and discussed the procedure including the risks, benefits and alternatives for the proposed anesthesia with the patient or authorized representative who has indicated his/her  understanding and acceptance.   Dental advisory given  Plan Discussed with: CRNA  Anesthesia Plan Comments:         Anesthesia Quick Evaluation

## 2015-10-09 NOTE — Anesthesia Procedure Notes (Signed)
Procedure Name: Intubation Date/Time: 10/09/2015 7:30 AM Performed by: Danley Danker L Patient Re-evaluated:Patient Re-evaluated prior to inductionOxygen Delivery Method: Circle system utilized Preoxygenation: Pre-oxygenation with 100% oxygen Intubation Type: IV induction Ventilation: Mask ventilation without difficulty and Oral airway inserted - appropriate to patient size Laryngoscope Size: Miller and 3 Grade View: Grade I Tube type: Oral Tube size: 8.0 mm Airway Equipment and Method: Stylet Placement Confirmation: ETT inserted through vocal cords under direct vision,  positive ETCO2 and breath sounds checked- equal and bilateral Secured at: 22 cm Tube secured with: Tape Dental Injury: Teeth and Oropharynx as per pre-operative assessment

## 2015-10-09 NOTE — Anesthesia Postprocedure Evaluation (Signed)
Anesthesia Post Note  Patient: [name] [name]  Procedure(s) Performed: Procedure(s) (LRB): RIGHT TOTAL HIP ARTHROPLASTY (Right)  Patient location during evaluation: PACU Anesthesia Type: General Level of consciousness: awake and alert Pain management: pain level controlled Vital Signs Assessment: post-procedure vital signs reviewed and stable Respiratory status: spontaneous breathing, nonlabored ventilation, respiratory function stable and patient connected to nasal cannula oxygen Cardiovascular status: blood pressure returned to baseline and stable Postop Assessment: no signs of nausea or vomiting Anesthetic complications: no    Last Vitals:  Vitals:   10/09/15 1100 10/09/15 1115  BP: (!) 125/91 114/69  Pulse: (!) 55 67  Resp: 10 (!) 8  Temp:      Last Pain:  Vitals:   10/09/15 1100  TempSrc:   PainSc: [place] Kaylynne Andres

## 2015-10-09 NOTE — Anesthesia Preprocedure Evaluation (Addendum)
Anesthesia Evaluation  Patient identified by MRN, date of birth, ID band Patient awake    Reviewed: Allergy & Precautions, NPO status , Patient's Chart, lab work & pertinent test results  History of Anesthesia Complications (+) POST - OP SPINAL HEADACHE  Airway Mallampati: II  TM Distance: >3 FB     Dental  (+) Dental Advisory Given, Teeth Intact   Pulmonary neg pulmonary ROS,    breath sounds clear to auscultation       Cardiovascular hypertension, + CAD and +CHF  + Valvular Problems/Murmurs AS  Rhythm:Regular Rate:Normal  CABGx2, AVR   Neuro/Psych negative neurological ROS  negative psych ROS   GI/Hepatic negative GI ROS, Neg liver ROS,   Endo/Other  negative endocrine ROS  Renal/GU negative Renal ROS  negative genitourinary   Musculoskeletal  (+) Arthritis , Osteoarthritis,    Abdominal   Peds negative pediatric ROS (+)  Hematology negative hematology ROS (+)   Anesthesia Other Findings   Reproductive/Obstetrics negative OB ROS                           03/2014 Echo - Left ventricle: The cavity size was normal. There was moderate concentric hypertrophy. Systolic function was normal. The estimated ejection fraction was in the range of 60% to 65%. Wall motion was normal; there were no regional wall motion abnormalities. The study is not technically sufficient to allow evaluation of LV diastolic function. - Aortic valve: Bioprosthetic AVR with no obstruction. No signficant AI. Mean gradient (S): 10 mm Hg. Peak gradient (S): 20 mm Hg.  Anesthesia Physical Anesthesia Plan  ASA: III  Anesthesia Plan: General   Post-op Pain Management:    Induction: Intravenous  Airway Management Planned: Oral ETT  Additional Equipment:   Intra-op Plan:   Post-operative Plan: Extubation in OR  Informed Consent:   Dental advisory given  Plan Discussed with: CRNA,  Anesthesiologist and Surgeon  Anesthesia Plan Comments:         Anesthesia Quick Evaluation

## 2015-10-09 NOTE — Anesthesia Procedure Notes (Addendum)
Date/Time: 10/09/2015 7:31 AM Performed by: Danley Danker L Preoxygenation: Pre-oxygenation with 100% oxygen

## 2015-10-09 NOTE — Transfer of Care (Signed)
Immediate Anesthesia Transfer of Care Note  Patient: [name] [name]  Procedure(s) Performed: Procedure(s): RIGHT TOTAL HIP ARTHROPLASTY (Right)  Patient Location: PACU  Anesthesia Type:General  Level of Consciousness: awake  Airway & Oxygen Therapy: Patient Spontanous Breathing and Patient connected to face mask oxygen  Post-op Assessment: Report given to RN and Post -op Vital signs reviewed and stable  Post vital signs: Reviewed and stable  Last Vitals:  Vitals:   10/09/15 0544  BP: 130/87  Pulse: 67  Resp: 18  Temp: 37 C    Last Pain:  Vitals:   10/09/15 0601  TempSrc:   PainSc: 3       Patients Stated Pain Goal: 4 ([id number])  Complications: No apparent anesthesia complications

## 2015-10-10 ENCOUNTER — Encounter (HOSPITAL_COMMUNITY): Payer: Self-pay | Admitting: Orthopedic Surgery

## 2015-10-10 LAB — BASIC METABOLIC PANEL
ANION GAP: 4 — AB (ref 5–15)
BUN: 22 mg/dL — ABNORMAL HIGH (ref 6–20)
CHLORIDE: 107 mmol/L (ref 101–111)
CO2: 25 mmol/L (ref 22–32)
Calcium: 8.1 mg/dL — ABNORMAL LOW (ref 8.9–10.3)
Creatinine, Ser: 0.83 mg/dL (ref 0.61–1.24)
GFR calc non Af Amer: >60 mL/min (ref >60)
Glucose, Bld: 118 mg/dL — ABNORMAL HIGH (ref 65–99)
Potassium: 4.2 mmol/L (ref 3.5–5.1)
Sodium: 136 mmol/L (ref 135–145)

## 2015-10-10 LAB — CBC
HEMATOCRIT: 30.7 % — AB (ref 39.0–52.0)
HEMOGLOBIN: 10.8 g/dL — AB (ref 13.0–17.0)
MCH: 29.8 pg (ref 26.0–34.0)
MCHC: 35.2 g/dL (ref 30.0–36.0)
MCV: 84.6 fL (ref 78.0–100.0)
Platelets: 131 10*3/uL — ABNORMAL LOW (ref 150–400)
RBC: 3.63 MIL/uL — ABNORMAL LOW (ref 4.22–5.81)
RDW: 13.1 % (ref 11.5–15.5)
WBC: 12.1 10*3/uL — AB (ref 4.0–10.5)

## 2015-10-10 MED ORDER — HYDROMORPHONE HCL 2 MG PO TABS: 2.0000 mg | ORAL_TABLET | ORAL | Status: DC | PRN

## 2015-10-10 MED ORDER — APIXABAN 5 MG PO TABS: 5.0000 mg | ORAL_TABLET | Freq: Two times a day (BID) | ORAL | Status: DC

## 2015-10-10 MED ORDER — APIXABAN 2.5 MG PO TABS
2.5000 mg | ORAL_TABLET | Freq: Two times a day (BID) | ORAL | Status: DC
  Administered 2015-10-10 – 2015-10-12 (×5): 2.5 mg via ORAL
  Filled 2015-10-10 (×5): qty 1

## 2015-10-10 NOTE — Progress Notes (Signed)
Physical Therapy Treatment Patient Details Name: [name] [name] MRN: [id number] DOB: 05/28/1940 Today's Date: 10/10/2015    History of Present Illness s/p R DA THA    PT Comments    Pt progressing with mobility but requiring frequent reinforcement of posterior THP.  Follow Up Recommendations  Home health PT;Supervision/Assistance - 24 hour     Equipment Recommendations  Rolling walker with 5" wheels    Recommendations for Other Services OT consult     Precautions / Restrictions Precautions Precautions: Posterior Hip;Fall Precaution Booklet Issued: Yes (comment) Precaution Comments: Reviewed x 3 with pt and dtr Restrictions Weight Bearing Restrictions: Yes RLE Weight Bearing: Partial weight bearing RLE Partial Weight Bearing Percentage or Pounds: 50%    Mobility  Bed Mobility Overal bed mobility: Needs Assistance Bed Mobility: Supine to Sit     Supine to sit: Min assist     General bed mobility comments: cues for sequence, adherence to THP and use of L LE to self assist  Transfers Overall transfer level: Needs assistance Equipment used: Rolling walker (2 wheeled) Transfers: Sit to/from Stand Sit to Stand: Min assist         General transfer comment: assist to rise and steady.  physical cue for LE placement and vcs for hand placement.  Assist to rise and steady  Ambulation/Gait Ambulation/Gait assistance: Min assist;Min guard Ambulation Distance (Feet): 58 Feet Assistive device: Rolling walker (2 wheeled) Gait Pattern/deviations: Step-to pattern;Shuffle;Trunk flexed Gait velocity: decr Gait velocity interpretation: Below normal speed for age/gender General Gait Details: cues for sequence, posture, position from RW and PWB   Stairs            Wheelchair Mobility    Modified Rankin (Stroke Patients Only)       Balance                                    Cognition Arousal/Alertness: Awake/alert Behavior During Therapy: WFL for  tasks assessed/performed Overall Cognitive Status: History of cognitive impairments - at baseline       Memory: Decreased short-term memory;Decreased recall of precautions              Exercises Total Joint Exercises Ankle Circles/Pumps: AROM;Both;15 reps;Supine Quad Sets: AROM;Both;10 reps;Supine Heel Slides: AAROM;Right;15 reps;Supine Hip ABduction/ADduction: AAROM;Right;15 reps;Supine    General Comments        Pertinent Vitals/Pain Pain Assessment: Faces Faces Pain Scale: Hurts little more Pain Location: R hip Pain Descriptors / Indicators: Aching;Sore Pain Intervention(s): Limited activity within patient's tolerance;Monitored during session;Premedicated before session;Ice applied    Home Living                      Prior Function            PT Goals (current goals can now be found in the care plan section) Acute Rehab PT Goals Patient Stated Goal: return to being independent and caregiver for wife PT Goal Formulation: With patient Time For Goal Achievement: 10/14/15 Potential to Achieve Goals: Good Progress towards PT goals: Progressing toward goals    Frequency    7X/week      PT Plan Current plan remains appropriate    Co-evaluation             End of Session Equipment Utilized During Treatment: Gait belt Activity Tolerance: Patient tolerated treatment well Patient left: in chair;with call bell/phone within reach;with chair alarm set;with family/visitor present  Time: [id number] PT Time Calculation (min) (ACUTE ONLY): 32 min  Charges:  $Gait Training: 8-22 mins $Therapeutic Exercise: 8-22 mins                    G Codes:      Tashunda Vandezande 09-Nov-2015, 5:14 PM

## 2015-10-10 NOTE — Op Note (Signed)
NAMECORBITT, FOSS NO.:  [id number]  MEDICAL RECORD NO.:  [id number]  LOCATION:  [id number]                         FACILITY:  [facility]  PHYSICIAN:  Kipp Brood. Diezel Mazur, M.D.DATE OF BIRTH:  1940-05-17  DATE OF PROCEDURE:  10/09/2015 DATE OF DISCHARGE:                              OPERATIVE REPORT   SURGEON:  Kipp Brood. Gladstone Lighter, M.D.  OPERATIVE ASSISTANT:  Ardeen Jourdain, PA  PREOPERATIVE DIAGNOSIS:  Severe osteoarthritis, primary type, right hip.  POSTOPERATIVE DIAGNOSES: 1. Severe osteoarthritis, primary type, right hip. 2. Leg length discrepancy.  His right leg length was about 0.5 inch     shorter than his left leg length.  OPERATION:  Right total hip arthroplasty utilizing the DePuy system.  I utilized a size 56 pinnacle cup with 1 screw.  Screw length was 25 mm. Also, I used the hole eliminator for the central hole of the cup.  The insert was a size 56 polyethylene insert.  The stem size was a Tri-Lock stem, it was a size 5, high offset.  The neck length was a +5.  PROCEDURE:  Under general anesthesia, the patient on left side, right side up, the appropriate time-out was carried out.  I also marked the appropriate right hip in the holding area.  The patient had 2 g of IV Ancef.  He also had tranexamic acid IV.  At this time, posterolateral approach to the hip was carried out.  Bleeders were identified and cauterized.  Self-retaining retractors were inserted.  I then went down and partially detached the external rotators.  Following that, I went on and did a capsulectomy and dislocated the head in usual fashion.  The femoral head was dislocated, and I amputated the head with the oscillating saw with the appropriate neck length.  At this time, we utilized the box osteotome and a widening reamer, and then the canal finder, we were nicely down the canal.  I then rasped the canal up to a size 5 Tri-Lock stem.  I thoroughly irrigated out the area, packed it with a  sponge, and later which was removed.  This was a large pack.  At this time, we then completed the capsulectomy around the acetabulum.  I then went on and reamed the acetabulum up to a size 55 for 56 mm cup. The 56 mm cup was inserted and fit quite nicely.  It was very snug, but I still used 125 mm screw for fixation.  The hole eliminator was applied, and then we inserted our polyethylene liner.  I then inserted my permanent Tri-Lock stem size 5 in some anteversion at this point.  Following that, we went through trials.  We utilized a +1.5 ball, and it was a little loose, so we decided for more stability and put a +5 ball. Leg lengths were measured as best we could in the OR, and for stability purposes, we needed a +5.  We reduced the hip and had excellent motion of the hip. The leg lengths appeared to be fine as well.  I then irrigated out the wound and inserted some Ultrafoam for anticoagulation purposes.  The wound then was closed in layers in usual fashion,  the skin with running locking Monocryl suture.  Sterile dressings were applied.          ______________________________ Kipp Brood Gladstone Lighter, M.D.     RAG/MEDQ  D:  10/09/2015  T:  10/10/2015  Job:  [id number]

## 2015-10-10 NOTE — Care Management Note (Signed)
Case Management Note  Patient Details  Name: [name] [name] MRN: [id number] Date of Birth: 1940/03/13  Subjective/Objective:                  RIGHT TOTAL HIP ARTHROPLASTY (Right) Action/Plan: Discharge planning Expected Discharge Date:  10/11/15               Expected Discharge Plan:  [place]  In-House Referral:     Discharge planning Services  CM Consult  Post Acute Care Choice:  Home Health Choice offered to:  Adult Children, Patient  DME Arranged:  Walker rolling DME Agency:  [place]:  PT [employer] Agency:  [facility] (formerly [facility])  Status of Service:  Completed, signed off  If discussed at [employer] of Stay Meetings, dates discussed:    Additional Comments: CM met with pt and daughter, [name] [name] [phone] in room to offer choice of home health agency.  Pt chooses [facility] to render HHPT.  Referral given to [facility] rep, Tim.  CM notified [facility] DME rep, Jermaine to please deliver the rolling walker to room prior to discharge.  No other CM needs were communicated. Dellie Catholic, RN 10/10/2015, 11:11 AM

## 2015-10-10 NOTE — Evaluation (Signed)
Physical Therapy Evaluation Patient Details Name: [name] [name] MRN: [id number] DOB: October 18, 1940 Today's Date: 10/10/2015   History of Present Illness  s/p R DA THA  Clinical Impression  Pt s/p R THR presents with decreased R LE strength/ROM, post op pain, PWB on R LE and posterior THP limiting functional mobility.  Pt should progress to dc home with 24/7 assist of family and HHPT follow up.    Follow Up Recommendations Home health PT;Supervision/Assistance - 24 hour    Equipment Recommendations  Rolling walker with 5" wheels    Recommendations for Other Services OT consult     Precautions / Restrictions Precautions Precautions: Posterior Hip;Fall Precaution Booklet Issued: Yes (comment) Precaution Comments: Reviewed x 3 with pt and dtr Restrictions Weight Bearing Restrictions: Yes RLE Weight Bearing: Partial weight bearing RLE Partial Weight Bearing Percentage or Pounds: 50      Mobility  Bed Mobility               General bed mobility comments: oob  Transfers Overall transfer level: Needs assistance Equipment used: Rolling walker (2 wheeled) Transfers: Sit to/from Stand Sit to Stand: Min assist         General transfer comment: assist to rise and steady.  physical cue for LE placement and vcs for hand placement.  Assist to rise and steady  Ambulation/Gait Ambulation/Gait assistance: Min assist Ambulation Distance (Feet): 57 Feet Assistive device: Rolling walker (2 wheeled) Gait Pattern/deviations: Step-to pattern;Decreased step length - right;Decreased step length - left;Shuffle;Trunk flexed Gait velocity: decr Gait velocity interpretation: Below normal speed for age/gender General Gait Details: cues for sequence, posture, position from RW and PWB  Stairs            Wheelchair Mobility    Modified Rankin (Stroke Patients Only)       Balance                                             Pertinent Vitals/Pain Pain  Assessment: Faces Pain Score: 4  Faces Pain Scale: Hurts little more Pain Location: R hip Pain Descriptors / Indicators: Aching;Sore Pain Intervention(s): Limited activity within patient's tolerance;Monitored during session;Premedicated before session;Ice applied    Home Living Family/patient expects to be discharged to:: Private residence Living Arrangements: Spouse/significant other Available Help at Discharge: Family Type of Home: House Home Access: Stairs to enter;Ramped entrance Entrance Stairs-Rails: Right;Left Entrance Stairs-Number of Steps: [place]: Two level;Able to live on main level with bedroom/bathroom Home Equipment: Bedside commode;Toilet riser      Prior Function Level of Independence: Independent               Hand Dominance        Extremity/Trunk Assessment   Upper Extremity Assessment: Overall WFL for tasks assessed           Lower Extremity Assessment: RLE deficits/detail         Communication   Communication: HOH  Cognition Arousal/Alertness: Awake/alert Behavior During Therapy: WFL for tasks assessed/performed Overall Cognitive Status: History of cognitive impairments - at baseline       Memory: Decreased short-term memory;Decreased recall of precautions              General Comments      Exercises Total Joint Exercises Ankle Circles/Pumps: AROM;Both;15 reps;Supine   Assessment/Plan    PT Assessment Patient needs continued PT services  PT  Problem List Decreased strength;Decreased range of motion;Decreased activity tolerance;Decreased mobility;Decreased knowledge of use of DME;Pain;Decreased knowledge of precautions;Decreased cognition          PT Treatment Interventions DME instruction;Gait training;Stair training;Functional mobility training;Therapeutic activities;Therapeutic exercise;Patient/family education    PT Goals (Current goals can be found in the Care Plan section)  Acute Rehab PT Goals Patient  Stated Goal: return to being independent and caregiver for wife PT Goal Formulation: With patient Time For Goal Achievement: 10/14/15 Potential to Achieve Goals: Good    Frequency 7X/week   Barriers to discharge        Co-evaluation               End of Session Equipment Utilized During Treatment: Gait belt Activity Tolerance: Patient tolerated treatment well Patient left: in chair;with call bell/phone within reach;with chair alarm set;with family/visitor present Nurse Communication: Mobility status         Time: [id number] PT Time Calculation (min) (ACUTE ONLY): 46 min   Charges:         PT G Codes:        [name] [name] 2015-11-01, 1:00 PM

## 2015-10-10 NOTE — Progress Notes (Signed)
Subjective: 1 Day Post-Op Procedure(s) (LRB): RIGHT TOTAL HIP ARTHROPLASTY (Right) Patient reports pain as 2 on 0-10 scale.    Objective: Vital signs in last 24 hours: Temp:  [97.5 F (36.4 C)-98.9 F (37.2 C)] 98.8 F (37.1 C) (09/28 0523) Pulse Rate:  [31-104] 76 (09/28 0523) Resp:  [8-17] 16 (09/28 0523) BP: (92-168)/(61-105) 112/72 (09/28 0523) SpO2:  [95 %-100 %] 98 % (09/28 0523)  Intake/Output from previous day: 09/27 0701 - 09/28 0700 In: 2505 [I.V.:2400; IV Piggyback:105] Out: 2700 [Urine:2450; Blood:250] Intake/Output this shift: No intake/output data recorded.   Recent Labs  10/10/15 0450  HGB 10.8*    Recent Labs  10/10/15 0450  WBC 12.1*  RBC 3.63*  HCT 30.7*  PLT 131*    Recent Labs  10/10/15 0450  NA 136  K 4.2  CL 107  CO2 25  BUN 22*  CREATININE 0.83  GLUCOSE 118*  CALCIUM 8.1*    Recent Labs  10/09/15 0550  INR 0.96    Dorsiflexion/Plantar flexion intact  Assessment/Plan: 1 Day Post-Op Procedure(s) (LRB): RIGHT TOTAL HIP ARTHROPLASTY (Right) Up with therapy discontinued Tomorrow.  [name] [name] A 10/10/2015, 7:12 AM

## 2015-10-10 NOTE — Evaluation (Signed)
Occupational Therapy Evaluation Patient Details Name: [name] [name] MRN: [id number] DOB: 07-06-1940 Today's Date: 10/10/2015    History of Present Illness s/p R DA THA   Clinical Impression   This 75 year old man was admitted for the above sx. At baseline, he is very independent and is the caregiver for his wife.  He will benefit from continued OT to increase safety and independence with ADLs following posterior THPs. Goals in acute are for min guard to supervision level.    Follow Up Recommendations  Supervision/Assistance - 24 hour;Home health OT (vs SNF, if family cannot manage)    Equipment Recommendations    none recommended by OT   Recommendations for Other Services       Precautions / Restrictions Precautions Precautions: Posterior Hip;Fall Restrictions Weight Bearing Restrictions: Yes RLE Weight Bearing: Partial weight bearing RLE Partial Weight Bearing Percentage or Pounds: 50      Mobility Bed Mobility               General bed mobility comments: oob  Transfers Overall transfer level: Needs assistance Equipment used: Rolling walker (2 wheeled) Transfers: Sit to/from Stand Sit to Stand: Min assist         General transfer comment: assist to rise and steady.  physical cue for LE placement and vcs for hand placement.  Assist to rise and steady    Balance                                            ADL Overall ADL's : Needs assistance/impaired     Grooming: Set up;Supervision/safety;Sitting   Upper Body Bathing: Supervision/ safety;Set up;Sitting   Lower Body Bathing: Minimal assistance;With adaptive equipment;Sit to/from stand   Upper Body Dressing : Supervision/safety;Set up;Sitting   Lower Body Dressing: Moderate assistance;Sit to/from stand;With adaptive equipment   Toilet Transfer: Minimal assistance;Ambulation;BSC;RW             General ADL Comments: practiced with AE and reviewed posterior THPs with pt and  daughter:  Pt needs longer models of reacher, shoehorn and sponge.   Assisted pt with donning underwear.  At end of session, pt needed to use restroom.  Multimodal cues and assist with walker as pt pushed walker too far away from body.  Pt was not happy that therapist had to be in the bathroom with him.  Pulled shower curtain and stood behind it looking at shoulder position as he has not mastered THPs at this time.  Pt frustrated about need for supervision.  He is used to being very independent and caregiver for his wife who had a stroke.  Talked to daughter after session.  Family will have to provide this amount of supervision until pt masters the precautions.  He can have towel over his lap when sitting on commode.     Vision     Perception     Praxis      Pertinent Vitals/Pain Pain Assessment: Faces Pain Score: 4  Faces Pain Scale: Hurts little more Pain Location: R hip Pain Intervention(s): Limited activity within patient's tolerance;Monitored during session;Premedicated before session;Repositioned (declined further ice)     Hand Dominance     Extremity/Trunk Assessment Upper Extremity Assessment Upper Extremity Assessment: Overall WFL for tasks assessed           Communication Communication Communication: HOH   Cognition Arousal/Alertness: Awake/alert Behavior During  Therapy: WFL for tasks assessed/performed Overall Cognitive Status:  (has some decreased memory per daughter)                     General Comments       Exercises       Shoulder Instructions      Home Living Family/patient expects to be discharged to:: Private residence Living Arrangements: Spouse/significant other Available Help at Discharge: Family               Bathroom Shower/Tub: Walk-in [employer] treasurer Toilet: Standard     Home Equipment: Bedside commode;Toilet riser (safety frame)          Prior Functioning/Environment Level of Independence: Independent                  OT Problem List: Decreased strength;Decreased knowledge of use of DME or AE;Decreased knowledge of precautions;Pain   OT Treatment/Interventions: Self-care/ADL training;DME and/or AE instruction;Therapeutic activities;Patient/family education    OT Goals(Current goals can be found in the care plan section) Acute Rehab OT Goals Patient Stated Goal: return to being independent and caregiver for wife OT Goal Formulation: With patient/family Time For Goal Achievement: 10/17/15 Potential to Achieve Goals: Good ADL Goals Pt Will Transfer to Toilet: with min guard assist;ambulating;bedside commode Pt Will Perform Tub/Shower Transfer: Shower transfer;with min guard assist;ambulating;3 in 1 Additional ADL Goal #1: pt will perform LB adls with supervision, sit to stand and AE with no more than 1 vc per activity for posterior THPs  OT Frequency: Min 2X/week   Barriers to D/C:            Co-evaluation              End of Session Nurse Communication:  (need for alarm)  Activity Tolerance: Patient tolerated treatment well Patient left: in chair;with call bell/phone within reach;with chair alarm set;with family/visitor present   Time: [id number] OT Time Calculation (min): 55 min Charges:  OT General Charges $OT Visit: 1 Procedure OT Evaluation $OT Eval Low Complexity: 1 Procedure OT Treatments $Self Care/Home Management : 38-52 mins G-Codes:    Chae Oommen 11-05-2015, 12:33 PM  Lesle Chris, OTR/L [phone] 2015/11/05

## 2015-10-11 LAB — BASIC METABOLIC PANEL
ANION GAP: 5 (ref 5–15)
BUN: 17 mg/dL (ref 6–20)
CHLORIDE: 108 mmol/L (ref 101–111)
CO2: 25 mmol/L (ref 22–32)
Calcium: 8.3 mg/dL — ABNORMAL LOW (ref 8.9–10.3)
Creatinine, Ser: 0.75 mg/dL (ref 0.61–1.24)
GFR calc Af Amer: >60 mL/min (ref >60)
GLUCOSE: 88 mg/dL (ref 65–99)
POTASSIUM: 3.9 mmol/L (ref 3.5–5.1)
Sodium: 138 mmol/L (ref 135–145)

## 2015-10-11 LAB — CBC
HEMATOCRIT: 33.7 % — AB (ref 39.0–52.0)
HEMOGLOBIN: 11.5 g/dL — AB (ref 13.0–17.0)
MCH: 29.6 pg (ref 26.0–34.0)
MCHC: 34.1 g/dL (ref 30.0–36.0)
MCV: 86.9 fL (ref 78.0–100.0)
Platelets: 128 10*3/uL — ABNORMAL LOW (ref 150–400)
RBC: 3.88 MIL/uL — AB (ref 4.22–5.81)
RDW: 13.5 % (ref 11.5–15.5)
WBC: 8.9 10*3/uL (ref 4.0–10.5)

## 2015-10-11 MED ORDER — TRAMADOL HCL 50 MG PO TABS
50.0000 mg | ORAL_TABLET | Freq: Four times a day (QID) | ORAL | Status: DC | PRN
  Administered 2015-10-11 – 2015-10-12 (×4): 50 mg via ORAL
  Filled 2015-10-11: qty 2
  Filled 2015-10-11 (×3): qty 1

## 2015-10-11 MED ORDER — ACETAMINOPHEN 500 MG PO TABS
500.0000 mg | ORAL_TABLET | Freq: Three times a day (TID) | ORAL | Status: DC | PRN
  Administered 2015-10-11 (×2): 1000 mg via ORAL
  Filled 2015-10-11 (×2): qty 2

## 2015-10-11 MED ORDER — METHOCARBAMOL 500 MG PO TABS: 500.0000 mg | ORAL_TABLET | Freq: Four times a day (QID) | ORAL | 1 refills | Status: DC | PRN

## 2015-10-11 MED ORDER — ACETAMINOPHEN 500 MG PO TABS: 500.0000 mg | ORAL_TABLET | Freq: Three times a day (TID) | ORAL | 0 refills | Status: DC | PRN

## 2015-10-11 MED ORDER — TRAMADOL HCL 50 MG PO TABS: 50.0000 mg | ORAL_TABLET | Freq: Four times a day (QID) | ORAL | 0 refills | Status: DC | PRN

## 2015-10-11 NOTE — Progress Notes (Signed)
Occupational Therapy Treatment Patient Details Name: [name] [name] MRN: [id number] DOB: 1940/08/26 Today's Date: 10/11/2015    History of present illness s/p R DA THA   OT comments  Pt is making progress.  Does not fully understand THPs and risks of breaking them.  He is used to being very independent, and the caregiver for his wife  Follow Up Recommendations  Supervision/Assistance - 24 hour;Home health OT    Equipment Recommendations  None recommended by OT (has Elevated toilet seat and 3:1)    Recommendations for Other Services      Precautions / Restrictions Precautions Precautions: Posterior Hip;Fall Precaution Booklet Issued: Yes (comment) Precaution Comments: Pt unable to recall any posterior THP.  Reviewed all with pt and dtr Restrictions Weight Bearing Restrictions: Yes RLE Weight Bearing: Partial weight bearing RLE Partial Weight Bearing Percentage or Pounds: 50%       Mobility Bed Mobility Overal bed mobility: Needs Assistance           General bed mobility comments: oob  Transfers Overall transfer level: Needs assistance Equipment used: Rolling walker (2 wheeled) Transfers: Sit to/from Stand Sit to Stand: Min guard         General transfer comment: cues for LE managment, use of UEs to self assist and adherence to THP    Balance                                   ADL                       Lower Body Dressing: Minimal assistance;Sit to/from stand;With adaptive equipment                 General ADL Comments: practiced donning underwear over pants (already dressed) using long reacher.  Covered him in a gown to demonstrate how to do this at home as another daughter was present today. Pt is very independent and modest.   Practiced with sock aide, but pt is agreeable to letting family assist with this.  He can state one precaution, but he really doesn't understand the implications.  He states "I can just put my socks and  underwear on"  Reinforced that he cannot safely do this as he had been or he is at risk for further surgery.  Demonstrated shower transfer, but pt did not practice today.  Cues for hip precautions and safety.  Encouraged one hand on walker when pulling up pants and then switching      Vision                     Perception     Praxis      Cognition   Behavior During Therapy: WFL for tasks assessed/performed Overall Cognitive Status: History of cognitive impairments - at baseline       Memory: Decreased short-term memory;Decreased recall of precautions               Extremity/Trunk Assessment               Exercises    Shoulder Instructions       General Comments      Pertinent Vitals/ Pain        Pain Score: 3  Faces Pain Scale: Hurts a little bit Pain Location: R hip Pain Descriptors / Indicators: Sore Pain Intervention(s): Limited activity within patient's tolerance;Monitored during session;Premedicated before session;Repositioned  Home Living                                          Prior Functioning/Environment              Frequency  Min 2X/week        Progress Toward Goals  OT Goals(current goals can now be found in the care plan section)  Progress towards OT goals: Progressing toward goals  Acute Rehab OT Goals Patient Stated Goal: return to being independent and caregiver for wife  Plan      Co-evaluation                 End of Session     Activity Tolerance Patient tolerated treatment well   Patient Left in chair;with call bell/phone within reach;with chair alarm set;with family/visitor present   Nurse Communication          Time: 1133-1200 OT Time Calculation (min): 27 min  Charges: OT General Charges $OT Visit: 1 Procedure OT Treatments $Self Care/Home Management : 23-37 mins  Elyse Prevo 10/11/2015, 12:33 PM Lesle Chris, OTR/L [phone] 10/11/2015

## 2015-10-11 NOTE — Progress Notes (Signed)
Physical Therapy Treatment Patient Details Name: [name] [name] MRN: [id number] DOB: [birth year] Today's Date: 10/11/2015    History of Present Illness s/p R DA THA    PT Comments    Pt continues to progress with mobility but also continues to be ltd by poor retention of posterior THP.  Follow Up Recommendations  Home health PT;Supervision/Assistance - 24 hour     Equipment Recommendations  Rolling walker with 5" wheels    Recommendations for Other Services OT consult     Precautions / Restrictions Precautions Precautions: Posterior Hip;Fall Precaution Booklet Issued: Yes (comment) Precaution Comments: Pt unable to recall any posterior THP.  Reviewed all with pt and dtr Restrictions Weight Bearing Restrictions: Yes RLE Weight Bearing: Partial weight bearing RLE Partial Weight Bearing Percentage or Pounds: 50%    Mobility  Bed Mobility               General bed mobility comments: Pt oob and declines return to bed  Transfers Overall transfer level: Needs assistance Equipment used: Rolling walker (2 wheeled) Transfers: Sit to/from Stand Sit to Stand: Min guard         General transfer comment: cues for LE managment, use of UEs to self assist and adherence to THP  Ambulation/Gait Ambulation/Gait assistance: Min guard Ambulation Distance (Feet): 118 Feet Assistive device: Rolling walker (2 wheeled) Gait Pattern/deviations: Step-to pattern;Shuffle;Trunk flexed Gait velocity: decr Gait velocity interpretation: Below normal speed for age/gender General Gait Details: cues for sequence, posture, position from RW and PWB   Stairs Stairs: Yes Stairs assistance: Min assist Stair Management: Two rails;Step to pattern;Forwards Number of Stairs: 5 General stair comments: cues for sequence and foot placement  Wheelchair Mobility    Modified Rankin (Stroke Patients Only)       Balance Overall balance assessment: Needs assistance Sitting-balance support: No  upper extremity supported;Feet supported Sitting balance-Leahy Scale: Good     Standing balance support: No upper extremity supported Standing balance-Leahy Scale: Fair                      Cognition Arousal/Alertness: Awake/alert Behavior During Therapy: WFL for tasks assessed/performed Overall Cognitive Status: History of cognitive impairments - at baseline       Memory: Decreased short-term memory;Decreased recall of precautions              Exercises      General Comments        Pertinent Vitals/Pain Pain Assessment: Faces Faces Pain Scale: Hurts a little bit Pain Location: R hip Pain Descriptors / Indicators: Sore Pain Intervention(s): Limited activity within patient's tolerance;Monitored during session;Premedicated before session;Ice applied    Home Living                      Prior Function            PT Goals (current goals can now be found in the care plan section) Acute Rehab PT Goals Patient Stated Goal: return to being independent and caregiver for wife PT Goal Formulation: With patient Time For Goal Achievement: 10/14/15 Potential to Achieve Goals: Good Progress towards PT goals: Progressing toward goals    Frequency    7X/week      PT Plan Current plan remains appropriate    Co-evaluation             End of Session Equipment Utilized During Treatment: Gait belt Activity Tolerance: Patient tolerated treatment well Patient left: in chair;with call bell/phone within reach;with  chair alarm set;with family/visitor present     Time: [id number] PT Time Calculation (min) (ACUTE ONLY): 26 min  Charges:  $Gait Training: 8-22 mins $Therapeutic Activity: 8-22 mins                    G Codes:      Lyndsey Demos October 22, 2015, 4:48 PM

## 2015-10-11 NOTE — Progress Notes (Signed)
   Subjective: 2 Days Post-Op Procedure(s) (LRB): RIGHT TOTAL HIP ARTHROPLASTY (Right) Patient reports pain as mild.   Patient seen in rounds for Dr. Gladstone Lighter. Patient is well, and has had no acute complaints or problems other than some discomfort in the right hip. No SOB or chest pain. No issues overnight. Family has some concerns about the DC plan.    Objective: Vital signs in last 24 hours: Temp:  [98.3 F (36.8 C)-98.9 F (37.2 C)] 98.3 F (36.8 C) (09/29 0513) Pulse Rate:  [70-81] 81 (09/29 0513) Resp:  [16-18] 16 (09/29 0513) BP: (109-140)/(69-93) 126/74 (09/29 0513) SpO2:  [97 %-100 %] 97 % (09/29 0513)  Intake/Output from previous day:  Intake/Output Summary (Last 24 hours) at 10/11/15 0753 Last data filed at 10/11/15 0514  Gross per 24 hour  Intake              220 ml  Output             1200 ml  Net             -980 ml     Labs:  Recent Labs  10/10/15 0450 10/11/15 0638  HGB 10.8* 11.5*    Recent Labs  10/10/15 0450 10/11/15 0638  WBC 12.1* 8.9  RBC 3.63* 3.88*  HCT 30.7* 33.7*  PLT 131* 128*    Recent Labs  10/10/15 0450 10/11/15 0638  NA 136 138  K 4.2 3.9  CL 107 108  CO2 25 25  BUN 22* 17  CREATININE 0.83 0.75  GLUCOSE 118* 88  CALCIUM 8.1* 8.3*    Recent Labs  10/09/15 0550  INR 0.96    EXAM General - Patient is Alert and Oriented Extremity - Neurologically intact Intact pulses distally Dorsiflexion/Plantar flexion intact No cellulitis present Compartment soft Dressing/Incision - clean, dry, no drainage Motor Function - intact, moving foot and toes well on exam.   Past Medical History:  Diagnosis Date  . Aortic stenosis   . Arthritis   . CHF (congestive heart failure) (HCC)    takes Furosemide daily  . Coronary artery disease   . Glaucoma    uses eye drops daily  . Heart murmur   . Hyperlipidemia   . Hypertension     Assessment/Plan: 2 Days Post-Op Procedure(s) (LRB): RIGHT TOTAL HIP ARTHROPLASTY  (Right) Active Problems:   H/O total hip arthroplasty  Estimated body mass index is 23.99 kg/m as calculated from the following:   Height as of this encounter: 5\' 11"  (1.803 m).   Weight as of this encounter: 78 kg (172 lb). Advance diet Up with therapy Discharge home with home health when ready  DVT Prophylaxis - Eliquis PWB 50% right LE  Discussed discharge options with the patient and his family. The concern for DC home is the likelihood of him trying to help lift his wife who requires assistance as a result of a stroke. Decision was made that as long as the patient can demonstrate the ability to get up and ambulate on his own with a walker, he will go home, whether that is today or tomorrow. Continue therapy today.    Ardeen Jourdain, PA-C Orthopaedic Surgery 10/11/2015, 7:53 AM

## 2015-10-11 NOTE — Discharge Instructions (Signed)
INSTRUCTIONS AFTER JOINT REPLACEMENT   o Remove items at home which could result in a fall. This includes throw rugs or furniture in walking pathways o ICE to the affected joint every three hours while awake for 30 minutes at a time, for at least the first 3-5 days, and then as needed for pain and swelling.  Continue to use ice for pain and swelling. You may notice swelling that will progress down to the foot and ankle.  This is normal after surgery.  Elevate your leg when you are not up walking on it.   o Continue to use the breathing machine you got in the hospital (incentive spirometer) which will help keep your temperature down.  It is common for your temperature to cycle up and down following surgery, especially at night when you are not up moving around and exerting yourself.  The breathing machine keeps your lungs expanded and your temperature down.   DIET:  As you were doing prior to hospitalization, we recommend a well-balanced diet.  DRESSING / WOUND CARE / SHOWERING  Keep the surgical dressing until follow up.  The dressing is water proof, so you can shower without any extra covering.  IF THE DRESSING FALLS OFF or the wound gets wet inside, change the dressing with sterile gauze.  Please use good hand washing techniques before changing the dressing.  Do not use any lotions or creams on the incision until instructed by your surgeon.    ACTIVITY  o Increase activity slowly as tolerated, but follow the weight bearing instructions below.   o No driving for 6 weeks or until further direction given by your physician.  You cannot drive while taking narcotics.  o No lifting or carrying greater than 10 lbs. until further directed by your surgeon. o Avoid periods of inactivity such as sitting longer than an hour when not asleep. This helps prevent blood clots.  o You may return to work once you are authorized by your doctor.   HIP PRECAUTIONS No bending hip over 90 degrees- A "L" Angle Do not  cross legs Do not let foot roll inward When turning these patients a pillow should be placed between the patient's legs to prevent crossing. Patients should have the affected knee fully extended when trying to sit or stand from all surfaces to prevent excessive hip flexion. When ambulating and turning toward the affected side the affected leg should have the toes turned out prior to moving the walker and the rest of patient's body as to prevent internal rotation/ turning in of the leg. Abduction pillows are the most effective way to prevent a patient from not crossing legs or turning toes in at rest. If an abduction pillow is not ordered placing a regular pillow length wise between the patient's legs is also an effective reminder. It is imperative that these precautions be maintained so that the surgical hip does not dislocate.  WEIGHT BEARING   Weight bearing as tolerated with assist device (walker, cane, etc) as directed, use it as long as suggested by your surgeon or therapist, typically at least 4-6 weeks.   EXERCISES  Results after joint replacement surgery are often greatly improved when you follow the exercise, range of motion and muscle strengthening exercises prescribed by your doctor. Safety measures are also important to protect the joint from further injury. Any time any of these exercises cause you to have increased pain or swelling, decrease what you are doing until you are comfortable again and then  slowly increase them. If you have problems or questions, call your caregiver or physical therapist for advice.   Rehabilitation is important following a joint replacement. After just a few days of immobilization, the muscles of the leg can become weakened and shrink (atrophy).  These exercises are designed to build up the tone and strength of the thigh and leg muscles and to improve motion. Often times heat used for twenty to thirty minutes before working out will loosen up your tissues  and help with improving the range of motion but do not use heat for the first two weeks following surgery (sometimes heat can increase post-operative swelling).   These exercises can be done on a training (exercise) mat, on the floor, on a table or on a bed. Use whatever works the best and is most comfortable for you.    Use music or television while you are exercising so that the exercises are a pleasant break in your day. This will make your life better with the exercises acting as a break in your routine that you can look forward to.   Perform all exercises about fifteen times, three times per day or as directed.  You should exercise both the operative leg and the other leg as well.  Exercises include:    Quad Sets - Tighten up the muscle on the front of the thigh (Quad) and hold for 5-10 seconds.    Straight Leg Raises - With your knee straight (if you were given a brace, keep it on), lift the leg to 60 degrees, hold for 3 seconds, and slowly lower the leg.  Perform this exercise against resistance later as your leg gets stronger.   Leg Slides: Lying on your back, slowly slide your foot toward your buttocks, bending your knee up off the floor (only go as far as is comfortable). Then slowly slide your foot back down until your leg is flat on the floor again.   Angel Wings: Lying on your back spread your legs to the side as far apart as you can without causing discomfort.   Hamstring Strength:  Lying on your back, push your heel against the floor with your leg straight by tightening up the muscles of your buttocks.  Repeat, but this time bend your knee to a comfortable angle, and push your heel against the floor.  You may put a pillow under the heel to make it more comfortable if necessary.   A rehabilitation program following joint replacement surgery can speed recovery and prevent re-injury in the future due to weakened muscles. Contact your doctor or a physical therapist for more information on  knee rehabilitation.    CONSTIPATION  Constipation is defined medically as fewer than three stools per week and severe constipation as less than one stool per week.  Even if you have a regular bowel pattern at home, your normal regimen is likely to be disrupted due to multiple reasons following surgery.  Combination of anesthesia, postoperative narcotics, change in appetite and fluid intake all can affect your bowels.   YOU MUST use at least one of the following options; they are listed in order of increasing strength to get the job done.  They are all available over the counter, and you may need to use some, POSSIBLY even all of these options:    Drink plenty of fluids (prune juice may be helpful) and high fiber foods Colace 100 mg by mouth twice a day  Senokot for constipation as directed and as  needed Dulcolax (bisacodyl), take with full glass of water  Miralax (polyethylene glycol) once or twice a day as needed.  If you have tried all these things and are unable to have a bowel movement in the first 3-4 days after surgery call either your surgeon or your primary doctor.    If you experience loose stools or diarrhea, hold the medications until you stool forms back up.  If your symptoms do not get better within 1 week or if they get worse, check with your doctor.  If you experience "the worst abdominal pain ever" or develop nausea or vomiting, please contact the office immediately for further recommendations for treatment.   ITCHING:  If you experience itching with your medications, try taking only a single pain pill, or even half a pain pill at a time.  You can also use Benadryl over the counter for itching or also to help with sleep.   TED HOSE STOCKINGS:  Use stockings on both legs until for at least 2 weeks or as directed by physician office. They may be removed at night for sleeping.  MEDICATIONS:  See your medication summary on the After Visit Summary that nursing will review with  you.  You may have some home medications which will be placed on hold until you complete the course of blood thinner medication.  It is important for you to complete the blood thinner medication as prescribed.  PRECAUTIONS:  If you experience chest pain or shortness of breath - call 911 immediately for transfer to the hospital emergency department.   If you develop a fever greater that 101 F, purulent drainage from wound, increased redness or drainage from wound, foul odor from the wound/dressing, or calf pain - CONTACT YOUR SURGEON.                                                   FOLLOW-UP APPOINTMENTS:  If you do not already have a post-op appointment, please call the office for an appointment to be seen by your surgeon.  Guidelines for how soon to be seen are listed in your After Visit Summary, but are typically between 1-4 weeks after surgery.   MAKE SURE YOU:   Understand these instructions.   Get help right away if you are not doing well or get worse.    Thank you for letting us be a part of your medical care team.  It is a privilege we respect greatly.  We hope these instructions will help you stay on track for a fast and full recovery!

## 2015-10-11 NOTE — Progress Notes (Signed)
Physical Therapy Treatment Patient Details Name: [name] [name] MRN: [id number] DOB: 10-01-1940 Today's Date: 10/11/2015    History of Present Illness s/p R DA THA    PT Comments    Pt progressing well with mobility but continues to struggle with retention of and follow through with THP.  Follow Up Recommendations  Home health PT;Supervision/Assistance - 24 hour     Equipment Recommendations  Rolling walker with 5" wheels    Recommendations for Other Services OT consult     Precautions / Restrictions Precautions Precautions: Posterior Hip;Fall Precaution Booklet Issued: Yes (comment) Precaution Comments: Pt unable to recall any posterior THP.  Reviewed all with pt and dtr Restrictions Weight Bearing Restrictions: Yes RLE Weight Bearing: Partial weight bearing RLE Partial Weight Bearing Percentage or Pounds: 50%    Mobility  Bed Mobility Overal bed mobility: Needs Assistance Bed Mobility: Supine to Sit     Supine to sit: Min guard     General bed mobility comments: min cues for sequence and adherence to THP   Transfers Overall transfer level: Needs assistance Equipment used: Rolling walker (2 wheeled) Transfers: Sit to/from Stand Sit to Stand: Min guard         General transfer comment: cues for LE managment, use of UEs to self assist and adherence to THP  Ambulation/Gait Ambulation/Gait assistance: Min guard Ambulation Distance (Feet): 100 Feet Assistive device: Rolling walker (2 wheeled) Gait Pattern/deviations: Step-to pattern;Shuffle;Trunk flexed Gait velocity: decr Gait velocity interpretation: Below normal speed for age/gender General Gait Details: cues for sequence, posture, position from RW and PWB   Stairs            Wheelchair Mobility    Modified Rankin (Stroke Patients Only)       Balance                                    Cognition Arousal/Alertness: Awake/alert Behavior During Therapy: WFL for tasks  assessed/performed Overall Cognitive Status: History of cognitive impairments - at baseline       Memory: Decreased short-term memory;Decreased recall of precautions              Exercises Total Joint Exercises Ankle Circles/Pumps: AROM;Both;15 reps;Supine Quad Sets: AROM;Both;10 reps;Supine Heel Slides: AAROM;Right;15 reps;Supine Hip ABduction/ADduction: AAROM;Right;15 reps;Supine    General Comments        Pertinent Vitals/Pain Pain Assessment: Faces Pain Score: 3  Pain Location: R hip Pain Descriptors / Indicators: Aching;Sore Pain Intervention(s): Limited activity within patient's tolerance;Monitored during session;Premedicated before session;Ice applied    Home Living                      Prior Function            PT Goals (current goals can now be found in the care plan section) Acute Rehab PT Goals Patient Stated Goal: return to being independent and caregiver for wife PT Goal Formulation: With patient Time For Goal Achievement: 10/14/15 Potential to Achieve Goals: Good Progress towards PT goals: Progressing toward goals    Frequency    7X/week      PT Plan Current plan remains appropriate    Co-evaluation             End of Session Equipment Utilized During Treatment: Gait belt Activity Tolerance: Patient tolerated treatment well Patient left: in chair;with call bell/phone within reach;with chair alarm set;with family/visitor present  Time: [id number] PT Time Calculation (min) (ACUTE ONLY): 38 min  Charges:  $Gait Training: 8-22 mins $Therapeutic Exercise: 8-22 mins                    G Codes:      [name] [name] 11/08/15, 12:15 PM

## 2015-10-12 LAB — CBC
HCT: 30.9 % — ABNORMAL LOW (ref 39.0–52.0)
Hemoglobin: 10.5 g/dL — ABNORMAL LOW (ref 13.0–17.0)
MCH: 28.8 pg (ref 26.0–34.0)
MCHC: 34 g/dL (ref 30.0–36.0)
MCV: 84.7 fL (ref 78.0–100.0)
PLATELETS: 134 10*3/uL — AB (ref 150–400)
RBC: 3.65 MIL/uL — ABNORMAL LOW (ref 4.22–5.81)
RDW: 13.2 % (ref 11.5–15.5)
WBC: 8.3 10*3/uL (ref 4.0–10.5)

## 2015-10-12 NOTE — Progress Notes (Signed)
Occupational Therapy Treatment Patient Details Name: [name] [name] MRN: [id number] DOB: 01-21-1940 Today's Date: 10/12/2015    History of present illness s/p R DA THA   OT comments  Pt and son educated and practiced LB dressing with extra long reacher, toilet and shower transfer maintaining weight bearing and posterior precautions. Pt continues to not recall hip precautions, but son is knowledgeable. Son to purchase AE in gift shop and swap out short reacher for extra long.   Follow Up Recommendations  Supervision/Assistance - 24 hour;Home health OT    Equipment Recommendations  None recommended by OT    Recommendations for Other Services      Precautions / Restrictions Precautions Precautions: Posterior Hip;Fall Precaution Booklet Issued: Yes (comment) Precaution Comments: Pt not able to state any precautions, reviewed Required Braces or Orthoses:  (KI in room, recommended pt use at night for precautions) Restrictions Weight Bearing Restrictions: Yes RLE Weight Bearing: Partial weight bearing RLE Partial Weight Bearing Percentage or Pounds: 50%       Mobility Bed Mobility      General bed mobility comments: pt in chair  Transfers Overall transfer level: Needs assistance Equipment used: Rolling walker (2 wheeled) Transfers: Sit to/from Stand Sit to Stand: Supervision         General transfer comment: cues for LE managment, use of UEs to self assist and adherence to THP    Balance     Sitting balance-Leahy Scale: Good       Standing balance-Leahy Scale: Fair                     ADL Overall ADL's : Needs assistance/impaired               Lower Body Bathing Details (indicate cue type and reason): reinforced need to use long handled bath sponge     Lower Body Dressing: Supervision/safety;Sit to/from stand Lower Body Dressing Details (indicate cue type and reason): practiced LB dressing with extra long reacher, dressing operated leg  first Toilet Transfer: Supervision/safety;Ambulation;BSC;RW (BSC over toilet)   Toileting- Clothing Manipulation and Hygiene: Supervision/safety;Sit to/from stand Toileting - Clothing Manipulation Details (indicate cue type and reason): instructed to avoid twisting hip with pericare Tub/ Shower Transfer: Walk-in shower;Min guard;Adhering to hip precautions;Cueing for sequencing;Anterior/posterior;Rolling walker Tub/Shower Transfer Details (indicate cue type and reason): pt has a built in seat, educated son to make sure it is high enough and easy to stand, otherwise to use 3 in 1 as shower seat Functional mobility during ADLs: Supervision/safety;Rolling walker General ADL Comments: Son present today for education.       Vision                     Perception     Praxis      Cognition   Behavior During Therapy: [facility] for tasks assessed/performed Overall Cognitive Status: History of cognitive impairments - at baseline       Memory: Decreased short-term memory;Decreased recall of precautions               Extremity/Trunk Assessment               Exercises Total Joint Exercises Ankle Circles/Pumps: AROM;Both;15 reps;Supine Quad Sets: AROM;Both;10 reps;Supine Heel Slides: AAROM;Right;Supine;20 reps Hip ABduction/ADduction: AAROM;Right;Supine;20 reps Long Arc Quad: AROM;Right;10 reps;Supine   Shoulder Instructions       General Comments      Pertinent Vitals/ Pain       Pain Assessment: Faces Faces Pain Scale:  Hurts a little bit Pain Location: R hip Pain Descriptors / Indicators: Guarding Pain Intervention(s): Monitored during session;Premedicated before session;Ice applied  Home Living                                          Prior Functioning/Environment              Frequency  Min 2X/week        Progress Toward Goals  OT Goals(current goals can now be found in the care plan section)  Progress towards OT goals:  Progressing toward goals  Acute Rehab OT Goals Patient Stated Goal: return to being independent and caregiver for wife Time For Goal Achievement: 10/17/15 Potential to Achieve Goals: Good  Plan Discharge plan remains appropriate    Co-evaluation                 End of Session Equipment Utilized During Treatment: Gait belt;Rolling walker   Activity Tolerance Patient tolerated treatment well   Patient Left in chair;with call bell/phone within reach;with family/visitor present   Nurse Communication  (ready for d/c)        Time: 1008-1027 OT Time Calculation (min): 19 min  Charges: OT General Charges $OT Visit: 1 Procedure OT Treatments $Self Care/Home Management : 8-22 mins  Malka So 10/12/2015, 11:42 AM  [phone]

## 2015-10-12 NOTE — Progress Notes (Signed)
Subjective: 3 Days Post-Op Procedure(s) (LRB): RIGHT TOTAL HIP ARTHROPLASTY (Right) Patient reports pain as 1 on 0-10 scale. Doing very well. Will DC today.   Objective: Vital signs in last 24 hours: Temp:  [97.9 F (36.6 C)-98.4 F (36.9 C)] 97.9 F (36.6 C) (09/30 0454) Pulse Rate:  [69-79] 69 (09/30 0454) Resp:  [16] 16 (09/30 0454) BP: (105-122)/(76-81) 121/81 (09/30 0454) SpO2:  [98 %-100 %] 98 % (09/30 0454)  Intake/Output from previous day: 09/29 0701 - 09/30 0700 In: 720 [P.O.:720] Out: 300 [Urine:300] Intake/Output this shift: No intake/output data recorded.   Recent Labs  10/10/15 0450 10/11/15 0638 10/12/15 0425  HGB 10.8* 11.5* 10.5*    Recent Labs  10/11/15 0638 10/12/15 0425  WBC 8.9 8.3  RBC 3.88* 3.65*  HCT 33.7* 30.9*  PLT 128* 134*    Recent Labs  10/10/15 0450 10/11/15 0638  NA 136 138  K 4.2 3.9  CL 107 108  CO2 25 25  BUN 22* 17  CREATININE 0.83 0.75  GLUCOSE 118* 88  CALCIUM 8.1* 8.3*   No results for input(s): LABPT, INR in the last 72 hours.  Neurologically intact No cellulitis present Compartment soft  Assessment/Plan: 3 Days Post-Op Procedure(s) (LRB): RIGHT TOTAL HIP ARTHROPLASTY (Right) Up with therapy Discharge home with home health  Berneda Piccininni A 10/12/2015, 7:49 AM

## 2015-10-12 NOTE — Progress Notes (Signed)
Physical Therapy Treatment Patient Details Name: [name] [name] MRN: [id number] DOB: 06-08-1940 Today's Date: 10/12/2015    History of Present Illness s/p R DA THA    PT Comments    Pt continues to progress well with mobility and with some retention of THP carried over from yesterday.  Reviewed car transfers, stairs and home therex with pt and son.  Follow Up Recommendations  Home health PT;Supervision/Assistance - 24 hour     Equipment Recommendations  Rolling walker with 5" wheels    Recommendations for Other Services OT consult     Precautions / Restrictions Precautions Precautions: Posterior Hip;Fall Precaution Booklet Issued: Yes (comment) Precaution Comments: Pt recalls 1/3 THR without cues.  Reviewed all THP with pt and son Restrictions Weight Bearing Restrictions: Yes RLE Weight Bearing: Partial weight bearing RLE Partial Weight Bearing Percentage or Pounds: 50%    Mobility  Bed Mobility Overal bed mobility: Needs Assistance Bed Mobility: Supine to Sit     Supine to sit: Supervision     General bed mobility comments: cues for sequence and use of L LE to self assist.  Pt accomplised task without physical assist and without breaking any THP  Transfers Overall transfer level: Needs assistance Equipment used: Rolling walker (2 wheeled) Transfers: Sit to/from Stand Sit to Stand: Min guard         General transfer comment: cues for LE managment, use of UEs to self assist and adherence to THP  Ambulation/Gait Ambulation/Gait assistance: Min guard;Supervision Ambulation Distance (Feet): 123 Feet Assistive device: Rolling walker (2 wheeled) Gait Pattern/deviations: Step-to pattern;Shuffle;Trunk flexed Gait velocity: decr Gait velocity interpretation: Below normal speed for age/gender General Gait Details: min cues for sequence, posture, position from RW and PWB   Stairs Stairs: Yes Stairs assistance: Min assist Stair Management: Two rails;Step to  pattern;Forwards Number of Stairs: 2 General stair comments: cues for sequence and foot placement  Wheelchair Mobility    Modified Rankin (Stroke Patients Only)       Balance                                    Cognition Arousal/Alertness: Awake/alert Behavior During Therapy: WFL for tasks assessed/performed Overall Cognitive Status: History of cognitive impairments - at baseline       Memory: Decreased short-term memory;Decreased recall of precautions              Exercises Total Joint Exercises Ankle Circles/Pumps: AROM;Both;15 reps;Supine Quad Sets: AROM;Both;10 reps;Supine Heel Slides: AAROM;Right;Supine;20 reps Hip ABduction/ADduction: AAROM;Right;Supine;20 reps Long Arc Quad: AROM;Right;10 reps;Supine    General Comments        Pertinent Vitals/Pain Pain Assessment: Faces Faces Pain Scale: Hurts little more Pain Location: R hip Pain Descriptors / Indicators: Aching;Sore Pain Intervention(s): Limited activity within patient's tolerance;Monitored during session;Ice applied;Patient requesting pain meds-RN notified    Home Living                      Prior Function            PT Goals (current goals can now be found in the care plan section) Acute Rehab PT Goals Patient Stated Goal: return to being independent and caregiver for wife PT Goal Formulation: With patient Time For Goal Achievement: 10/14/15 Potential to Achieve Goals: Good Progress towards PT goals: Progressing toward goals    Frequency    7X/week      PT Plan Current  plan remains appropriate    Co-evaluation             End of Session Equipment Utilized During Treatment: Gait belt Activity Tolerance: Patient tolerated treatment well Patient left: in chair;with call bell/phone within reach;with chair alarm set;with family/visitor present     Time: [id number] PT Time Calculation (min) (ACUTE ONLY): 50 min  Charges:  $Gait Training: 8-22  mins $Therapeutic Exercise: 8-22 mins                    G Codes:      [name] [name] 24-Oct-2015, 9:50 AM

## 2015-10-12 NOTE — Progress Notes (Signed)
dsicharged from floor via w/c for transport home by car. Belongings & son with pt. No changes in assessment. Keydi Giel, [employer]

## 2015-10-13 LAB — TYPE AND SCREEN
ABO/RH(D): A POS
Antibody Screen: POSITIVE
DAT, IGG: NEGATIVE
Unit division: 0
Unit division: 0

## 2015-10-14 DIAGNOSIS — I110 Hypertensive heart disease with heart failure: Secondary | ICD-10-CM | POA: Diagnosis not present

## 2015-10-14 DIAGNOSIS — I2510 Atherosclerotic heart disease of native coronary artery without angina pectoris: Secondary | ICD-10-CM | POA: Diagnosis not present

## 2015-10-14 DIAGNOSIS — I509 Heart failure, unspecified: Secondary | ICD-10-CM | POA: Diagnosis not present

## 2015-10-14 DIAGNOSIS — H409 Unspecified glaucoma: Secondary | ICD-10-CM | POA: Diagnosis not present

## 2015-10-14 DIAGNOSIS — I491 Atrial premature depolarization: Secondary | ICD-10-CM | POA: Diagnosis not present

## 2015-10-14 DIAGNOSIS — Z951 Presence of aortocoronary bypass graft: Secondary | ICD-10-CM | POA: Diagnosis not present

## 2015-10-14 DIAGNOSIS — Z96641 Presence of right artificial hip joint: Secondary | ICD-10-CM | POA: Diagnosis not present

## 2015-10-14 DIAGNOSIS — E7800 Pure hypercholesterolemia, unspecified: Secondary | ICD-10-CM | POA: Diagnosis not present

## 2015-10-14 DIAGNOSIS — I493 Ventricular premature depolarization: Secondary | ICD-10-CM | POA: Diagnosis not present

## 2015-10-14 DIAGNOSIS — Z954 Presence of other heart-valve replacement: Secondary | ICD-10-CM | POA: Diagnosis not present

## 2015-10-14 DIAGNOSIS — Z7901 Long term (current) use of anticoagulants: Secondary | ICD-10-CM | POA: Diagnosis not present

## 2015-10-14 DIAGNOSIS — Z471 Aftercare following joint replacement surgery: Secondary | ICD-10-CM | POA: Diagnosis not present

## 2015-10-14 NOTE — Discharge Summary (Signed)
Physician Discharge Summary   Patient ID: [name] [name] MRN: [id number] DOB/AGE: 20-Feb-1940 75 y.o.  Admit date: 10/09/2015 Discharge date: 10/12/2015  Primary Diagnosis: Primary osteoarthritis right hip  Admission Diagnoses:  Past Medical History:  Diagnosis Date  . Aortic stenosis   . Arthritis   . CHF (congestive heart failure) (HCC)    takes Furosemide daily  . Coronary artery disease   . Glaucoma    uses eye drops daily  . Heart murmur   . Hyperlipidemia   . Hypertension    Discharge Diagnoses:   Active Problems:   H/O total hip arthroplasty  Estimated body mass index is 23.99 kg/m as calculated from the following:   Height as of this encounter: _0  (1.803 m).   Weight as of this encounter: 78 kg (172 lb).  Procedure(s) (LRB): RIGHT TOTAL HIP ARTHROPLASTY (Right)   Consults: None  HPI: [name] [name], 75 y.o. male, has a history of pain and functional disability in the right hip(s) due to arthritis and patient has failed non-surgical conservative treatments for greater than 12 weeks to include NSAID's and/or analgesics, corticosteriod injections, flexibility and strengthening excercises, use of assistive devices and activity modification.  Onset of symptoms was gradual starting 2 years ago with gradually worsening course since that time.The patient noted no past surgery on the right hip(s).  Patient currently rates pain in the right hip at 8 out of 10 with activity. Patient has night pain, worsening of pain with activity and weight bearing, pain that interfers with activities of daily living, pain with passive range of motion and crepitus. Patient has evidence of subchondral cysts, subchondral sclerosis, periarticular osteophytes and joint space narrowing by imaging studies. This condition presents safety issues increasing the risk of falls. There is no current active infection.  Laboratory Data: Admission on 10/09/2015, Discharged on 10/12/2015  Component Date Value Ref  Range Status  . Prothrombin Time 10/09/2015 12.8  11.4 - 15.2 seconds Final  . INR 10/09/2015 0.96   Final  . ABO/RH(D) 10/13/2015 A POS   Final  . Antibody Screen 10/13/2015 POS   Final  . Sample Expiration 10/13/2015 10/12/2015   Final  . DAT, IgG 10/13/2015 NEG   Final  . Unit Number 10/13/2015 [id number]   Final  . Blood Component Type 10/13/2015 RBC LR PHER2   Final  . Unit division 10/13/2015 00   Final  . Status of Unit 10/13/2015 REL FROM [facility]   Final  . Transfusion Status 10/13/2015 OK TO TRANSFUSE   Final  . Crossmatch Result 10/13/2015 COMPATIBLE   Final  . Unit Number 10/13/2015 [id number]   Final  . Blood Component Type 10/13/2015 RBC LR PHER2   Final  . Unit division 10/13/2015 00   Final  . Status of Unit 10/13/2015 REL FROM [facility]   Final  . Transfusion Status 10/13/2015 OK TO TRANSFUSE   Final  . Crossmatch Result 10/13/2015 COMPATIBLE   Final  . WBC 10/10/2015 12.1* 4.0 - 10.5 K/uL Final  . RBC 10/10/2015 3.63* 4.22 - 5.81 MIL/uL Final  . Hemoglobin 10/10/2015 10.8* 13.0 - 17.0 g/dL Final  . HCT 10/10/2015 30.7* 39.0 - 52.0 % Final  . MCV 10/10/2015 84.6  78.0 - 100.0 fL Final  . MCH 10/10/2015 29.8  26.0 - 34.0 pg Final  . MCHC 10/10/2015 35.2  30.0 - 36.0 g/dL Final  . RDW 10/10/2015 13.1  11.5 - 15.5 % Final  . Platelets 10/10/2015 131* 150 - 400 K/uL Final  .  Sodium 10/10/2015 136  135 - 145 mmol/L Final  . Potassium 10/10/2015 4.2  3.5 - 5.1 mmol/L Final  . Chloride 10/10/2015 107  101 - 111 mmol/L Final  . CO2 10/10/2015 25  22 - 32 mmol/L Final  . Glucose, Bld 10/10/2015 118* 65 - 99 mg/dL Final  . BUN 10/10/2015 22* 6 - 20 mg/dL Final  . Creatinine, Ser 10/10/2015 0.83  0.61 - 1.24 mg/dL Final  . Calcium 10/10/2015 8.1* 8.9 - 10.3 mg/dL Final  . GFR calc non Af Amer 10/10/2015 >60  >60 mL/min Final  . GFR calc Af Amer 10/10/2015 >60  >60 mL/min Final   Comment: (NOTE) The eGFR has been calculated using the CKD EPI equation. This calculation has  not been validated in all clinical situations. eGFR's persistently <60 mL/min signify possible Chronic Kidney Disease.   . Anion gap 10/10/2015 4* 5 - 15 Final  . WBC 10/11/2015 8.9  4.0 - 10.5 K/uL Final  . RBC 10/11/2015 3.88* 4.22 - 5.81 MIL/uL Final  . Hemoglobin 10/11/2015 11.5* 13.0 - 17.0 g/dL Final  . HCT 10/11/2015 33.7* 39.0 - 52.0 % Final  . MCV 10/11/2015 86.9  78.0 - 100.0 fL Final  . MCH 10/11/2015 29.6  26.0 - 34.0 pg Final  . MCHC 10/11/2015 34.1  30.0 - 36.0 g/dL Final  . RDW 10/11/2015 13.5  11.5 - 15.5 % Final  . Platelets 10/11/2015 128* 150 - 400 K/uL Final  . Sodium 10/11/2015 138  135 - 145 mmol/L Final  . Potassium 10/11/2015 3.9  3.5 - 5.1 mmol/L Final  . Chloride 10/11/2015 108  101 - 111 mmol/L Final  . CO2 10/11/2015 25  22 - 32 mmol/L Final  . Glucose, Bld 10/11/2015 88  65 - 99 mg/dL Final  . BUN 10/11/2015 17  6 - 20 mg/dL Final  . Creatinine, Ser 10/11/2015 0.75  0.61 - 1.24 mg/dL Final  . Calcium 10/11/2015 8.3* 8.9 - 10.3 mg/dL Final  . GFR calc non Af Amer 10/11/2015 >60  >60 mL/min Final  . GFR calc Af Amer 10/11/2015 >60  >60 mL/min Final   Comment: (NOTE) The eGFR has been calculated using the CKD EPI equation. This calculation has not been validated in all clinical situations. eGFR's persistently <60 mL/min signify possible Chronic Kidney Disease.   . Anion gap 10/11/2015 5  5 - 15 Final  . WBC 10/12/2015 8.3  4.0 - 10.5 K/uL Final  . RBC 10/12/2015 3.65* 4.22 - 5.81 MIL/uL Final  . Hemoglobin 10/12/2015 10.5* 13.0 - 17.0 g/dL Final  . HCT 10/12/2015 30.9* 39.0 - 52.0 % Final  . MCV 10/12/2015 84.7  78.0 - 100.0 fL Final  . MCH 10/12/2015 28.8  26.0 - 34.0 pg Final  . MCHC 10/12/2015 34.0  30.0 - 36.0 g/dL Final  . RDW 10/12/2015 13.2  11.5 - 15.5 % Final  . Platelets 10/12/2015 134* 150 - 400 K/uL Final  Hospital Outpatient Visit on 10/03/2015  Component Date Value Ref Range Status  . MRSA, PCR 10/03/2015 INVALID RESULTS, SPECIMEN SENT  FOR CULTURE* NEGATIVE Final  . Staphylococcus aureus 10/03/2015 INVALID RESULTS, SPECIMEN SENT FOR CULTURE* NEGATIVE Final   Comment: STANLEY,K @ [id number] ON [id number] BY POTEAT,S        The Xpert SA Assay (FDA approved for NASAL specimens in patients over 27 years of age), is one component of a comprehensive surveillance program.  Test performance has been validated by [facility] for patients greater than or equal  to 72 year old. It is not intended to diagnose infection nor to guide or monitor treatment.   Marland Kitchen aPTT 10/03/2015 27  24 - 36 seconds Final  . WBC 10/03/2015 8.6  4.0 - 10.5 K/uL Final  . RBC 10/03/2015 4.70  4.22 - 5.81 MIL/uL Final  . Hemoglobin 10/03/2015 13.8  13.0 - 17.0 g/dL Final  . HCT 10/03/2015 41.4  39.0 - 52.0 % Final  . MCV 10/03/2015 88.1  78.0 - 100.0 fL Final  . MCH 10/03/2015 29.4  26.0 - 34.0 pg Final  . MCHC 10/03/2015 33.3  30.0 - 36.0 g/dL Final  . RDW 10/03/2015 13.4  11.5 - 15.5 % Final  . Platelets 10/03/2015 164  150 - 400 K/uL Final  . Neutrophils Relative % 10/03/2015 71  % Final  . Neutro Abs 10/03/2015 6.1  1.7 - 7.7 K/uL Final  . Lymphocytes Relative 10/03/2015 19  % Final  . Lymphs Abs 10/03/2015 1.6  0.7 - 4.0 K/uL Final  . Monocytes Relative 10/03/2015 9  % Final  . Monocytes Absolute 10/03/2015 0.7  0.1 - 1.0 K/uL Final  . Eosinophils Relative 10/03/2015 1  % Final  . Eosinophils Absolute 10/03/2015 0.1  0.0 - 0.7 K/uL Final  . Basophils Relative 10/03/2015 0  % Final  . Basophils Absolute 10/03/2015 0.0  0.0 - 0.1 K/uL Final  . Sodium 10/03/2015 137  135 - 145 mmol/L Final  . Potassium 10/03/2015 4.7  3.5 - 5.1 mmol/L Final  . Chloride 10/03/2015 106  101 - 111 mmol/L Final  . CO2 10/03/2015 26  22 - 32 mmol/L Final  . Glucose, Bld 10/03/2015 86  65 - 99 mg/dL Final  . BUN 10/03/2015 22* 6 - 20 mg/dL Final  . Creatinine, Ser 10/03/2015 0.82  0.61 - 1.24 mg/dL Final  . Calcium 10/03/2015 8.9  8.9 - 10.3 mg/dL Final  . Total Protein  10/03/2015 6.7  6.5 - 8.1 g/dL Final  . Albumin 10/03/2015 4.1  3.5 - 5.0 g/dL Final  . AST 10/03/2015 18  15 - 41 U/L Final  . ALT 10/03/2015 21  17 - 63 U/L Final  . Alkaline Phosphatase 10/03/2015 77  38 - 126 U/L Final  . Total Bilirubin 10/03/2015 0.7  0.3 - 1.2 mg/dL Final  . GFR calc non Af Amer 10/03/2015 >60  >60 mL/min Final  . GFR calc Af Amer 10/03/2015 >60  >60 mL/min Final   Comment: (NOTE) The eGFR has been calculated using the CKD EPI equation. This calculation has not been validated in all clinical situations. eGFR's persistently <60 mL/min signify possible Chronic Kidney Disease.   . Anion gap 10/03/2015 5  5 - 15 Final  . Prothrombin Time 10/03/2015 12.9  11.4 - 15.2 seconds Final  . INR 10/03/2015 0.97   Final  . ABO/RH(D) 10/03/2015 A POS   Final  . Antibody Screen 10/03/2015 POS   Final  . Sample Expiration 10/03/2015 10/06/2015   Final  . Extend sample reason 10/03/2015 NO TRANSFUSIONS OR PREGNANCY IN THE PAST 3 MONTHS   Final  . DAT, IgG 10/03/2015 NEG   Final  . Antibody Identification [id number] NO CLINICALLY SIGNIFICANT ANTIBODY IDENTIFIED   Final  . Color, Urine 10/03/2015 YELLOW  YELLOW Final  . APPearance 10/03/2015 CLOUDY* CLEAR Final  . Specific Gravity, Urine 10/03/2015 1.011  1.005 - 1.030 Final  . pH 10/03/2015 5.5  5.0 - 8.0 Final  . Glucose, UA 10/03/2015 NEGATIVE  NEGATIVE mg/dL Final  .  Hgb urine dipstick 10/03/2015 NEGATIVE  NEGATIVE Final  . Bilirubin Urine 10/03/2015 NEGATIVE  NEGATIVE Final  . Ketones, ur 10/03/2015 NEGATIVE  NEGATIVE mg/dL Final  . Protein, ur 10/03/2015 NEGATIVE  NEGATIVE mg/dL Final  . Nitrite 10/03/2015 POSITIVE* NEGATIVE Final  . Leukocytes, UA 10/03/2015 LARGE* NEGATIVE Final  . Squamous Epithelial / LPF 10/03/2015 NONE SEEN  NONE SEEN Final  . WBC, UA 10/03/2015 6-30  0 - 5 WBC/hpf Final  . RBC / HPF 10/03/2015 NONE SEEN  0 - 5 RBC/hpf Final  . Bacteria, UA 10/03/2015 MANY* NONE SEEN Final  . Urine-Other  10/03/2015 MUCOUS PRESENT   Final  . Specimen Description 10/06/2015 NOSE   Final  . Special Requests 10/06/2015 NONE   Final  . Culture 10/06/2015    Final                   Value:NO MRSA DETECTED Performed at [facility]   . Report Status 10/06/2015 10/06/2015 FINAL   Final     X-Rays:Dg Chest 2 View  Result Date: 10/03/2015 CLINICAL DATA:  75 year old male with a history of preop hip replacement EXAM: CHEST  2 VIEW COMPARISON:  01/31/2014, CT 06/07/2014 FINDINGS: Cardiomediastinal silhouette unchanged in size and contour. Surgical changes of median sternotomy, CABG, and aortic valve replacement. Tortuosity of descending thoracic aorta is similar to comparison. Coarsened interstitial markings, similar to prior. No confluent airspace disease, pneumothorax, or pleural effusion. IMPRESSION: No radiographic evidence of acute cardiopulmonary disease. Surgical changes of prior median sternotomy, CABG, aortic valve replacement. Signed, Dulcy Fanny. Earleen Newport, DO [facility] Electronically Signed   By: Corrie Mckusick D.O.   On: 10/03/2015 10:07   Dg Hip Port Unilat With Pelvis 1v Right  Result Date: 10/09/2015 CLINICAL DATA:  Postop right hip. EXAM: DG HIP (WITH OR WITHOUT PELVIS) 1V PORT RIGHT COMPARISON:  None. FINDINGS: Single AP view of the right hip is provided. Patient is status post recent total hip arthroplasty. Hardware appears intact and appropriately positioned. Expected postsurgical changes within the overlying soft tissues. IMPRESSION: Status post right hip arthroplasty. Hardware appears appropriately positioned. No evidence of surgical complicating feature. Electronically Signed   By: Franki Cabot M.D.   On: 10/09/2015 09:49    EKG: Orders placed or performed in visit on 10/04/15  . EKG 12-Lead     Hospital Course: Patient was admitted to [facility] and taken to the OR and underwent the above state procedure without  complications.  Patient tolerated the procedure well and was later transferred to the recovery room and then to the orthopaedic floor for postoperative care.  They were given PO and IV analgesics for pain control following their surgery.  They were given 24 hours of postoperative antibiotics of  Anti-infectives    Start     Dose/Rate Route Frequency Ordered Stop   10/09/15 1400  ceFAZolin (ANCEF) IVPB 1 g/50 mL premix     1 g 100 mL/hr over 30 Minutes Intravenous Every 6 hours 10/09/15 1212 10/10/15 0121   10/09/15 0532  ceFAZolin (ANCEF) IVPB 2g/100 mL premix     2 g 200 mL/hr over 30 Minutes Intravenous On call to O.R. 10/09/15 0532 10/09/15 0736     and started on DVT prophylaxis in the form of Xarelto while inpatient and aspirin among discharge.   PT and OT were ordered for total hip protocol.  The patient was allowed to be WBAT with therapy. Discharge planning was consulted to  help with postop disposition and equipment needs.  Patient had a fair night on the evening of surgery.  They started to get up OOB with therapy on day one.  The knee immobilizer was removed and discontinued.  Continued to work with therapy into day two. The patient was doing well with ambulation but had issues with recall of hip precautions.  By day three, the patient had progressed with therapy and meeting their goals.  Incision was healing well.  Patient was seen in rounds and was ready to go home.   Diet: Cardiac diet Activity:WBAT No bending hip over 90 degrees- A "L" Angle Do not cross legs Do not let foot roll inward When turning these patients a pillow should be placed between the patient's legs to prevent crossing. Patients should have the affected knee fully extended when trying to sit or stand from all surfaces to prevent excessive hip flexion. When ambulating and turning toward the affected side the affected leg should have the toes turned out prior to moving the walker and the rest of patient's body as to  prevent internal rotation/ turning in of the leg. Abduction pillows are the most effective way to prevent a patient from not crossing legs or turning toes in at rest. If an abduction pillow is not ordered placing a regular pillow length wise between the patient's legs is also an effective reminder. It is imperative that these precautions be maintained so that the surgical hip does not dislocate. Follow-up:in 2 weeks Disposition - Home Discharged Condition: stable   Discharge Instructions    Call MD / Call 911    Complete by:  As directed    If you experience chest pain or shortness of breath, CALL 911 and be transported to the hospital emergency room.  If you develope a fever above 101 F, pus (white drainage) or increased drainage or redness at the wound, or calf pain, call your surgeon's office.   Constipation Prevention    Complete by:  As directed    Drink plenty of fluids.  Prune juice may be helpful.  You may use a stool softener, such as Colace (over the counter) 100 mg twice a day.  Use MiraLax (over the counter) for constipation as needed.   Diet - low sodium heart healthy    Complete by:  As directed    Discharge instructions    Complete by:  As directed    INSTRUCTIONS AFTER JOINT REPLACEMENT   Remove items at home which could result in a fall. This includes throw rugs or furniture in walking pathways ICE to the affected joint every three hours while awake for 30 minutes at a time, for at least the first 3-5 days, and then as needed for pain and swelling.  Continue to use ice for pain and swelling. You may notice swelling that will progress down to the foot and ankle.  This is normal after surgery.  Elevate your leg when you are not up walking on it.   Continue to use the breathing machine you got in the hospital (incentive spirometer) which will help keep your temperature down.  It is common for your temperature to cycle up and down following surgery, especially at night when you  are not up moving around and exerting yourself.  The breathing machine keeps your lungs expanded and your temperature down.   DIET:  As you were doing prior to hospitalization, we recommend a well-balanced diet.  DRESSING / WOUND CARE / SHOWERING  Keep  the surgical dressing until follow up.  The dressing is water proof, so you can shower without any extra covering.  IF THE DRESSING FALLS OFF or the wound gets wet inside, change the dressing with sterile gauze.  Please use good hand washing techniques before changing the dressing.  Do not use any lotions or creams on the incision until instructed by your surgeon.    ACTIVITY  Increase activity slowly as tolerated, but follow the weight bearing instructions below.   No driving for 6 weeks or until further direction given by your physician.  You cannot drive while taking narcotics.  No lifting or carrying greater than 10 lbs. until further directed by your surgeon. Avoid periods of inactivity such as sitting longer than an hour when not asleep. This helps prevent blood clots.  You may return to work once you are authorized by your doctor.   HIP PRECAUTIONS No bending hip over 90 degrees- A "L" Angle Do not cross legs Do not let foot roll inward When turning these patients a pillow should be placed between the patient's legs to prevent crossing. Patients should have the affected knee fully extended when trying to sit or stand from all surfaces to prevent excessive hip flexion. When ambulating and turning toward the affected side the affected leg should have the toes turned out prior to moving the walker and the rest of patient's body as to prevent internal rotation/ turning in of the leg. Abduction pillows are the most effective way to prevent a patient from not crossing legs or turning toes in at rest. If an abduction pillow is not ordered placing a regular pillow length wise between the patient's legs is also an effective reminder. It is  imperative that these precautions be maintained so that the surgical hip does not dislocate.  WEIGHT BEARING   Weight bearing as tolerated with assist device (walker, cane, etc) as directed, use it as long as suggested by your surgeon or therapist, typically at least 4-6 weeks.   EXERCISES  Results after joint replacement surgery are often greatly improved when you follow the exercise, range of motion and muscle strengthening exercises prescribed by your doctor. Safety measures are also important to protect the joint from further injury. Any time any of these exercises cause you to have increased pain or swelling, decrease what you are doing until you are comfortable again and then slowly increase them. If you have problems or questions, call your caregiver or physical therapist for advice.   Rehabilitation is important following a joint replacement. After just a few days of immobilization, the muscles of the leg can become weakened and shrink (atrophy).  These exercises are designed to build up the tone and strength of the thigh and leg muscles and to improve motion. Often times heat used for twenty to thirty minutes before working out will loosen up your tissues and help with improving the range of motion but do not use heat for the first two weeks following surgery (sometimes heat can increase post-operative swelling).   These exercises can be done on a training (exercise) mat, on the floor, on a table or on a bed. Use whatever works the best and is most comfortable for you.    Use music or television while you are exercising so that the exercises are a pleasant break in your day. This will make your life better with the exercises acting as a break in your routine that you can look forward to.   Perform all  exercises about fifteen times, three times per day or as directed.  You should exercise both the operative leg and the other leg as well.  Exercises include:   Quad Sets - Tighten up the  muscle on the front of the thigh (Quad) and hold for 5-10 seconds.   Straight Leg Raises - With your knee straight (if you were given a brace, keep it on), lift the leg to 60 degrees, hold for 3 seconds, and slowly lower the leg.  Perform this exercise against resistance later as your leg gets stronger.  Leg Slides: Lying on your back, slowly slide your foot toward your buttocks, bending your knee up off the floor (only go as far as is comfortable). Then slowly slide your foot back down until your leg is flat on the floor again.  Angel Wings: Lying on your back spread your legs to the side as far apart as you can without causing discomfort.  Hamstring Strength:  Lying on your back, push your heel against the floor with your leg straight by tightening up the muscles of your buttocks.  Repeat, but this time bend your knee to a comfortable angle, and push your heel against the floor.  You may put a pillow under the heel to make it more comfortable if necessary.   A rehabilitation program following joint replacement surgery can speed recovery and prevent re-injury in the future due to weakened muscles. Contact your doctor or a physical therapist for more information on knee rehabilitation.    CONSTIPATION  Constipation is defined medically as fewer than three stools per week and severe constipation as less than one stool per week.  Even if you have a regular bowel pattern at home, your normal regimen is likely to be disrupted due to multiple reasons following surgery.  Combination of anesthesia, postoperative narcotics, change in appetite and fluid intake all can affect your bowels.   YOU MUST use at least one of the following options; they are listed in order of increasing strength to get the job done.  They are all available over the counter, and you may need to use some, POSSIBLY even all of these options:    Drink plenty of fluids (prune juice may be helpful) and high fiber foods Colace 100 mg by  mouth twice a day  Senokot for constipation as directed and as needed Dulcolax (bisacodyl), take with full glass of water  Miralax (polyethylene glycol) once or twice a day as needed.  If you have tried all these things and are unable to have a bowel movement in the first 3-4 days after surgery call either your surgeon or your primary doctor.    If you experience loose stools or diarrhea, hold the medications until you stool forms back up.  If your symptoms do not get better within 1 week or if they get worse, check with your doctor.  If you experience "the worst abdominal pain ever" or develop nausea or vomiting, please contact the office immediately for further recommendations for treatment.   ITCHING:  If you experience itching with your medications, try taking only a single pain pill, or even half a pain pill at a time.  You can also use Benadryl over the counter for itching or also to help with sleep.   TED HOSE STOCKINGS:  Use stockings on both legs until for at least 2 weeks or as directed by physician office. They may be removed at night for sleeping.  MEDICATIONS:  See your medication  summary on the "After Visit Summary" that nursing will review with you.  You may have some home medications which will be placed on hold until you complete the course of blood thinner medication.  It is important for you to complete the blood thinner medication as prescribed.  PRECAUTIONS:  If you experience chest pain or shortness of breath - call 911 immediately for transfer to the hospital emergency department.   If you develop a fever greater that 101 F, purulent drainage from wound, increased redness or drainage from wound, foul odor from the wound/dressing, or calf pain - CONTACT YOUR SURGEON.                                                   FOLLOW-UP APPOINTMENTS:  If you do not already have a post-op appointment, please call the office for an appointment to be seen by your surgeon.  Guidelines for  how soon to be seen are listed in your "After Visit Summary", but are typically between 1-4 weeks after surgery.   MAKE SURE YOU:  Understand these instructions.  Get help right away if you are not doing well or get worse.    Thank you for letting us be a part of your medical care team.  It is a privilege we respect greatly.  We hope these instructions will help you stay on track for a fast and full recovery!   Increase activity slowly as tolerated    Complete by:  As directed        Medication List    STOP taking these medications   amoxicillin 500 MG tablet Commonly known as:  AMOXIL   aspirin EC 81 MG tablet     TAKE these medications   acetaminophen 500 MG tablet Commonly known as:  TYLENOL Take 1-2 tablets (500-1,000 mg total) by mouth every 8 (eight) hours as needed for mild pain or moderate pain.   brimonidine 0.2 % ophthalmic solution Commonly known as:  ALPHAGAN Place 1 drop into both eyes 2 (two) times daily.   Cranberry 400 MG Caps Take 400 mg by mouth at bedtime.   ELIQUIS 5 MG Tabs tablet Generic drug:  apixaban Take 5 mg by mouth 2 (two) times daily.   GLUCOSAMINE CHONDROITIN JOINT PO Take 1 tablet by mouth 3 (three) times daily with meals.   latanoprost 0.005 % ophthalmic solution Commonly known as:  XALATAN Place 1 drop into both eyes at bedtime.   lisinopril 5 MG tablet Commonly known as:  PRINIVIL,ZESTRIL Take 1 tablet (5 mg total) by mouth daily. What changed:  when to take this   methocarbamol 500 MG tablet Commonly known as:  ROBAXIN Take 1 tablet (500 mg total) by mouth every 6 (six) hours as needed for muscle spasms.   MSM 1000 MG Caps Take 8,000-12,000 mg by mouth 4 (four) times daily.   OMEGA-3 FISH OIL PO Take 1 capsule by mouth 2 (two) times daily.   OVER THE COUNTER MEDICATION Take 400 mg by mouth daily. D-Mannose 423m   OVER THE COUNTER MEDICATION Take 1 capsule by mouth daily with lunch. AgilEase   PROBIOTIC DAILY  PO Take 1 tablet by mouth daily.   SAW PALMETTO-PUMPKIN SEED OIL PO Take 1 tablet by mouth daily.   timolol 0.5 % ophthalmic solution Commonly known as:  BETIMOL Place 1 drop  into both eyes every morning.   traMADol 50 MG tablet Commonly known as:  ULTRAM Take 1-2 tablets (50-100 mg total) by mouth every 6 (six) hours as needed for moderate pain or severe pain.   TURMERIC CURCUMIN PO Take 1 capsule by mouth daily with lunch.   vitamin C 500 MG tablet Commonly known as:  ASCORBIC ACID Take 500 mg by mouth 2 (two) times daily.      Follow-up [facility] .   Why:  rolling walker Contact information: [place] [address] [phone]        [facility] .   Why:  now known as [facility].  This home health agency will call you to schedule your at home health physical therapy. Contact information: [address] [place] [address] [phone]        Tobi Bastos, MD. Schedule an appointment as soon as possible for a visit in 2 week(s).   Specialty:  Orthopedic Surgery Contact information: [address] [place] [address] [phone]           Signed: Ardeen Jourdain, PA-C Orthopaedic Surgery 10/14/2015, 10:51 AM

## 2015-10-17 ENCOUNTER — Ambulatory Visit: Payer: Medicare Other | Admitting: Cardiovascular Disease

## 2015-10-17 DIAGNOSIS — Z471 Aftercare following joint replacement surgery: Secondary | ICD-10-CM | POA: Diagnosis not present

## 2015-10-17 DIAGNOSIS — I493 Ventricular premature depolarization: Secondary | ICD-10-CM | POA: Diagnosis not present

## 2015-10-17 DIAGNOSIS — H409 Unspecified glaucoma: Secondary | ICD-10-CM | POA: Diagnosis not present

## 2015-10-17 DIAGNOSIS — I509 Heart failure, unspecified: Secondary | ICD-10-CM | POA: Diagnosis not present

## 2015-10-17 DIAGNOSIS — I2510 Atherosclerotic heart disease of native coronary artery without angina pectoris: Secondary | ICD-10-CM | POA: Diagnosis not present

## 2015-10-17 DIAGNOSIS — Z954 Presence of other heart-valve replacement: Secondary | ICD-10-CM | POA: Diagnosis not present

## 2015-10-17 DIAGNOSIS — M1711 Unilateral primary osteoarthritis, right knee: Secondary | ICD-10-CM | POA: Diagnosis not present

## 2015-10-17 DIAGNOSIS — I491 Atrial premature depolarization: Secondary | ICD-10-CM | POA: Diagnosis not present

## 2015-10-17 DIAGNOSIS — Z951 Presence of aortocoronary bypass graft: Secondary | ICD-10-CM | POA: Diagnosis not present

## 2015-10-17 DIAGNOSIS — E7800 Pure hypercholesterolemia, unspecified: Secondary | ICD-10-CM | POA: Diagnosis not present

## 2015-10-17 DIAGNOSIS — M1611 Unilateral primary osteoarthritis, right hip: Secondary | ICD-10-CM | POA: Diagnosis not present

## 2015-10-17 DIAGNOSIS — Z7901 Long term (current) use of anticoagulants: Secondary | ICD-10-CM | POA: Diagnosis not present

## 2015-10-17 DIAGNOSIS — I110 Hypertensive heart disease with heart failure: Secondary | ICD-10-CM | POA: Diagnosis not present

## 2015-10-17 DIAGNOSIS — Z96641 Presence of right artificial hip joint: Secondary | ICD-10-CM | POA: Diagnosis not present

## 2015-10-21 DIAGNOSIS — Z951 Presence of aortocoronary bypass graft: Secondary | ICD-10-CM | POA: Diagnosis not present

## 2015-10-21 DIAGNOSIS — E7800 Pure hypercholesterolemia, unspecified: Secondary | ICD-10-CM | POA: Diagnosis not present

## 2015-10-21 DIAGNOSIS — I493 Ventricular premature depolarization: Secondary | ICD-10-CM | POA: Diagnosis not present

## 2015-10-21 DIAGNOSIS — I110 Hypertensive heart disease with heart failure: Secondary | ICD-10-CM | POA: Diagnosis not present

## 2015-10-21 DIAGNOSIS — Z471 Aftercare following joint replacement surgery: Secondary | ICD-10-CM | POA: Diagnosis not present

## 2015-10-21 DIAGNOSIS — I509 Heart failure, unspecified: Secondary | ICD-10-CM | POA: Diagnosis not present

## 2015-10-21 DIAGNOSIS — I2510 Atherosclerotic heart disease of native coronary artery without angina pectoris: Secondary | ICD-10-CM | POA: Diagnosis not present

## 2015-10-21 DIAGNOSIS — H409 Unspecified glaucoma: Secondary | ICD-10-CM | POA: Diagnosis not present

## 2015-10-21 DIAGNOSIS — Z96641 Presence of right artificial hip joint: Secondary | ICD-10-CM | POA: Diagnosis not present

## 2015-10-21 DIAGNOSIS — Z954 Presence of other heart-valve replacement: Secondary | ICD-10-CM | POA: Diagnosis not present

## 2015-10-21 DIAGNOSIS — I491 Atrial premature depolarization: Secondary | ICD-10-CM | POA: Diagnosis not present

## 2015-10-21 DIAGNOSIS — Z7901 Long term (current) use of anticoagulants: Secondary | ICD-10-CM | POA: Diagnosis not present

## 2015-10-23 DIAGNOSIS — I491 Atrial premature depolarization: Secondary | ICD-10-CM | POA: Diagnosis not present

## 2015-10-23 DIAGNOSIS — I509 Heart failure, unspecified: Secondary | ICD-10-CM | POA: Diagnosis not present

## 2015-10-23 DIAGNOSIS — Z96641 Presence of right artificial hip joint: Secondary | ICD-10-CM | POA: Diagnosis not present

## 2015-10-23 DIAGNOSIS — I493 Ventricular premature depolarization: Secondary | ICD-10-CM | POA: Diagnosis not present

## 2015-10-23 DIAGNOSIS — I110 Hypertensive heart disease with heart failure: Secondary | ICD-10-CM | POA: Diagnosis not present

## 2015-10-23 DIAGNOSIS — Z951 Presence of aortocoronary bypass graft: Secondary | ICD-10-CM | POA: Diagnosis not present

## 2015-10-23 DIAGNOSIS — I2510 Atherosclerotic heart disease of native coronary artery without angina pectoris: Secondary | ICD-10-CM | POA: Diagnosis not present

## 2015-10-23 DIAGNOSIS — Z471 Aftercare following joint replacement surgery: Secondary | ICD-10-CM | POA: Diagnosis not present

## 2015-10-23 DIAGNOSIS — Z954 Presence of other heart-valve replacement: Secondary | ICD-10-CM | POA: Diagnosis not present

## 2015-10-23 DIAGNOSIS — Z7901 Long term (current) use of anticoagulants: Secondary | ICD-10-CM | POA: Diagnosis not present

## 2015-10-23 DIAGNOSIS — E7800 Pure hypercholesterolemia, unspecified: Secondary | ICD-10-CM | POA: Diagnosis not present

## 2015-10-23 DIAGNOSIS — H409 Unspecified glaucoma: Secondary | ICD-10-CM | POA: Diagnosis not present

## 2015-10-24 DIAGNOSIS — I2510 Atherosclerotic heart disease of native coronary artery without angina pectoris: Secondary | ICD-10-CM | POA: Diagnosis not present

## 2015-10-24 DIAGNOSIS — Z954 Presence of other heart-valve replacement: Secondary | ICD-10-CM | POA: Diagnosis not present

## 2015-10-24 DIAGNOSIS — Z951 Presence of aortocoronary bypass graft: Secondary | ICD-10-CM | POA: Diagnosis not present

## 2015-10-24 DIAGNOSIS — I493 Ventricular premature depolarization: Secondary | ICD-10-CM | POA: Diagnosis not present

## 2015-10-24 DIAGNOSIS — I110 Hypertensive heart disease with heart failure: Secondary | ICD-10-CM | POA: Diagnosis not present

## 2015-10-24 DIAGNOSIS — H409 Unspecified glaucoma: Secondary | ICD-10-CM | POA: Diagnosis not present

## 2015-10-24 DIAGNOSIS — I509 Heart failure, unspecified: Secondary | ICD-10-CM | POA: Diagnosis not present

## 2015-10-24 DIAGNOSIS — Z471 Aftercare following joint replacement surgery: Secondary | ICD-10-CM | POA: Diagnosis not present

## 2015-10-24 DIAGNOSIS — E7800 Pure hypercholesterolemia, unspecified: Secondary | ICD-10-CM | POA: Diagnosis not present

## 2015-10-24 DIAGNOSIS — Z7901 Long term (current) use of anticoagulants: Secondary | ICD-10-CM | POA: Diagnosis not present

## 2015-10-24 DIAGNOSIS — Z96641 Presence of right artificial hip joint: Secondary | ICD-10-CM | POA: Diagnosis not present

## 2015-10-24 DIAGNOSIS — I491 Atrial premature depolarization: Secondary | ICD-10-CM | POA: Diagnosis not present

## 2015-10-28 DIAGNOSIS — Z954 Presence of other heart-valve replacement: Secondary | ICD-10-CM | POA: Diagnosis not present

## 2015-10-28 DIAGNOSIS — Z951 Presence of aortocoronary bypass graft: Secondary | ICD-10-CM | POA: Diagnosis not present

## 2015-10-28 DIAGNOSIS — I493 Ventricular premature depolarization: Secondary | ICD-10-CM | POA: Diagnosis not present

## 2015-10-28 DIAGNOSIS — R269 Unspecified abnormalities of gait and mobility: Secondary | ICD-10-CM | POA: Diagnosis not present

## 2015-10-28 DIAGNOSIS — Z96641 Presence of right artificial hip joint: Secondary | ICD-10-CM | POA: Diagnosis not present

## 2015-10-28 DIAGNOSIS — E7800 Pure hypercholesterolemia, unspecified: Secondary | ICD-10-CM | POA: Diagnosis not present

## 2015-10-28 DIAGNOSIS — I509 Heart failure, unspecified: Secondary | ICD-10-CM | POA: Diagnosis not present

## 2015-10-28 DIAGNOSIS — Z471 Aftercare following joint replacement surgery: Secondary | ICD-10-CM | POA: Diagnosis not present

## 2015-10-28 DIAGNOSIS — H409 Unspecified glaucoma: Secondary | ICD-10-CM | POA: Diagnosis not present

## 2015-10-28 DIAGNOSIS — I491 Atrial premature depolarization: Secondary | ICD-10-CM | POA: Diagnosis not present

## 2015-10-28 DIAGNOSIS — I2510 Atherosclerotic heart disease of native coronary artery without angina pectoris: Secondary | ICD-10-CM | POA: Diagnosis not present

## 2015-10-28 DIAGNOSIS — I110 Hypertensive heart disease with heart failure: Secondary | ICD-10-CM | POA: Diagnosis not present

## 2015-10-28 DIAGNOSIS — Z7901 Long term (current) use of anticoagulants: Secondary | ICD-10-CM | POA: Diagnosis not present

## 2015-10-28 DIAGNOSIS — Z952 Presence of prosthetic heart valve: Secondary | ICD-10-CM | POA: Diagnosis not present

## 2015-10-30 DIAGNOSIS — Z96641 Presence of right artificial hip joint: Secondary | ICD-10-CM | POA: Diagnosis not present

## 2015-10-30 DIAGNOSIS — H409 Unspecified glaucoma: Secondary | ICD-10-CM | POA: Diagnosis not present

## 2015-10-30 DIAGNOSIS — Z7901 Long term (current) use of anticoagulants: Secondary | ICD-10-CM | POA: Diagnosis not present

## 2015-10-30 DIAGNOSIS — I493 Ventricular premature depolarization: Secondary | ICD-10-CM | POA: Diagnosis not present

## 2015-10-30 DIAGNOSIS — I509 Heart failure, unspecified: Secondary | ICD-10-CM | POA: Diagnosis not present

## 2015-10-30 DIAGNOSIS — E7800 Pure hypercholesterolemia, unspecified: Secondary | ICD-10-CM | POA: Diagnosis not present

## 2015-10-30 DIAGNOSIS — I2510 Atherosclerotic heart disease of native coronary artery without angina pectoris: Secondary | ICD-10-CM | POA: Diagnosis not present

## 2015-10-30 DIAGNOSIS — I491 Atrial premature depolarization: Secondary | ICD-10-CM | POA: Diagnosis not present

## 2015-10-30 DIAGNOSIS — Z471 Aftercare following joint replacement surgery: Secondary | ICD-10-CM | POA: Diagnosis not present

## 2015-10-30 DIAGNOSIS — Z954 Presence of other heart-valve replacement: Secondary | ICD-10-CM | POA: Diagnosis not present

## 2015-10-30 DIAGNOSIS — Z951 Presence of aortocoronary bypass graft: Secondary | ICD-10-CM | POA: Diagnosis not present

## 2015-10-30 DIAGNOSIS — I110 Hypertensive heart disease with heart failure: Secondary | ICD-10-CM | POA: Diagnosis not present

## 2015-10-31 DIAGNOSIS — E7800 Pure hypercholesterolemia, unspecified: Secondary | ICD-10-CM | POA: Diagnosis not present

## 2015-10-31 DIAGNOSIS — Z471 Aftercare following joint replacement surgery: Secondary | ICD-10-CM | POA: Diagnosis not present

## 2015-10-31 DIAGNOSIS — H409 Unspecified glaucoma: Secondary | ICD-10-CM | POA: Diagnosis not present

## 2015-10-31 DIAGNOSIS — I110 Hypertensive heart disease with heart failure: Secondary | ICD-10-CM | POA: Diagnosis not present

## 2015-10-31 DIAGNOSIS — Z951 Presence of aortocoronary bypass graft: Secondary | ICD-10-CM | POA: Diagnosis not present

## 2015-10-31 DIAGNOSIS — I493 Ventricular premature depolarization: Secondary | ICD-10-CM | POA: Diagnosis not present

## 2015-10-31 DIAGNOSIS — Z954 Presence of other heart-valve replacement: Secondary | ICD-10-CM | POA: Diagnosis not present

## 2015-10-31 DIAGNOSIS — Z96641 Presence of right artificial hip joint: Secondary | ICD-10-CM | POA: Diagnosis not present

## 2015-10-31 DIAGNOSIS — I491 Atrial premature depolarization: Secondary | ICD-10-CM | POA: Diagnosis not present

## 2015-10-31 DIAGNOSIS — I509 Heart failure, unspecified: Secondary | ICD-10-CM | POA: Diagnosis not present

## 2015-10-31 DIAGNOSIS — Z7901 Long term (current) use of anticoagulants: Secondary | ICD-10-CM | POA: Diagnosis not present

## 2015-10-31 DIAGNOSIS — I2510 Atherosclerotic heart disease of native coronary artery without angina pectoris: Secondary | ICD-10-CM | POA: Diagnosis not present

## 2015-11-01 ENCOUNTER — Ambulatory Visit (INDEPENDENT_AMBULATORY_CARE_PROVIDER_SITE_OTHER): Payer: Medicare Other | Admitting: Cardiovascular Disease

## 2015-11-01 ENCOUNTER — Encounter: Payer: Self-pay | Admitting: Cardiovascular Disease

## 2015-11-01 VITALS — BP 96/70 | HR 85 | Ht 71.0 in | Wt 172.0 lb

## 2015-11-01 DIAGNOSIS — I493 Ventricular premature depolarization: Secondary | ICD-10-CM

## 2015-11-01 DIAGNOSIS — I491 Atrial premature depolarization: Secondary | ICD-10-CM | POA: Diagnosis not present

## 2015-11-01 MED ORDER — APIXABAN 5 MG PO TABS: 5.0000 mg | ORAL_TABLET | Freq: Two times a day (BID) | ORAL | 6 refills | Status: DC

## 2015-11-01 NOTE — Progress Notes (Signed)
Cardiology Office Note  Date:  11/01/2015   ID:  BRYLON DISABATINO, DOB [birth year], MRN [id number]  PCP:  [name] Sake, MD   Chief Complaint  Patient presents with  . other    Hip surgery sched for 9/27.  Needs cardiac clearance.  Hip replacement    HPI:  [name] [name] is a pleasant 75 year old gentleman with a history of coronary artery disease, bypass surgery December 2015 with bioprosthetic aortic valve who presents for routine follow-up of his coronary disease, aortic valve prosthesis and for preoperative evaluation    prior AVR 2005, redo December 2015 CABG, AVR history of hyperlipidemia, declined cholesterol medication despite diagnosis of coronary disease Most recent ejection fraction March 2016 was greater than 60%  Presents today with his daughter who does most of the talking for him   atrial fibrillation on routine EKG in preop clinicbefore hip surgery Asymptomatic Hip surgery September 27 Procedure went well, still having pain now in the contralateral hip Alert but relatively nonverbal on today's visit, family is seems this is from the pain medication and muscle relaxer pills  Started on anticoagulation on his last clinic visit, he is tolerating this well   no complaints of anginal pain  EKG on today's visit shows atrial fibrillation with ventricular rate 85 bpm, PVCs  Other past medical history Carotid ultrasound December 2015 less than 39% bilaterally CT scan of the chest from May 2016 pulled up in the office showing diffuse heavy LAD and circumflex one area atherosclerosis, minimal in the aorta  Other past medical history October 2015 when he began to experience symptoms of increased dyspnea and orthopnea.   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 His left ventricle was dilated and his ejection fraction had fallen to 30-35%.    cardiac catheterization and then  underwent a redo aortic valve replacement with a pericardial tissue valve as well as two-vessel coronary artery bypass graft surgery by Dr. Servando Snare on 12/27/13. The patient had postoperative atrial fibrillation.  He was discharged home on amiodarone   PMH:   has a past medical history of Aortic stenosis; Arthritis; CHF (congestive heart failure) ([place]); Coronary artery disease; Glaucoma; Heart murmur; Hyperlipidemia; and Hypertension.  PSH:    Past Surgical History:  Procedure Laterality Date  . AORTIC VALVE REPLACEMENT N/A 12/27/2013   Procedure: REDO AORTIC VALVE REPLACEMENT WITH 23MM MAGNA EASE PERICARDIAL TISSUE VALVE;  Surgeon: [name] Isaac, MD;  Location: [place];  Service: Open Heart Surgery;  Laterality: N/A;  . CARDIAC CATHETERIZATION  02/08/2003   severe aortic stenosis,normal left ventricular function  . CORONARY ARTERY BYPASS GRAFT N/A 12/27/2013   Procedure: CORONARY ARTERY BYPASS GRAFTING TIMES TWO USING RIGHT LEG SAPHENOUS VEIN TO RIGHT CORONARY ARTERY AND LEFT INTERNAL MAMMARY ARTERY TO LEFT ANTERIOR DESCENDING ARTERY;  Surgeon: [name] Isaac, MD;  Location: [place];  Service: Open Heart Surgery;  Laterality: N/A;  . HERNIA REPAIR    . INGUINAL HERNIA REPAIR  04/2003  . INGUINAL HERNIA REPAIR Left 12/18/2014   Procedure: OPEN LEFT INGUINAL HERNIA REPAIR;  Surgeon: [name] Seltzer, MD;  [facility];  Service: General;  Laterality: Left;  . INSERTION OF MESH Left 12/18/2014   Procedure: INSERTION OF MESH;  Surgeon: [name] Seltzer, MD;  [facility];  Service: General;  Laterality: Left;  . LEFT AND RIGHT HEART CATHETERIZATION WITH CORONARY ANGIOGRAM N/A 12/18/2013   Procedure: LEFT AND RIGHT HEART CATHETERIZATION WITH CORONARY ANGIOGRAM;  Surgeon: [name] M Martinique, MD;  Location: [facility] CATH LAB;  Service: Cardiovascular;  Laterality: N/A;  . TEE WITHOUT CARDIOVERSION N/A 12/27/2013   Procedure: TRANSESOPHAGEAL ECHOCARDIOGRAM (TEE);  Surgeon: [name] Isaac, MD;   Location: [place];  Service: Open Heart Surgery;  Laterality: N/A;  . TISSUE AORTIC VALVE REPLACEMENT  02/09/2003   # 25 pericardial tissue valve  . TOTAL HIP ARTHROPLASTY Right 10/09/2015   Procedure: RIGHT TOTAL HIP ARTHROPLASTY;  Surgeon: [name] Maudlin, MD;  [facility];  Service: Orthopedics;  Laterality: Right;    Current Outpatient Prescriptions  Medication Sig Dispense Refill  . acetaminophen (TYLENOL) 500 MG tablet Take 1-2 tablets (500-1,000 mg total) by mouth every 8 (eight) hours as needed for mild pain or moderate pain. 30 tablet 0  . apixaban (ELIQUIS) 5 MG TABS tablet Take 5 mg by mouth 2 (two) times daily.    . brimonidine (ALPHAGAN) 0.2 % ophthalmic solution Place 1 drop into both eyes 2 (two) times daily.    . Cranberry 400 MG CAPS Take 400 mg by mouth at bedtime.    . Glucos-Chondroit-Hyaluron-MSM (GLUCOSAMINE CHONDROITIN JOINT PO) Take 1 tablet by mouth 3 (three) times daily with meals.    . latanoprost (XALATAN) 0.005 % ophthalmic solution Place 1 drop into both eyes at bedtime.     Marland Kitchen lisinopril (PRINIVIL,ZESTRIL) 5 MG tablet Take 1 tablet (5 mg total) by mouth daily. (Patient taking differently: Take 5 mg by mouth daily after supper. ) 90 tablet 3  . methocarbamol (ROBAXIN) 500 MG tablet Take 1 tablet (500 mg total) by mouth every 6 (six) hours as needed for muscle spasms. 40 tablet 1  . Methylsulfonylmethane (MSM) 1000 MG CAPS Take 8,000-12,000 mg by mouth 4 (four) times daily.     . Omega-3 Fatty Acids (OMEGA-3 FISH OIL PO) Take 1 capsule by mouth 2 (two) times daily.    Marland Kitchen OVER THE COUNTER MEDICATION Take 400 mg by mouth daily. D-Mannose 400mg     . OVER THE COUNTER MEDICATION Take 1 capsule by mouth daily with lunch. AgilEase    . Probiotic Product (PROBIOTIC DAILY PO) Take 1 tablet by mouth daily.    . SAW PALMETTO-PUMPKIN SEED OIL PO Take 1 tablet by mouth daily.    . timolol (BETIMOL) 0.5 % ophthalmic solution Place 1 drop into both eyes every morning.    .  traMADol (ULTRAM) 50 MG tablet Take 1-2 tablets (50-100 mg total) by mouth every 6 (six) hours as needed for moderate pain or severe pain. 60 tablet 0  . TURMERIC CURCUMIN PO Take 1 capsule by mouth daily with lunch.    . vitamin C (ASCORBIC ACID) 500 MG tablet Take 500 mg by mouth 2 (two) times daily.     No current facility-administered medications for this visit.      Allergies:   Oxycodone   Social History:  The patient  reports that he has never smoked. He has never used smokeless tobacco. He reports that he does not drink alcohol or use drugs.   Family History:   family history includes Cancer in his father; Heart disease in his mother.    Review of Systems: Review of Systems  Constitutional: Negative.   Respiratory: Negative.   Cardiovascular: Negative.   Gastrointestinal: Negative.   Musculoskeletal: Positive for joint pain.  Neurological: Negative.   Psychiatric/Behavioral: Negative.   All other systems reviewed and are negative.    PHYSICAL EXAM: Vitals:   11/01/15 1513  BP: 96/70  Pulse: 85  weight 172 pounds, 511 GEN: Well nourished, well developed, in no acute distress , presenting in a wheelchair HEENT: normal  Neck: no JVD, carotid bruits, or masses Cardiac: irregularly irregular, 2+ SEM RSB, no murmurs, rubs, or gallops,no edema  Respiratory:  clear to auscultation bilaterally, normal work of breathing GI: soft, nontender, nondistended, + BS MS: no deformity or atrophy  Skin: warm and dry, no rash Neuro:  Strength and sensation are intact Psych: euthymic mood, full affect    Recent Labs: 11/23/2014: Magnesium 1.9 10/03/2015: ALT 21 10/11/2015: BUN 17; Creatinine, Ser 0.75; Potassium 3.9; Sodium 138 10/12/2015: Hemoglobin 10.5; Platelets 134    Lipid Panel Lab Results  Component Value Date   CHOL 187 07/13/2014   HDL 61.30 07/13/2014   LDLCALC 117 (H) 07/13/2014   TRIG 44.0 07/13/2014      Wt Readings from Last 3 Encounters:  11/01/15 172  lb (78 kg)  10/09/15 172 lb (78 kg)  10/04/15 172 lb 8 oz (78.2 kg)       ASSESSMENT AND PLAN:  S/P CABG x 2 Currently with no symptoms of angina. No further workup at this time. Continue current medication regimen.  S/P AVR Placed in 2015, Functioning well on recent echocardiogram  Persistent atrial fibrillation ([place]) Diagnosed in 2017, asymptomatic Long discussion concerning atrial fibrillation, risk and benefit of being on anticoagulation Recommended that he stay on eliquis 5 mg twice a day No rate controlling medications needed, current rate is in the 70s As he is asymptomatic, no immediate plans to restore normal sinus rhythm Would need to stay on anticoagulation even if normal sinus rhythm is restored given elevated CHADS VASC  Primary osteoarthritis of right hip Recovering from his recent right hip surgery  Systolic hypertension Blood pressure low, recommended they hold lisinopril until blood pressure improves. Likely secondary to pain medication  Hypercholesterolemia he has declined cholesterol medication    Total encounter time more than 35 minutes  Greater than 50% was spent in counseling and coordination of care with the patient   Disposition:   F/U  6 months   Signed, Esmond Plants, M.D., Ph.D. 11/01/2015  [place], [place]

## 2015-11-01 NOTE — Patient Instructions (Signed)
Medication Instructions:   No medication changes made  Hold lisinopril for blood pressure <120, Push the fluids  Labwork:  No new labs needed  Testing/Procedures:  No further testing at this time   Follow-Up: It was a pleasure seeing you in the office today. Please call us if you have new issues that need to be addressed before your next appt.  [phone]  Your physician wants you to follow-up in: 6 months.  You will receive a reminder letter in the mail two months in advance. If you don't receive a letter, please call our office to schedule the follow-up appointment.  If you need a refill on your cardiac medications before your next appointment, please call your pharmacy.

## 2015-11-07 ENCOUNTER — Ambulatory Visit: Payer: Medicare Other | Admitting: Cardiovascular Disease

## 2015-11-07 DIAGNOSIS — Z471 Aftercare following joint replacement surgery: Secondary | ICD-10-CM | POA: Diagnosis not present

## 2015-11-07 DIAGNOSIS — Z96641 Presence of right artificial hip joint: Secondary | ICD-10-CM | POA: Diagnosis not present

## 2015-11-07 DIAGNOSIS — M1611 Unilateral primary osteoarthritis, right hip: Secondary | ICD-10-CM | POA: Diagnosis not present

## 2015-11-11 ENCOUNTER — Telehealth: Payer: Self-pay | Admitting: *Deleted

## 2015-11-11 NOTE — Telephone Encounter (Signed)
Pt has been approved for Eliquis 5 mg tablet.  PA case : [id number]

## 2015-12-09 ENCOUNTER — Telehealth: Payer: Self-pay | Admitting: Cardiovascular Disease

## 2015-12-09 NOTE — Telephone Encounter (Signed)
Spoke w/ Lattie Haw.  Advised her of Dr. Donivan Scull recommendation.  She will speak w/ pt and his PCP, as pt's mother is on coumadin, as well.  She feels that pt will have difficulty adhering to low K+ diet. She request samples of Eliquis until they can decide on whether to switch to coumadin. They area also preparing to switch ins co and unsure if this will make Eliquis more affordable.  Left 4 boxes of Eliquis samples and coupon for free 30 days at the front desk for pt to p/u at their convenience.  Asked her to call back if pt would like to change meds.  She is appreciative of the call.

## 2015-12-09 NOTE — Telephone Encounter (Signed)
Pt daughter called, states that pt Eliquis is too expensive for pt . Would like to know an alternative, or recommendation. Please call.

## 2015-12-09 NOTE — Telephone Encounter (Signed)
Eliquis was approved through pt's ins, but it is still not affordable. Is coumadin an option? He lives in [place], so frequent visits may be a problem.

## 2015-12-09 NOTE — Telephone Encounter (Signed)
Would recommend he call primary cares office See if they are quick to monitor his Coumadin level Would save him frequent visits into town

## 2016-05-26 DIAGNOSIS — M25561 Pain in right knee: Secondary | ICD-10-CM | POA: Diagnosis not present

## 2016-05-26 DIAGNOSIS — Z96641 Presence of right artificial hip joint: Secondary | ICD-10-CM | POA: Diagnosis not present

## 2016-06-30 ENCOUNTER — Ambulatory Visit (INDEPENDENT_AMBULATORY_CARE_PROVIDER_SITE_OTHER): Payer: PPO | Admitting: Cardiovascular Disease

## 2016-06-30 ENCOUNTER — Encounter: Payer: Self-pay | Admitting: Cardiovascular Disease

## 2016-06-30 VITALS — BP 112/82 | Ht 72.0 in | Wt 174.2 lb

## 2016-06-30 DIAGNOSIS — E7800 Pure hypercholesterolemia, unspecified: Secondary | ICD-10-CM

## 2016-06-30 DIAGNOSIS — I493 Ventricular premature depolarization: Secondary | ICD-10-CM

## 2016-06-30 DIAGNOSIS — I491 Atrial premature depolarization: Secondary | ICD-10-CM | POA: Diagnosis not present

## 2016-06-30 DIAGNOSIS — Z951 Presence of aortocoronary bypass graft: Secondary | ICD-10-CM

## 2016-06-30 DIAGNOSIS — I519 Heart disease, unspecified: Secondary | ICD-10-CM | POA: Diagnosis not present

## 2016-06-30 DIAGNOSIS — I350 Nonrheumatic aortic (valve) stenosis: Secondary | ICD-10-CM | POA: Diagnosis not present

## 2016-06-30 DIAGNOSIS — Z952 Presence of prosthetic heart valve: Secondary | ICD-10-CM | POA: Diagnosis not present

## 2016-06-30 DIAGNOSIS — I482 Chronic atrial fibrillation, unspecified: Secondary | ICD-10-CM

## 2016-06-30 DIAGNOSIS — I10 Essential (primary) hypertension: Secondary | ICD-10-CM

## 2016-06-30 NOTE — Progress Notes (Signed)
Cardiology Office Note  Date:  06/30/2016   ID:  [name], DOB [birth year], MRN [id number]  PCP:  Leonides Sake, MD   Chief Complaint  Patient presents with  . other    Hip surgery sched for 9/27.  Needs cardiac clearance.  Hip replacement    HPI:  Mr. Barfield is a pleasant 76 year old gentleman with a history of  coronary artery disease,  bypass surgery December 2015 with bioprosthetic aortic valve  prior AVR 2005, redo December 2015 CABG, AVR hyperlipidemia, declined cholesterol medication Most recent ejection fraction March 2016 was greater than 60%  atrial fibrillation on routine EKG in preop clinic before hip surgery, Dx in 2017, asymptomatic who presents for routine follow-up of his coronary disease, aortic valve prosthesis and for preoperative evaluation   Presents today with his daughter  In general he reports he is doing well, denies any anginal symptoms Completed Hip surgery October 09, 2015 Started on anticoagulation, eliquis,  tolerating this well Works in his workshop, 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 His left ventricle was dilated and his ejection fraction had fallen to 30-35%.    cardiac catheterization and then underwent a redo aortic valve replacement with a pericardial tissue valve as well as two-vessel coronary artery bypass graft surgery by Dr. Servando Snare on 12/27/13. The patient had postoperative atrial  fibrillation.  He was discharged home on amiodarone   PMH:   has a past medical history of Aortic stenosis; Arthritis; CHF (congestive heart failure) ([place]); Coronary artery disease; Glaucoma; Heart murmur; Hyperlipidemia; and Hypertension.  PSH:    Past Surgical History:  Procedure Laterality Date  . AORTIC VALVE REPLACEMENT N/A 12/27/2013   Procedure: REDO AORTIC VALVE REPLACEMENT WITH 23MM MAGNA EASE PERICARDIAL TISSUE VALVE;  Surgeon: Grace Isaac, MD;  Location: [place];  Service: Open Heart Surgery;  Laterality: N/A;  . CARDIAC CATHETERIZATION  02/08/2003   severe aortic stenosis,normal left ventricular function  . CORONARY ARTERY BYPASS GRAFT N/A 12/27/2013   Procedure: CORONARY ARTERY BYPASS GRAFTING TIMES TWO USING RIGHT LEG SAPHENOUS VEIN TO RIGHT CORONARY ARTERY AND LEFT INTERNAL MAMMARY ARTERY TO LEFT ANTERIOR DESCENDING ARTERY;  Surgeon: Grace Isaac, MD;  Location: [place];  Service: Open Heart Surgery;  Laterality: N/A;  . HERNIA REPAIR    . INGUINAL HERNIA REPAIR  04/2003  . INGUINAL HERNIA REPAIR Left 12/18/2014   Procedure: OPEN LEFT INGUINAL HERNIA REPAIR;  Surgeon: Excell Seltzer, MD;  [facility];  Service: General;  Laterality: Left;  . INSERTION OF MESH Left 12/18/2014   Procedure: INSERTION OF MESH;  Surgeon: Excell Seltzer, MD;  [facility];  Service: General;  Laterality: Left;  . LEFT AND RIGHT HEART CATHETERIZATION WITH CORONARY ANGIOGRAM N/A 12/18/2013   Procedure: LEFT AND RIGHT HEART CATHETERIZATION WITH CORONARY ANGIOGRAM;  Surgeon: Peter M Martinique, MD;  [facility] C[facility] LAB;  Service: Cardiovascular;  Laterality: N/A;  . TEE WITHOUT CARDIOVERSION N/A 12/27/2013   Procedure: TRANSESOPHAGEAL ECHOCARDIOGRAM (TEE);  Surgeon: Grace Isaac, MD;  [facility];  Service: Open Heart Surgery;  Laterality: N/A;  . TISSUE AORTIC VALVE REPLACEMENT  02/09/2003   # 25 pericardial tissue valve  . TOTAL HIP ARTHROPLASTY Right 10/09/2015   Procedure:  RIGHT TOTAL HIP ARTHROPLASTY;  Surgeon: Latanya Maudlin, MD;  [facility];  Service: Orthopedics;  Laterality: Right;    Current Outpatient Prescriptions  Medication Sig Dispense Refill  . acetaminophen (TYLENOL) 500 MG tablet Take 1-2 tablets (500-1,000 mg total) by mouth every 8 (eight) hours as needed for mild pain or moderate pain. 30 tablet 0  . apixaban (ELIQUIS) 5 MG TABS tablet Take 1 tablet (5 mg total) by mouth 2 (two) times daily. 60 tablet 6  . brimonidine (ALPHAGAN) 0.2 % ophthalmic solution Place 1 drop into both eyes 2 (two) times daily.    . Cranberry 400 MG CAPS Take 400 mg by mouth at bedtime.    . Glucos-Chondroit-Hyaluron-MSM (GLUCOSAMINE CHONDROITIN JOINT PO) Take 1 tablet by mouth 3 (three) times daily with meals.    . latanoprost (XALATAN) 0.005 % ophthalmic solution Place 1 drop into both eyes at bedtime.     . Methylsulfonylmethane (MSM) 1000 MG CAPS Take 8,000-12,000 mg by mouth 4 (four) times daily.     . Omega-3 Fatty Acids (OMEGA-3 FISH OIL PO) Take 1 capsule by mouth 2 (two) times daily.    Marland Kitchen OVER THE COUNTER MEDICATION Take 400 mg by mouth daily. D-Mannose 400mg     . OVER THE COUNTER MEDICATION Take 1 capsule by mouth daily with lunch. AgilEase    . Probiotic Product (PROBIOTIC DAILY PO) Take 1 tablet by mouth daily.    . SAW PALMETTO-PUMPKIN SEED OIL PO Take 1 tablet by mouth daily.    . timolol (BETIMOL) 0.5 % ophthalmic solution Place 1 drop into both eyes every morning.    . TURMERIC CURCUMIN PO Take 1 capsule by mouth daily with lunch.    . vitamin C (ASCORBIC ACID) 500 MG tablet Take 500 mg by mouth 2 (two) times daily.     No current facility-administered medications for this visit.      Allergies:   Oxycodone   Social History:  The patient  reports that he has never smoked. He has never used smokeless tobacco. He reports that he does not drink alcohol or use drugs.   Family History:   family history includes Cancer in his father; Heart disease  in his mother.    Review of Systems: Review of Systems  Constitutional: Negative.   Respiratory: Negative.   Cardiovascular: Negative.   Gastrointestinal: Negative.   Musculoskeletal: Positive for joint pain.  Neurological: Negative.   Psychiatric/Behavioral: Negative.   All other systems reviewed and are negative.    PHYSICAL EXAM: Blood pressure 112/82, height 6' (1.829 m), weight 174 lb 4 oz (79 kg). Pulse 55 GEN: Well nourished, well developed, in no acute distress , presenting in a wheelchair HEENT: normal  Neck: no JVD, carotid bruits, or masses Cardiac: irregularly irregular, 2+ SEM RSB, no murmurs, rubs, or gallops,no edema  Respiratory:  clear to auscultation bilaterally, normal work of breathing GI: soft, nontender, nondistended, + BS MS: no deformity or atrophy  Skin: warm and dry, no rash Neuro:  Strength and sensation are intact Psych: euthymic mood, full affect    Recent Labs: 10/03/2015: ALT 21 10/11/2015: BUN 17; Creatinine, Ser 0.75; Potassium 3.9; Sodium 138 10/12/2015: Hemoglobin 10.5; Platelets 134    Lipid Panel Lab Results  Component Value Date   CHOL 187  07/13/2014   HDL 61.30 07/13/2014   LDLCALC 117 (H) 07/13/2014   TRIG 44.0 07/13/2014      Wt Readings from Last 3 Encounters:  06/30/16 174 lb 4 oz (79 kg)  11/01/15 172 lb (78 kg)  10/09/15 172 lb (78 kg)       ASSESSMENT AND PLAN:  S/P CABG x 2 Currently with no symptoms of angina. No further workup at this time. Continue current medication regimen.  S/P AVR Consider echocardiogram early 2019  Persistent atrial fibrillation ([place]) Diagnosed in 2017, asymptomatic  on eliquis 5 mg twice a day No rate controlling medications needed, current rate is in the 50s As he is asymptomatic, no plans to restore normal sinus rhythm  Systolic hypertension Lisinopril held in the past secondary to hypotension  Hypercholesterolemia he has declined cholesterol medication    Total  encounter time more than 25 minutes  Greater than 50% was spent in counseling and coordination of care with the patient   Disposition:   F/U  6 months   Signed, Esmond Plants, M.D., Ph.D. 06/30/2016  [place], [place]

## 2016-06-30 NOTE — Patient Instructions (Signed)

## 2016-07-29 DIAGNOSIS — Z0000 Encounter for general adult medical examination without abnormal findings: Secondary | ICD-10-CM | POA: Diagnosis not present

## 2016-07-29 DIAGNOSIS — E785 Hyperlipidemia, unspecified: Secondary | ICD-10-CM | POA: Diagnosis not present

## 2016-07-29 DIAGNOSIS — Z136 Encounter for screening for cardiovascular disorders: Secondary | ICD-10-CM | POA: Diagnosis not present

## 2016-07-29 DIAGNOSIS — Z125 Encounter for screening for malignant neoplasm of prostate: Secondary | ICD-10-CM | POA: Diagnosis not present

## 2016-07-29 DIAGNOSIS — Z9181 History of falling: Secondary | ICD-10-CM | POA: Diagnosis not present

## 2016-07-29 DIAGNOSIS — Z1389 Encounter for screening for other disorder: Secondary | ICD-10-CM | POA: Diagnosis not present

## 2016-08-05 IMAGING — CT CT ANGIO CHEST
2 of 5 series · 9 of 30 positions shown · IV contrast (80CC OMNI 350)
Comparison: Plain film 12/12/2013.

CLINICAL DATA: Aortic stenosis. Evaluate aortic root size. Prior
aortic valve replacement 02/08/2013.

EXAM:
CT ANGIOGRAPHY CHEST WITH CONTRAST
TECHNIQUE: Multidetector CT imaging of the chest was performed using the
standard protocol during bolus administration of intravenous
contrast. Multiplanar CT image reconstructions and MIPs were
obtained to evaluate the vascular anatomy.
CONTRAST:  80mL OMNIPAQUE IOHEXOL 350 MG/ML SOLN

[Series 4: angio · axial · 0.72mm/px · z∈[-266,-58]mm · 4 of 139 slices shown]
[im 28/139  lung]
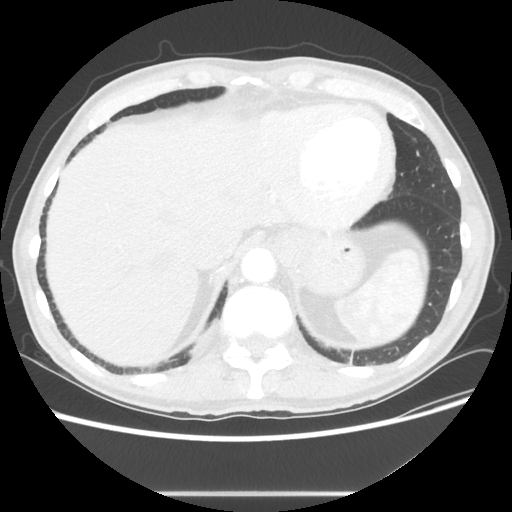
[im 56/139  mediastinal]
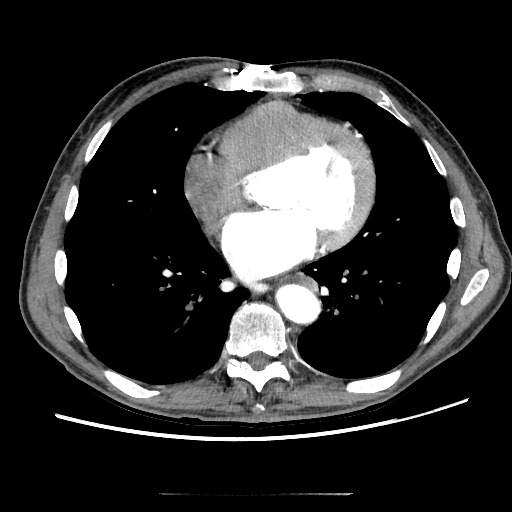
[im 83/139  lung]
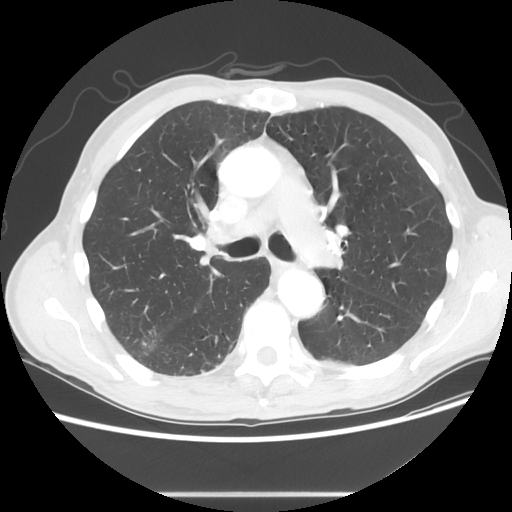
[im 111/139  mediastinal]
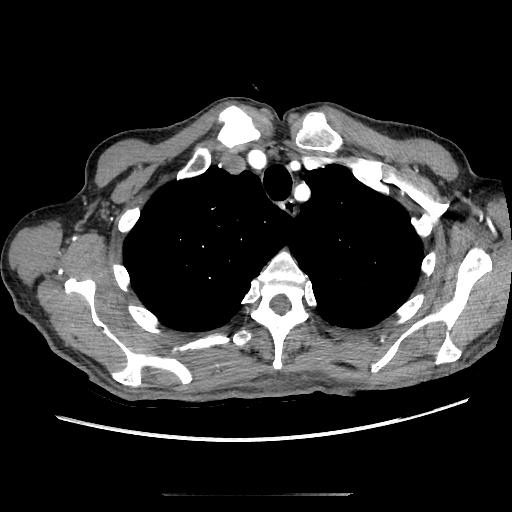

[Series 602: sagittal body · sagittal · 0.72mm/px · 5 of 150 slices shown]
[im 25/150  lung]
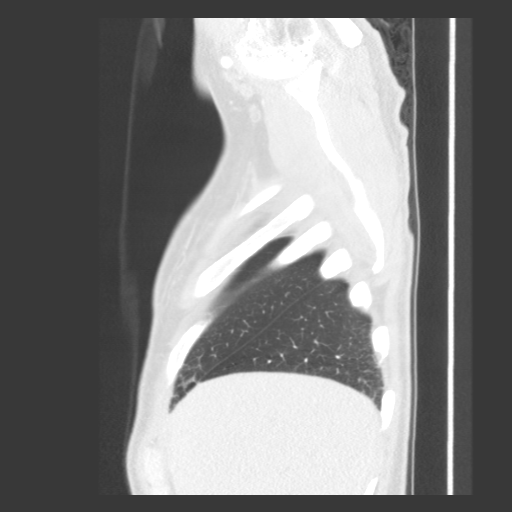
[im 50/150  lung]
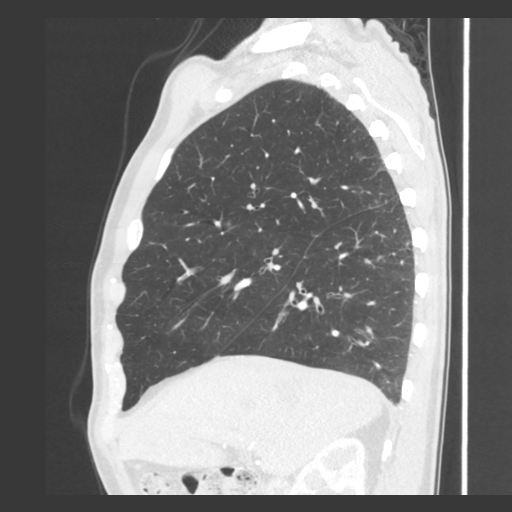
[im 75/150  lung]
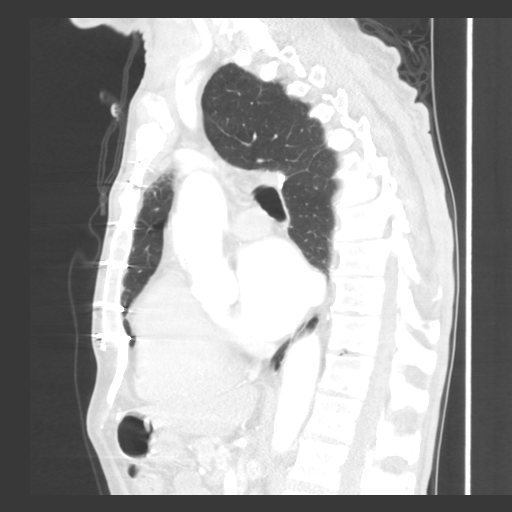
[im 100/150  lung]
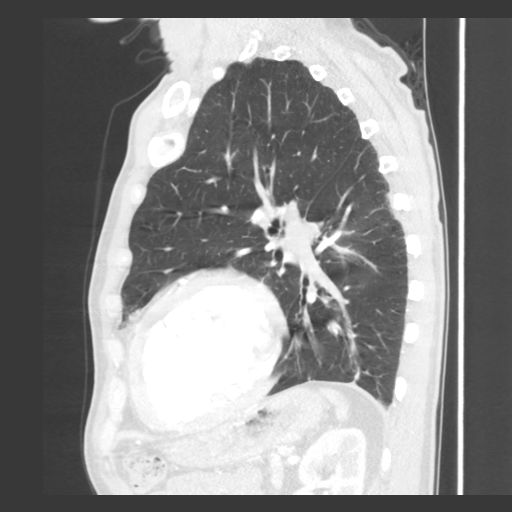
[im 125/150  lung]
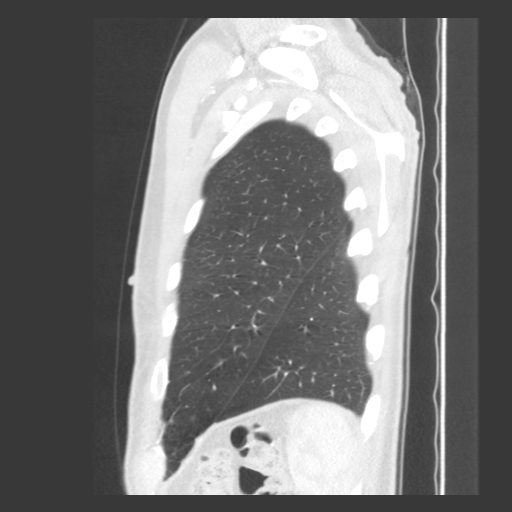

[9 of 30 positions shown; findings below may reference images not displayed]

FINDINGS: Lungs/Pleura: 7 mm right lower lobe lung nodule which is sub solid
in morphology, including on image 46 of series 5.

Mild thickening along the right major fissure on image 35. Posterior
right upper lobe calcified granuloma. Left base scarring.

No pleural fluid.  Minimal left-sided pleural thickening is seen.

Heart/Mediastinum: Atherosclerosis within the great vessels, which
are normal in caliber. Status post aortic valve repair. Tortuous
thoracic aorta. Ascending aorta best evaluated on coronal
reformatted images. The aorta measures 3.2 cm at the sinuses of
Valsalva. 2.5 cm at the [place] junction. 3.2 cm approximately
3 cm above the [place] junction. No dissection.

Moderate cardiomegaly. Multivessel coronary artery atherosclerosis.
No pericardial effusion. Multiple small middle mediastinal nodes.
None are pathologic by size criteria. No hilar adenopathy.

Upper Abdomen: Suspicion of gallstones. Normal imaged portions of
the liver, spleen, stomach, adrenal glands, left kidney. Suspect
incompletely imaged right renal cyst. Low-density foci within the
pancreatic body and head are likely areas of fatty
atrophy/replacement.

Bones/Musculoskeletal:  No acute osseous abnormality.

Review of the MIP images confirms the above findings.
IMPRESSION: 1. Tortuous thoracic aorta, without evidence of aortic aneurysm.
2.  Atherosclerosis, including within the coronary arteries.
3. Probable cholelithiasis.
4. 7 mm sub solid right lower lobe pulmonary nodule. Per consensus
criteria, this warrants followup chest CT at 3 months. This
recommendation follows the consensus statement: Recommendations for
the Management of Subsolid Pulmonary Nodules Detected at CT: A

## 2016-09-03 ENCOUNTER — Other Ambulatory Visit: Payer: Self-pay | Admitting: Cardiovascular Disease

## 2016-11-06 DIAGNOSIS — H348392 Tributary (branch) retinal vein occlusion, unspecified eye, stable: Secondary | ICD-10-CM | POA: Diagnosis not present

## 2016-11-10 DIAGNOSIS — H348392 Tributary (branch) retinal vein occlusion, unspecified eye, stable: Secondary | ICD-10-CM | POA: Diagnosis not present

## 2016-12-01 DIAGNOSIS — H348392 Tributary (branch) retinal vein occlusion, unspecified eye, stable: Secondary | ICD-10-CM | POA: Diagnosis not present

## 2016-12-09 DIAGNOSIS — Z79899 Other long term (current) drug therapy: Secondary | ICD-10-CM | POA: Diagnosis not present

## 2016-12-09 DIAGNOSIS — Z125 Encounter for screening for malignant neoplasm of prostate: Secondary | ICD-10-CM | POA: Diagnosis not present

## 2016-12-09 DIAGNOSIS — I4891 Unspecified atrial fibrillation: Secondary | ICD-10-CM | POA: Diagnosis not present

## 2016-12-09 DIAGNOSIS — Z952 Presence of prosthetic heart valve: Secondary | ICD-10-CM | POA: Diagnosis not present

## 2016-12-09 DIAGNOSIS — I2510 Atherosclerotic heart disease of native coronary artery without angina pectoris: Secondary | ICD-10-CM | POA: Diagnosis not present

## 2016-12-28 DIAGNOSIS — H34832 Tributary (branch) retinal vein occlusion, left eye, with macular edema: Secondary | ICD-10-CM | POA: Diagnosis not present

## 2017-01-18 ENCOUNTER — Other Ambulatory Visit: Payer: Self-pay

## 2017-01-18 MED ORDER — APIXABAN 5 MG PO TABS: 5.0000 mg | ORAL_TABLET | Freq: Two times a day (BID) | ORAL | 3 refills | Status: DC

## 2017-01-18 NOTE — Telephone Encounter (Signed)
Please review for refill. Thanks!  

## 2017-03-01 DIAGNOSIS — H34832 Tributary (branch) retinal vein occlusion, left eye, with macular edema: Secondary | ICD-10-CM | POA: Diagnosis not present

## 2017-05-21 ENCOUNTER — Other Ambulatory Visit: Payer: Self-pay | Admitting: Pharmacist

## 2017-05-21 MED ORDER — APIXABAN 5 MG PO TABS: 5.0000 mg | ORAL_TABLET | Freq: Two times a day (BID) | ORAL | 3 refills | Status: DC

## 2017-05-31 DIAGNOSIS — H34832 Tributary (branch) retinal vein occlusion, left eye, with macular edema: Secondary | ICD-10-CM | POA: Diagnosis not present

## 2017-06-21 ENCOUNTER — Telehealth: Payer: Self-pay | Admitting: Cardiovascular Disease

## 2017-06-21 DIAGNOSIS — Z7901 Long term (current) use of anticoagulants: Secondary | ICD-10-CM

## 2017-06-21 MED ORDER — APIXABAN 5 MG PO TABS: 5.0000 mg | ORAL_TABLET | Freq: Two times a day (BID) | ORAL | 0 refills | Status: DC

## 2017-06-21 NOTE — Telephone Encounter (Signed)
°*  STAT* If patient is at the pharmacy, call can be transferred to refill team.   1. Which medications need to be refilled? (please list name of each medication and dose if known) Eliquis 5 MG - 1 tablet 2 times daily  2. Which pharmacy/location (including street and city if local pharmacy) is medication to be sent to? [facility] in [place] on the corner of [address] and [address].  3. Do they need a 30 day or 90 day supply?  90 day

## 2017-06-21 NOTE — Telephone Encounter (Signed)
1 month supply sent in, as pt is overdue for BMET to assess kidney function.  Pt has appt w/ Dr. Rockey Situ on 07/20/17 - will place order to have labs drawn at that time.

## 2017-06-21 NOTE — Telephone Encounter (Signed)
Please review for refill, Thanks !  

## 2017-06-22 NOTE — Telephone Encounter (Signed)
  °*  STAT* If patient is at the pharmacy, call can be transferred to refill team.   1. Which medications need to be refilled? (please list name of each medication and dose if known) Eliquis 5 MG - 1 tablet 2 times daily  2. Which pharmacy/location (including street and city if local pharmacy) is medication to be sent to? [facility] in [place] on the corner of [address].  3. Do they need a 30 day or 90 day supply?  90 day  Patient daughter states the pharmacy had not received yesterday evening Sending request again - patient daughter is calling pharmacy again as well

## 2017-06-22 NOTE — Telephone Encounter (Signed)
Pt daughter called back, states rx was sent to [facility], and this pharmacy is going to transfer this rx to [place], [facility]

## 2017-06-22 NOTE — Telephone Encounter (Signed)
Spoke with pt's daughter and she states they do have his refill from [facility] for Eliquis  and stressed to her the importance of his keeping his appt with Dr Rockey Situ in July as he needs lab work done for any refills for [employer] and she states understanding

## 2017-06-22 NOTE — Telephone Encounter (Signed)
PLEASE CHANGE PHARMACY TO [facility].   Patient daughter concerned he missed dosage last night and has not take this morning .  Please call daughter to discuss expectation of time refill will be ready and if samples need to be obtained so patient doesn't have to skip another dose.

## 2017-06-22 NOTE — Telephone Encounter (Signed)
This encounter was created in error - please disregard.

## 2017-06-22 NOTE — Telephone Encounter (Signed)
Daughter concerned that patient is at high risj for stroke with hx of afib.  Please call to update status .

## 2017-07-20 ENCOUNTER — Ambulatory Visit: Payer: PPO | Admitting: Cardiovascular Disease

## 2017-07-21 NOTE — Progress Notes (Signed)
Cardiology Office Note  Date:  07/23/2017   ID:  [name] [name], DOB 1940-10-03, MRN [id number]  PCP:  Leonides Sake, MD   Chief Complaint  Patient presents with  . other    Hip surgery sched for 9/27.  Needs cardiac clearance.  Hip replacement    HPI:  [name] is a pleasant 77 year old gentleman with a history of  Permanent atrial fibrillation coronary artery disease,  bypass surgery December 2015 with bioprosthetic aortic valve  prior AVR 2005, redo December 2015 CABG, AVR hyperlipidemia, declined cholesterol medication Most recent ejection fraction March 2016 was greater than 60%  atrial fibrillation on routine EKG in preop clinic before hip surgery, Dx in 2017, asymptomatic who presents for routine follow-up of his coronary disease, aortic valve prosthesis and for preoperative evaluation   Presents today with his daughter  In general he reports he is doing well, denies any anginal symptoms Without his hearing aids Not as active, walking has slowed  No SOB Daughter reports he had Retinal occlusion, etiology unclear Notes not available  tolerating eliquis Works in his workshop, limited by vision problems  Last echocardiogram March 2016 Normal ejection fraction Aortic valve mean gradient 10 mmHg  EKG personally reviewed by myself on todays visit Shows atrial fibrillation with rate 68 bpm no significant ST or T-wave changes  Lab work reviewed with him showing total cholesterol 185 LDL 113 glucose 107 creatinine 1.16 normal LFTs  Other past medical history Carotid ultrasound December 2015 less than 39% bilaterally CT scan of the chest from May 2016 pulled up in the office showing diffuse heavy LAD and circumflex one area atherosclerosis, minimal in the aorta  October 2015 when he began to experience symptoms of increased dyspnea and orthopnea.   echocardiogram on 12/12/13 which showed evidence of prosthetic valve failure with moderate aortic stenosis and moderate  aortic insufficiency. His left ventricle was dilated and his ejection fraction had fallen to 30-35%.    cardiac catheterization and then underwent a redo aortic valve replacement with a pericardial tissue valve as well as two-vessel coronary artery bypass graft surgery by Dr. Servando Snare on 12/27/13. The patient had postoperative atrial fibrillation.  He was discharged home on amiodarone   PMH:   has a past medical history of Aortic stenosis, Arthritis, CHF (congestive heart failure) ([name]), Coronary artery disease, Glaucoma, Heart murmur, Hyperlipidemia, and Hypertension.  PSH:    Past Surgical History:  Procedure Laterality Date  . AORTIC VALVE REPLACEMENT N/A 12/27/2013   Procedure: REDO AORTIC VALVE REPLACEMENT WITH 23MM MAGNA EASE PERICARDIAL TISSUE VALVE;  Surgeon: Grace Isaac, MD;  Location: [place];  Service: Open Heart Surgery;  Laterality: N/A;  . CARDIAC CATHETERIZATION  02/08/2003   severe aortic stenosis,normal left ventricular function  . CORONARY ARTERY BYPASS GRAFT N/A 12/27/2013   Procedure: CORONARY ARTERY BYPASS GRAFTING TIMES TWO USING RIGHT LEG SAPHENOUS VEIN TO RIGHT CORONARY ARTERY AND LEFT INTERNAL MAMMARY ARTERY TO LEFT ANTERIOR DESCENDING ARTERY;  Surgeon: Grace Isaac, MD;  Location: [place];  Service: Open Heart Surgery;  Laterality: N/A;  . HERNIA REPAIR    . INGUINAL HERNIA REPAIR  04/2003  . INGUINAL HERNIA REPAIR Left 12/18/2014   Procedure: OPEN LEFT INGUINAL HERNIA REPAIR;  Surgeon: Excell Seltzer, MD;  Location: [facility];  Service: General;  Laterality: Left;  . INSERTION OF MESH Left 12/18/2014   Procedure: INSERTION OF MESH;  Surgeon: Excell Seltzer, MD;  Location: [facility];  Service: General;  Laterality: Left;  . LEFT  AND RIGHT HEART CATHETERIZATION WITH CORONARY ANGIOGRAM N/A 12/18/2013   Procedure: LEFT AND RIGHT HEART CATHETERIZATION WITH CORONARY ANGIOGRAM;  Surgeon: Peter M Martinique, MD;  Location: [facility] CATH [facility];  Service: Cardiovascular;   Laterality: N/A;  . TEE WITHOUT CARDIOVERSION N/A 12/27/2013   Procedure: TRANSESOPHAGEAL ECHOCARDIOGRAM (TEE);  Surgeon: Grace Isaac, MD;  Location: [place];  Service: Open Heart Surgery;  Laterality: N/A;  . TISSUE AORTIC VALVE REPLACEMENT  02/09/2003   # 25 pericardial tissue valve  . TOTAL HIP ARTHROPLASTY Right 10/09/2015   Procedure: RIGHT TOTAL HIP ARTHROPLASTY;  Surgeon: Latanya Maudlin, MD;  Location: [facility];  Service: Orthopedics;  Laterality: Right;    Current Outpatient Medications  Medication Sig Dispense Refill  . acetaminophen (TYLENOL) 500 MG tablet Take 1-2 tablets (500-1,000 mg total) by mouth every 8 (eight) hours as needed for mild pain or moderate pain. 30 tablet 0  . apixaban (ELIQUIS) 5 MG TABS tablet Take 1 tablet (5 mg total) by mouth 2 (two) times daily. 60 tablet 0  . brimonidine (ALPHAGAN) 0.2 % ophthalmic solution Place 1 drop into both eyes 2 (two) times daily.    . Cranberry 400 MG CAPS Take 400 mg by mouth at bedtime.    . Glucos-Chondroit-Hyaluron-MSM (GLUCOSAMINE CHONDROITIN JOINT PO) Take 1 tablet by mouth 3 (three) times daily with meals.    . latanoprost (XALATAN) 0.005 % ophthalmic solution Place 1 drop into both eyes at bedtime.     . Methylsulfonylmethane (MSM) 1000 MG CAPS Take 8,000-12,000 mg by mouth 4 (four) times daily.     . Omega-3 Fatty Acids (OMEGA-3 FISH OIL PO) Take 1 capsule by mouth 2 (two) times daily.    Marland Kitchen OVER THE COUNTER MEDICATION Take 400 mg by mouth daily. D-Mannose 400mg     . OVER THE COUNTER MEDICATION Take 1 capsule by mouth daily with lunch. AgilEase    . Probiotic Product (PROBIOTIC DAILY PO) Take 1 tablet by mouth daily.    . SAW PALMETTO-PUMPKIN SEED OIL PO Take 1 tablet by mouth daily.    . timolol (BETIMOL) 0.5 % ophthalmic solution Place 1 drop into both eyes every morning.    . TURMERIC CURCUMIN PO Take 1 capsule by mouth daily with lunch.    . vitamin C (ASCORBIC ACID) 500 MG tablet Take 500 mg by mouth 2 (two) times  daily.     No current facility-administered medications for this visit.      Allergies:   Oxycodone   Social History:  The patient  reports that he has never smoked. He has never used smokeless tobacco. He reports that he does not drink alcohol or use drugs.   Family History:   family history includes Cancer in his father; Heart disease in his mother.    Review of Systems: Review of Systems  Constitutional: Negative.   Eyes:       Vision deficit  Respiratory: Negative.   Cardiovascular: Negative.   Gastrointestinal: Negative.   Musculoskeletal: Positive for joint pain.  Neurological: Negative.   Psychiatric/Behavioral: Negative.   All other systems reviewed and are negative.    PHYSICAL EXAM: BP 118/79 (BP Location: Left Arm, Patient Position: Sitting, Cuff Size: Normal)   Pulse 68   Ht 6' (1.829 m)   Wt 174 lb (78.9 kg)   BMI 23.60 kg/m  Constitutional:  oriented to person, place, and time. No distress.  HENT:  Head: Normocephalic and atraumatic.  Eyes:  no discharge. No scleral icterus.  Neck: Normal range  of motion. Neck supple. No JVD present.  Cardiovascular: irregularly irregular normal heart sounds and intact distal pulses. Exam reveals no gallop and no friction rub. No edema No murmur heard. Pulmonary/Chest: Effort normal and breath sounds normal. No stridor. No respiratory distress.  no wheezes.  no rales.  no tenderness.  Abdominal: Soft.  no distension.  no tenderness.  Musculoskeletal: Normal range of motion.  no  tenderness or deformity.  Neurological:  normal muscle tone. Coordination normal. No atrophy Skin: Skin is warm and dry. No rash noted. not diaphoretic.  Psychiatric:  normal mood and affect. behavior is normal. Thought content normal.    Recent Labs: No results found for requested labs within last 8760 hours.    Lipid Panel Lab Results  Component Value Date   CHOL 187 07/13/2014   HDL 61.30 07/13/2014   LDLCALC 117 (H) 07/13/2014    TRIG 44.0 07/13/2014      Wt Readings from Last 3 Encounters:  07/23/17 174 lb (78.9 kg)  06/30/16 174 lb 4 oz (79 kg)  11/01/15 172 lb (78 kg)       ASSESSMENT AND PLAN:  S/P CABG x 2 Currently with no symptoms of angina. No further workup at this time. Continue current medication regimen.stable  S/P AVR We have ordered repeat echocardiogram Last study 2016  Persistent atrial fibrillation ([place]) Diagnosed in 2017, asymptomatic  on eliquis 5 mg twice a day No rate controlling medications needed  asymptomatic  Systolic hypertension Lisinopril held in the past held secondary to hypotension Blood pressure continues to be stable without medication  Hypercholesterolemia Wife continues to discourage use of cholesterol medication We have recommended he start Zetia 10 mg daily He does not want a statin, keeps looking at his wife for approval.    Total encounter time more than 25 minutes  Greater than 50% was spent in counseling and coordination of care with the patient   Disposition:   F/U  12 months   Signed, Esmond Plants, M.D., Ph.D. 07/23/2017  [place], [place]

## 2017-07-22 ENCOUNTER — Other Ambulatory Visit: Payer: Self-pay | Admitting: Cardiovascular Disease

## 2017-07-22 NOTE — Telephone Encounter (Signed)
Refill Request.  

## 2017-07-22 NOTE — Telephone Encounter (Signed)
Patient daughter calling, would like to change location of where Eliquis prescription being sent   *STAT* If patient is at the pharmacy, call can be transferred to refill team.   1. Which medications need to be refilled? (please list name of each medication and dose if known) Eliquis   2. Which pharmacy/location (including street and city if local pharmacy) [facility] [address]   3. Do they need a 30 day or 90 day supply?

## 2017-07-22 NOTE — Telephone Encounter (Signed)
I called and spoke with Lattie Haw and advised her I will send in a refill for the patient's Eliquis. [facility] [place]. I advised her to have the patient keep his follow up appointment tomorrow and

## 2017-07-22 NOTE — Telephone Encounter (Signed)
Patient daughter calling to let us know patient is out of meds and will not have dose for tonight and tomorrow.  Please call to advise if it is ok to skip these doses or if an rx can be called in for these until labs can be done .    Please call daughter to advise

## 2017-07-23 ENCOUNTER — Other Ambulatory Visit: Payer: Self-pay

## 2017-07-23 ENCOUNTER — Encounter: Payer: Self-pay | Admitting: Cardiovascular Disease

## 2017-07-23 ENCOUNTER — Ambulatory Visit: Payer: PPO | Admitting: Cardiovascular Disease

## 2017-07-23 VITALS — BP 118/79 | HR 68 | Ht 72.0 in | Wt 174.0 lb

## 2017-07-23 DIAGNOSIS — I25118 Atherosclerotic heart disease of native coronary artery with other forms of angina pectoris: Secondary | ICD-10-CM | POA: Insufficient documentation

## 2017-07-23 DIAGNOSIS — I482 Chronic atrial fibrillation, unspecified: Secondary | ICD-10-CM

## 2017-07-23 DIAGNOSIS — Z951 Presence of aortocoronary bypass graft: Secondary | ICD-10-CM

## 2017-07-23 DIAGNOSIS — Z7901 Long term (current) use of anticoagulants: Secondary | ICD-10-CM

## 2017-07-23 DIAGNOSIS — I350 Nonrheumatic aortic (valve) stenosis: Secondary | ICD-10-CM | POA: Diagnosis not present

## 2017-07-23 DIAGNOSIS — I10 Essential (primary) hypertension: Secondary | ICD-10-CM

## 2017-07-23 DIAGNOSIS — E7800 Pure hypercholesterolemia, unspecified: Secondary | ICD-10-CM | POA: Diagnosis not present

## 2017-07-23 DIAGNOSIS — Z952 Presence of prosthetic heart valve: Secondary | ICD-10-CM

## 2017-07-23 MED ORDER — APIXABAN 5 MG PO TABS: 5.0000 mg | ORAL_TABLET | Freq: Two times a day (BID) | ORAL | 0 refills | Status: DC

## 2017-07-23 MED ORDER — APIXABAN 5 MG PO TABS: 5.0000 mg | ORAL_TABLET | Freq: Two times a day (BID) | ORAL | 4 refills | Status: DC

## 2017-07-23 MED ORDER — EZETIMIBE 10 MG PO TABS: 10.0000 mg | ORAL_TABLET | Freq: Every day | ORAL | 4 refills | Status: DC

## 2017-07-23 NOTE — Telephone Encounter (Signed)
I called and spoke with Lattie Haw to inquire if the patient had the Eliquis RX and which pharmacy it needed to go to. She states he has his meds- [facility] [place] [facility] and so he picked up a #[facility] supply [facility] [address]  His #90 supply was sent to the [facility] in [place] after his appointment today and this is correct per Lattie Haw.

## 2017-07-23 NOTE — Telephone Encounter (Signed)
Resent medication to correct pharmacy.   apixaban (ELIQUIS) 5 MG TABS tablet 60 tablet 0 07/23/2017    Sig - Route: Take 1 tablet (5 mg total) by mouth 2 (two) times daily. - Oral   Sent to pharmacy as: apixaban (ELIQUIS) 5 MG Tab tablet   Notes to Pharmacy: Pt needs labs at [facility] 7/9 to assess kidney function prior to further refills   E-Prescribing Status: Receipt confirmed by pharmacy (07/23/2017 7:26 AM EDT)   Pharmacy   [id number] [place], [place]

## 2017-07-23 NOTE — Patient Instructions (Addendum)
Medication Instructions:   Please start zetia once a day for cholesterol  Labwork:  No new labs needed  Testing/Procedures:  We will order an echocardiogram for AVR   Follow-Up: It was a pleasure seeing you in the office today. Please call us if you have new issues that need to be addressed before your next appt.  [phone]  Your physician wants you to follow-up in: 12 months.  You will receive a reminder letter in the mail two months in advance. If you don't receive a letter, please call our office to schedule the follow-up appointment.  If you need a refill on your cardiac medications before your next appointment, please call your pharmacy.  For educational health videos Log in to : [email or web address] Or : [email or web address], password : [email or web address]

## 2017-08-04 ENCOUNTER — Other Ambulatory Visit: Payer: Self-pay

## 2017-08-04 ENCOUNTER — Ambulatory Visit (INDEPENDENT_AMBULATORY_CARE_PROVIDER_SITE_OTHER): Payer: PPO

## 2017-08-04 DIAGNOSIS — Z952 Presence of prosthetic heart valve: Secondary | ICD-10-CM

## 2017-08-04 DIAGNOSIS — I350 Nonrheumatic aortic (valve) stenosis: Secondary | ICD-10-CM

## 2017-08-18 DIAGNOSIS — H401233 Low-tension glaucoma, bilateral, severe stage: Secondary | ICD-10-CM | POA: Diagnosis not present

## 2017-11-01 DIAGNOSIS — E785 Hyperlipidemia, unspecified: Secondary | ICD-10-CM | POA: Diagnosis not present

## 2017-11-01 DIAGNOSIS — Z1339 Encounter for screening examination for other mental health and behavioral disorders: Secondary | ICD-10-CM | POA: Diagnosis not present

## 2017-11-01 DIAGNOSIS — Z125 Encounter for screening for malignant neoplasm of prostate: Secondary | ICD-10-CM | POA: Diagnosis not present

## 2017-11-01 DIAGNOSIS — Z1331 Encounter for screening for depression: Secondary | ICD-10-CM | POA: Diagnosis not present

## 2017-11-01 DIAGNOSIS — Z136 Encounter for screening for cardiovascular disorders: Secondary | ICD-10-CM | POA: Diagnosis not present

## 2017-11-01 DIAGNOSIS — Z139 Encounter for screening, unspecified: Secondary | ICD-10-CM | POA: Diagnosis not present

## 2017-11-01 DIAGNOSIS — Z9181 History of falling: Secondary | ICD-10-CM | POA: Diagnosis not present

## 2017-11-01 DIAGNOSIS — Z0000 Encounter for general adult medical examination without abnormal findings: Secondary | ICD-10-CM | POA: Diagnosis not present

## 2017-11-16 DIAGNOSIS — H401223 Low-tension glaucoma, left eye, severe stage: Secondary | ICD-10-CM | POA: Diagnosis not present

## 2017-11-24 DIAGNOSIS — H401233 Low-tension glaucoma, bilateral, severe stage: Secondary | ICD-10-CM | POA: Diagnosis not present

## 2017-12-03 DIAGNOSIS — Z79899 Other long term (current) drug therapy: Secondary | ICD-10-CM | POA: Diagnosis not present

## 2017-12-03 DIAGNOSIS — I4891 Unspecified atrial fibrillation: Secondary | ICD-10-CM | POA: Diagnosis not present

## 2017-12-03 DIAGNOSIS — I2510 Atherosclerotic heart disease of native coronary artery without angina pectoris: Secondary | ICD-10-CM | POA: Diagnosis not present

## 2017-12-03 DIAGNOSIS — N400 Enlarged prostate without lower urinary tract symptoms: Secondary | ICD-10-CM | POA: Diagnosis not present

## 2017-12-03 DIAGNOSIS — Z6824 Body mass index (BMI) 24.0-24.9, adult: Secondary | ICD-10-CM | POA: Diagnosis not present

## 2017-12-03 DIAGNOSIS — Z952 Presence of prosthetic heart valve: Secondary | ICD-10-CM | POA: Diagnosis not present

## 2017-12-03 DIAGNOSIS — Z125 Encounter for screening for malignant neoplasm of prostate: Secondary | ICD-10-CM | POA: Diagnosis not present

## 2017-12-29 DIAGNOSIS — H401233 Low-tension glaucoma, bilateral, severe stage: Secondary | ICD-10-CM | POA: Diagnosis not present

## 2017-12-30 DIAGNOSIS — R51 Headache: Secondary | ICD-10-CM | POA: Diagnosis not present

## 2017-12-30 DIAGNOSIS — R410 Disorientation, unspecified: Secondary | ICD-10-CM | POA: Diagnosis not present

## 2017-12-30 DIAGNOSIS — D649 Anemia, unspecified: Secondary | ICD-10-CM | POA: Diagnosis not present

## 2017-12-30 DIAGNOSIS — I4891 Unspecified atrial fibrillation: Secondary | ICD-10-CM | POA: Diagnosis not present

## 2017-12-30 DIAGNOSIS — N390 Urinary tract infection, site not specified: Secondary | ICD-10-CM | POA: Diagnosis not present

## 2017-12-30 DIAGNOSIS — Z6823 Body mass index (BMI) 23.0-23.9, adult: Secondary | ICD-10-CM | POA: Diagnosis not present

## 2018-01-03 DIAGNOSIS — H539 Unspecified visual disturbance: Secondary | ICD-10-CM | POA: Diagnosis not present

## 2018-01-03 DIAGNOSIS — I4891 Unspecified atrial fibrillation: Secondary | ICD-10-CM | POA: Diagnosis not present

## 2018-01-03 DIAGNOSIS — D509 Iron deficiency anemia, unspecified: Secondary | ICD-10-CM | POA: Diagnosis not present

## 2018-01-03 DIAGNOSIS — R410 Disorientation, unspecified: Secondary | ICD-10-CM | POA: Diagnosis not present

## 2018-01-03 DIAGNOSIS — R413 Other amnesia: Secondary | ICD-10-CM | POA: Diagnosis not present

## 2018-01-03 DIAGNOSIS — R05 Cough: Secondary | ICD-10-CM | POA: Diagnosis not present

## 2018-01-14 ENCOUNTER — Encounter: Payer: Self-pay | Admitting: Cardiovascular Disease

## 2018-01-14 NOTE — Telephone Encounter (Signed)
This encounter was created in error - please disregard.

## 2018-01-15 NOTE — Progress Notes (Signed)
Cardiology Office Note  Date:  01/17/2018   ID:  [name] [name], [name] [name] August 02, 1940, MRN [id number]  PCP:  Cyndi Bender, PA-C   Chief Complaint  Patient presents with  . other    Pt request to be seen before year f/u no complaints today. Meds reviewed verbally with pt.   HPI:  [name] [name] is a pleasant 78 year old gentleman with a history of  Permanent atrial fibrillation coronary artery disease,  bypass surgery December 2015 with bioprosthetic aortic valve  prior AVR 2005, redo December 2015 CABG, AVR hyperlipidemia, declined cholesterol medication Most recent ejection fraction March 2016 was greater than 60%  atrial fibrillation on routine EKG in preop clinic before hip surgery, Dx in 2017, asymptomatic who presents for routine follow-up of his coronary disease, aortic valve prosthesis   In follow-up today presents with his daughter She reports that he has had acute onset of various symptoms including headache, fall, vision changes Since these acute symptoms presented he has had weight loss of 17 pounds Unclear if he was having upper respiratory infection or urinary tract infection was treated with Z-Pak Little more energy recently after ABX per the daughter Vision changes still affecting his ability to do things around the house Confusion, weakness has persisted perhaps mildly improved Appetite little better but still poor Low BP, presumably from weight loss Sleeping more Legs are markedly weak getting up from chair to the exam table  He denies any significant shortness of breath tolerating eliquis Used to work in his workshop, 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 His left ventricle was dilated and his ejection fraction had fallen to 30-35%.    cardiac catheterization and then underwent a redo aortic valve replacement with a pericardial tissue valve as well as two-vessel coronary artery bypass graft surgery by Dr. Servando Snare on 12/27/13. The patient had postoperative atrial fibrillation.  He was discharged home on amiodarone   PMH:   has a past medical history of Aortic stenosis, Arthritis, CHF (congestive heart failure) ([place]), Coronary artery disease, Glaucoma, Heart murmur, Hyperlipidemia, Hypertension, and Vision loss.  PSH:    Past Surgical History:  Procedure Laterality Date  . AORTIC VALVE REPLACEMENT N/A 12/27/2013   Procedure: REDO AORTIC VALVE REPLACEMENT WITH 23MM MAGNA EASE PERICARDIAL TISSUE VALVE;  Surgeon: Grace Isaac, MD;  Location: [place];  Service: Open Heart Surgery;  Laterality: N/A;  . CARDIAC CATHETERIZATION  02/08/2003   severe aortic stenosis,normal left ventricular function  . CORONARY ARTERY BYPASS GRAFT N/A 12/27/2013   Procedure: CORONARY ARTERY BYPASS GRAFTING TIMES TWO USING RIGHT LEG SAPHENOUS VEIN TO RIGHT CORONARY ARTERY AND LEFT INTERNAL MAMMARY ARTERY TO LEFT ANTERIOR DESCENDING ARTERY;  Surgeon: Grace Isaac, MD;  Location: [place];  Service: Open Heart Surgery;  Laterality: N/A;  . HERNIA REPAIR    . INGUINAL HERNIA REPAIR  04/2003  . INGUINAL HERNIA REPAIR Left 12/18/2014   Procedure: OPEN LEFT INGUINAL HERNIA  REPAIR;  Surgeon: Excell Seltzer, MD;  [facility];  Service: General;  Laterality: Left;  . INSERTION OF MESH Left 12/18/2014   Procedure: INSERTION OF MESH;  Surgeon: Excell Seltzer, MD;  Location:  [facility];  Service: General;  Laterality: Left;  . LEFT AND RIGHT HEART CATHETERIZATION WITH CORONARY ANGIOGRAM N/A 12/18/2013   Procedure: LEFT AND RIGHT HEART CATHETERIZATION WITH CORONARY ANGIOGRAM;  Surgeon: Peter M Martinique, MD;  Location: [facility] C[facility] LAB;  Service: Cardiovascular;  Laterality: N/A;  . TEE WITHOUT CARDIOVERSION N/A 12/27/2013   Procedure: TRANSESOPHAGEAL ECHOCARDIOGRAM (TEE);  Surgeon: Grace Isaac, MD;  Location: [place];  Service: Open Heart Surgery;  Laterality: N/A;  . TISSUE AORTIC VALVE REPLACEMENT  02/09/2003   # 25 pericardial tissue valve  . TOTAL HIP ARTHROPLASTY Right 10/09/2015   Procedure: RIGHT TOTAL HIP ARTHROPLASTY;  Surgeon: Latanya Maudlin, MD;  [facility];  Service: Orthopedics;  Laterality: Right;    Current Outpatient Medications  Medication Sig Dispense Refill  . acetaminophen (TYLENOL) 500 MG tablet Take 1-2 tablets (500-1,000 mg total) by mouth every 8 (eight) hours as needed for mild pain or moderate pain. 30 tablet 0  . apixaban (ELIQUIS) 5 MG TABS tablet Take 1 tablet (5 mg total) by mouth 2 (two) times daily. 180 tablet 4  . Ascorbic Acid (VITAMIN C PO) Take 1,000 mg by mouth daily.    . brimonidine (ALPHAGAN) 0.2 % ophthalmic solution Place 1 drop into both eyes 2 (two) times daily.    . Cranberry 400 MG CAPS Take 400 mg by mouth at bedtime.    . ferrous sulfate 325 (65 FE) MG EC tablet Take 325 mg by mouth daily with breakfast.    . Glucos-Chondroit-Hyaluron-MSM (GLUCOSAMINE CHONDROITIN JOINT PO) Take 1 tablet by mouth 3 (three) times daily with meals.    . latanoprost (XALATAN) 0.005 % ophthalmic solution Place 1 drop into both eyes at bedtime.     . Methylsulfonylmethane (MSM) 1000 MG CAPS Take 8,000-12,000 mg by mouth 4 (four) times daily.     . Omega-3 Fatty Acids (OMEGA-3 FISH OIL PO) Take 1 capsule by mouth as needed.     Marland Kitchen OVER THE COUNTER MEDICATION Take 400 mg by mouth daily. D-Mannose 400mg     . OVER THE COUNTER MEDICATION Take 1  capsule by mouth daily with lunch. AgilEase    . Probiotic Product (PROBIOTIC DAILY PO) Take 1 tablet by mouth daily.    . SAW PALMETTO-PUMPKIN SEED OIL PO Take 1 tablet by mouth daily.    . timolol (BETIMOL) 0.5 % ophthalmic solution Place 1 drop into both eyes every morning.    . TURMERIC CURCUMIN PO Take 1 capsule by mouth daily with lunch.    . vitamin C (ASCORBIC ACID) 500 MG tablet Take 500 mg by mouth 2 (two) times daily.     No current facility-administered medications for this visit.      Allergies:   Oxycodone   Social History:  The patient  reports that he has never smoked. He has never used smokeless tobacco. He reports that he does not drink alcohol or use drugs.   Family History:   family history includes Cancer in his father; Heart disease in his mother.    Review of Systems: Review of Systems  Constitutional: Negative.   Eyes:       Vision deficit  Respiratory: Negative.   Cardiovascular: Negative.   Gastrointestinal: Negative.   Neurological: Positive for weakness.  Confusion  Psychiatric/Behavioral: Positive for depression.  All other systems reviewed and are negative.    PHYSICAL EXAM: BP 110/70 (BP Location: Left Arm, Patient Position: Sitting, Cuff Size: Normal)   Pulse 90   Ht 6' (1.829 m)   Wt 157 lb 8 oz (71.4 kg)   BMI 21.36 kg/m  Constitutional:  oriented to person, place, and time. No distress.  HENT:  Head: Grossly normal Eyes:  no discharge. No scleral icterus.  Neck: No JVD, no carotid bruits  Cardiovascular: Regular rate and rhythm, no murmurs appreciated Pulmonary/Chest: Clear to auscultation bilaterally, no wheezes or rails Abdominal: Soft.  no distension.  no tenderness.  Musculoskeletal: Normal range of motion Neurological:  normal muscle tone. Coordination normal. No atrophy Skin: Skin warm and dry Psychiatric: normal affect, pleasant   Recent Labs: No results found for requested labs within last 8760 hours.    Lipid  Panel Lab Results  Component Value Date   CHOL 187 07/13/2014   HDL 61.30 07/13/2014   LDLCALC 117 (H) 07/13/2014   TRIG 44.0 07/13/2014      Wt Readings from Last 3 Encounters:  01/17/18 157 lb 8 oz (71.4 kg)  07/23/17 174 lb (78.9 kg)  06/30/16 174 lb 4 oz (79 kg)       ASSESSMENT AND PLAN:  S/P CABG x 2 Denies any anginal symptoms No further ischemic work-up at this time  S/P AVR Echocardiogram July 2019, well-functioning valve  Persistent atrial fibrillation ([place]) Diagnosed in 2017, asymptomatic  on eliquis 5 mg twice a day Rate mildly elevated on today's visit but no room on blood pressure to add beta-blockers  Hypercholesterolemia Family previously declined cholesterol medication  Confusion/weakness Etiology unclear Could consider baseline MRA MRI   Total encounter time more than 25 minutes  Greater than 50% was spent in counseling and coordination of care with the patient   Disposition:   F/U  12 months   Signed, Esmond Plants, M.D., Ph.D. 01/17/2018  [place], [place]

## 2018-01-17 ENCOUNTER — Encounter: Payer: Self-pay | Admitting: Cardiovascular Disease

## 2018-01-17 ENCOUNTER — Ambulatory Visit (INDEPENDENT_AMBULATORY_CARE_PROVIDER_SITE_OTHER): Payer: PPO | Admitting: Cardiovascular Disease

## 2018-01-17 VITALS — BP 110/70 | HR 90 | Ht 72.0 in | Wt 157.5 lb

## 2018-01-17 DIAGNOSIS — I25118 Atherosclerotic heart disease of native coronary artery with other forms of angina pectoris: Secondary | ICD-10-CM | POA: Diagnosis not present

## 2018-01-17 DIAGNOSIS — Z951 Presence of aortocoronary bypass graft: Secondary | ICD-10-CM | POA: Diagnosis not present

## 2018-01-17 DIAGNOSIS — I350 Nonrheumatic aortic (valve) stenosis: Secondary | ICD-10-CM

## 2018-01-17 DIAGNOSIS — Z952 Presence of prosthetic heart valve: Secondary | ICD-10-CM | POA: Diagnosis not present

## 2018-01-17 DIAGNOSIS — I482 Chronic atrial fibrillation, unspecified: Secondary | ICD-10-CM | POA: Diagnosis not present

## 2018-01-17 DIAGNOSIS — E7800 Pure hypercholesterolemia, unspecified: Secondary | ICD-10-CM

## 2018-01-17 NOTE — Patient Instructions (Addendum)

## 2018-01-18 DIAGNOSIS — R410 Disorientation, unspecified: Secondary | ICD-10-CM | POA: Diagnosis not present

## 2018-01-18 DIAGNOSIS — D509 Iron deficiency anemia, unspecified: Secondary | ICD-10-CM | POA: Diagnosis not present

## 2018-01-18 DIAGNOSIS — R05 Cough: Secondary | ICD-10-CM | POA: Diagnosis not present

## 2018-02-23 DIAGNOSIS — R410 Disorientation, unspecified: Secondary | ICD-10-CM | POA: Diagnosis not present

## 2018-02-23 DIAGNOSIS — D509 Iron deficiency anemia, unspecified: Secondary | ICD-10-CM | POA: Diagnosis not present

## 2018-02-23 DIAGNOSIS — J029 Acute pharyngitis, unspecified: Secondary | ICD-10-CM | POA: Diagnosis not present

## 2018-02-23 DIAGNOSIS — M545 Low back pain: Secondary | ICD-10-CM | POA: Diagnosis not present

## 2018-02-23 DIAGNOSIS — R413 Other amnesia: Secondary | ICD-10-CM | POA: Diagnosis not present

## 2018-02-23 DIAGNOSIS — N390 Urinary tract infection, site not specified: Secondary | ICD-10-CM | POA: Diagnosis not present

## 2018-02-23 DIAGNOSIS — Z6822 Body mass index (BMI) 22.0-22.9, adult: Secondary | ICD-10-CM | POA: Diagnosis not present

## 2018-03-11 ENCOUNTER — Encounter: Payer: Self-pay | Admitting: Cardiovascular Disease

## 2018-03-11 DIAGNOSIS — Z6822 Body mass index (BMI) 22.0-22.9, adult: Secondary | ICD-10-CM | POA: Diagnosis not present

## 2018-03-11 DIAGNOSIS — N390 Urinary tract infection, site not specified: Secondary | ICD-10-CM | POA: Diagnosis not present

## 2018-03-11 DIAGNOSIS — R413 Other amnesia: Secondary | ICD-10-CM | POA: Diagnosis not present

## 2018-03-11 DIAGNOSIS — I2510 Atherosclerotic heart disease of native coronary artery without angina pectoris: Secondary | ICD-10-CM | POA: Diagnosis not present

## 2018-03-11 DIAGNOSIS — R531 Weakness: Secondary | ICD-10-CM | POA: Diagnosis not present

## 2018-03-11 DIAGNOSIS — I4891 Unspecified atrial fibrillation: Secondary | ICD-10-CM | POA: Diagnosis not present

## 2018-03-11 DIAGNOSIS — D509 Iron deficiency anemia, unspecified: Secondary | ICD-10-CM | POA: Diagnosis not present

## 2018-03-31 ENCOUNTER — Telehealth: Payer: Self-pay | Admitting: Cardiovascular Disease

## 2018-03-31 NOTE — Telephone Encounter (Signed)
Spoke with patients daughter per release form and she states that since his last visit he has had some health changes. He now has leg swelling below the knee and never had this before today. She denies any shortness of breath for him but the swelling is still a issue. She also states that he is having some memory changes as well. He has had some health changes and his mobility has changed and no longer able to move around or do anything due to loss of sight. Weight is down and she states that since last visit his health changes have really caused problems. Reviewed compression socks, leg elevation, decrease sodium intake and monitoring of daily weights. Advised that I would send to provider for review and would be in touch with any further recommendations. She was very appreciative for the review and was agreeable to try things without bringing him in if possible due to virus concerns.

## 2018-03-31 NOTE — Telephone Encounter (Signed)
Pt c/o swelling: STAT is pt has developed SOB within 24 hours  1) How much weight have you gained and in what time span? No weight gain   2) If swelling, where is the swelling located? leg swelling   3) Are you currently taking a fluid pill? Does not have fluid pills  4) Are you currently SOB? No but has not been active  5) Do you have a log of your daily weights (if so, list)?   6) Have you gained 3 pounds in a day or 5 pounds in a week?   7) Have you traveled recently?   Patient daughter Lattie Haw calling.  States that the patient is not doing well and has been dealing with leg swelling.  Would like to discuss with nurse on what steps to take, please call Lattie Haw to discuss.

## 2018-04-02 NOTE — Telephone Encounter (Signed)
Needs e-visit.  

## 2018-04-04 ENCOUNTER — Other Ambulatory Visit: Payer: Self-pay

## 2018-04-04 ENCOUNTER — Telehealth (INDEPENDENT_AMBULATORY_CARE_PROVIDER_SITE_OTHER): Payer: PPO | Admitting: Cardiovascular Disease

## 2018-04-04 DIAGNOSIS — Z951 Presence of aortocoronary bypass graft: Secondary | ICD-10-CM | POA: Diagnosis not present

## 2018-04-04 DIAGNOSIS — Z954 Presence of other heart-valve replacement: Secondary | ICD-10-CM

## 2018-04-04 DIAGNOSIS — I519 Heart disease, unspecified: Secondary | ICD-10-CM | POA: Diagnosis not present

## 2018-04-04 DIAGNOSIS — I482 Chronic atrial fibrillation, unspecified: Secondary | ICD-10-CM

## 2018-04-04 DIAGNOSIS — I25118 Atherosclerotic heart disease of native coronary artery with other forms of angina pectoris: Secondary | ICD-10-CM | POA: Diagnosis not present

## 2018-04-04 DIAGNOSIS — I5189 Other ill-defined heart diseases: Secondary | ICD-10-CM

## 2018-04-04 DIAGNOSIS — I350 Nonrheumatic aortic (valve) stenosis: Secondary | ICD-10-CM | POA: Diagnosis not present

## 2018-04-04 NOTE — Patient Instructions (Signed)
Per conversation edema has improved somewhat Please call if lower extremity edema gets worse If edema gets worse, lab work could be performed such as BNP For now would recommend leg elevation, compression hose   Medication Instructions:  No changes  If you need a refill on your cardiac medications before your next appointment, please call your pharmacy.    Lab work: No new labs needed   If you have labs (blood work) drawn today and your tests are completely normal, you will receive your results only by: Marland Kitchen MyChart Message (if you have MyChart) OR . A paper copy in the mail If you have any lab test that is abnormal or we need to change your treatment, we will call you to review the results.   Testing/Procedures: No new testing needed   Follow-Up: At [facility], you and your health needs are our priority.  As part of our continuing mission to provide you with exceptional heart care, we have created designated Provider Care Teams.  These Care Teams include your primary Cardiologist (physician) and Advanced Practice Providers (APPs -  Physician Assistants and Nurse Practitioners) who all work together to provide you with the care you need, when you need it.  . You will need a follow up appointment in 3 months .   Please call our office 2 months in advance to schedule this appointment.    . Providers on your designated Care Team:   . Murray Hodgkins, NP . Christell Faith, PA-C . Marrianne Mood, PA-C  Any Other Special Instructions Will Be Listed Below (If Applicable).  For educational health videos Log in to : [email or web address] Or : [email or web address], password : [email or web address]

## 2018-04-04 NOTE — Progress Notes (Signed)
Aug or sept of 2020 for echo

## 2018-04-04 NOTE — Progress Notes (Signed)
Telephone Visit     Evaluation Performed:  Follow-up visit  This visit type was conducted due to national recommendations for restrictions regarding the COVID-19 Pandemic (e.g. social distancing).  This format is felt to be most appropriate for this patient at this time.  All issues noted in this document were discussed and addressed.  No physical exam was performed (except for noted visual exam findings with Telehealth visits).  See MyChart message from today for the patient's consent to telehealth for [employer]  Date:  04/04/2018   ID:  [name] [name], DOB 1940/02/07, MRN [id number]  Patient Location:  *[place] [address]   Provider location:   [place], [place] office  PCP:  Cyndi Bender, PA-C  Cardiologist:  Rockey Situ,   Chief Complaint:  Leg Swelling  History of Present Illness:    [name] [name] is a 78 y.o. male who presents via audio/video conferencing for a telehealth visit today.  history of  Permanent atrial fibrillation coronary artery disease,  bypass surgery December 2015 with bioprosthetic aortic valve  prior AVR 2005, redo December 2015 CABG, AVR hyperlipidemia, declined cholesterol medication ejection fraction July 2019 was  60%  atrial fibrillation on routine EKG in preop clinic before hip surgery, Dx in 2017, asymptomatic who presents for routine follow-up of his coronary disease, aortic valve prosthesis   The patient does not symptoms concerning for COVID-19 infection (fever, chills, cough, or new SHORTNESS OF BREATH).   We received a phone call March 31, 2018 concerning swelling bilateral legs to below the knee Family reported swelling was new Denies shortness of breath Memory changes are new Mobility poor, no longer to able move around due to loss of eyesight Weight is down  Weight on prior clinic visit 157 pounds  Most of the history obtained from patient's daughter UTI x2 Dec 2019 and feb 2020 Slow to  recover, was weak  Can't walk far, Leg swollen (new), last week started Better last two  When sits on porch, leg down PMD says iron is low Appetite getting better Socks pinching 1/2   Weight and pressure up " a little"   Prior CV studies:   The following studies were reviewed today: Echocardiogram July 2019 - Left ventricle: The cavity size was normal. Systolic function was   normal. The estimated ejection fraction was in the range of 55%   to 60%. Wall motion was normal; there were no regional wall   motion abnormalities. - Aortic valve: A bioprosthesis was present. Mean velocity (S): 102   cm/s. Mean gradient (S): 5 mm Hg. - Mitral valve: Calcified annulus. - Left atrium: The atrium was moderately dilated. - Right ventricle: Systolic function was normal. - Tricuspid valve: There was mild-moderate regurgitation. - Pulmonary arteries: Systolic pressure was within the normal   range.  Past Medical History:  Diagnosis Date  . Aortic stenosis   . Arthritis   . CHF (congestive heart failure) (HCC)    takes Furosemide daily  . Coronary artery disease   . Glaucoma    uses eye drops daily  . Heart murmur   . Hyperlipidemia   . Hypertension   . Vision loss    Past Surgical History:  Procedure Laterality Date  . AORTIC VALVE REPLACEMENT N/A 12/27/2013   Procedure: REDO AORTIC VALVE REPLACEMENT WITH 23MM MAGNA EASE PERICARDIAL TISSUE VALVE;  Surgeon: Grace Isaac, MD;  Location: [place];  Service: Open Heart Surgery;  Laterality: N/A;  .  CARDIAC CATHETERIZATION  02/08/2003   severe aortic stenosis,normal left ventricular function  . CORONARY ARTERY BYPASS GRAFT N/A 12/27/2013   Procedure: CORONARY ARTERY BYPASS GRAFTING TIMES TWO USING RIGHT LEG SAPHENOUS VEIN TO RIGHT CORONARY ARTERY AND LEFT INTERNAL MAMMARY ARTERY TO LEFT ANTERIOR DESCENDING ARTERY;  Surgeon: Grace Isaac, MD;  Location: [place];  Service: Open Heart Surgery;  Laterality: N/A;  . HERNIA REPAIR    .  INGUINAL HERNIA REPAIR  04/2003  . INGUINAL HERNIA REPAIR Left 12/18/2014   Procedure: OPEN LEFT INGUINAL HERNIA REPAIR;  Surgeon: Excell Seltzer, MD;  [facility];  Service: General;  Laterality: Left;  . INSERTION OF MESH Left 12/18/2014   Procedure: INSERTION OF MESH;  Surgeon: Excell Seltzer, MD;  [facility];  Service: General;  Laterality: Left;  . LEFT AND RIGHT HEART CATHETERIZATION WITH CORONARY ANGIOGRAM N/A 12/18/2013   Procedure: LEFT AND RIGHT HEART CATHETERIZATION WITH CORONARY ANGIOGRAM;  Surgeon: Peter M Martinique, MD;  Location: [facility] C[facility] LAB;  Service: Cardiovascular;  Laterality: N/A;  . TEE WITHOUT CARDIOVERSION N/A 12/27/2013   Procedure: TRANSESOPHAGEAL ECHOCARDIOGRAM (TEE);  Surgeon: Grace Isaac, MD;  Location: [place];  Service: Open Heart Surgery;  Laterality: N/A;  . TISSUE AORTIC VALVE REPLACEMENT  02/09/2003   # 25 pericardial tissue valve  . TOTAL HIP ARTHROPLASTY Right 10/09/2015   Procedure: RIGHT TOTAL HIP ARTHROPLASTY;  Surgeon: Latanya Maudlin, MD;  [facility];  Service: Orthopedics;  Laterality: Right;     No outpatient medications have been marked as taking for the 04/04/18 encounter (Appointment) with Minna Merritts, MD.     Allergies:   Oxycodone   Social History   Tobacco Use  . Smoking status: Never Smoker  . Smokeless tobacco: Never Used  Substance Use Topics  . Alcohol use: No  . Drug use: No     Family Hx: The patient's family history includes Cancer in his father; Heart disease in his mother.  ROS:   Please see the history of present illness.    Review of Systems  Constitutional: Negative.   Respiratory: Negative.   Cardiovascular: Positive for leg swelling.  Gastrointestinal: Negative.   Musculoskeletal: Negative.   Neurological: Negative.   Psychiatric/Behavioral: Negative.   All other systems reviewed and are negative.   Labs/Other Tests and Data Reviewed:    Recent Labs: No results found for requested  labs within last 8760 hours.   Recent Lipid Panel Lab Results  Component Value Date/Time   CHOL 187 07/13/2014 09:04 AM   TRIG 44.0 07/13/2014 09:04 AM   HDL 61.30 07/13/2014 09:04 AM   CHOLHDL 3 07/13/2014 09:04 AM   LDLCALC 117 (H) 07/13/2014 09:04 AM    Wt Readings from Last 3 Encounters:  01/17/18 157 lb 8 oz (71.4 kg)  07/23/17 174 lb (78.9 kg)  06/30/16 174 lb 4 oz (79 kg)     Exam:    Vital Signs:  There were no vitals taken for this visit.   Well nourished, well developed male in no acute distress. Minimally pitting leg edema   ASSESSMENT & PLAN:    1.  Atherosclerosis of native coronary artery of native heart with stable angina pectoris (HCC)Currently with no symptoms of angina. No further workup at this time. Continue current medication regimen.  Chronic atrial fibrillation Rate well controlled, tolerating anticoagulation Stay on Eliquis 5 twice daily  Aortic valve stenosis, etiology of cardiac valve disease unspecified H/O aortic valve replacement with tissue graft We will arrange  for echocardiogram in several months time  Lower extremity edema New finding, suspect secondary to dependent edema Sock cutting into his mid shin Symptoms seem to have improved Spending lots of time with legs down Also complicated by anemia per the daughter, now on iron pill, swelling may be improving as anemia resolves We will request lab work from primary care Recommend if swelling comes back we could order BNP,  Might need to have Lasix as needed Currently she does not think symptoms are worth the diuretic at this time Recommend she call us back if symptoms recur  COVID-19 Education: The signs and symptoms of COVID-19 were discussed with the patient and how to seek care for testing (follow up with PCP or arrange E-visit).  The importance of social distancing was discussed today.  Patient Risk:   After full review of this patients clinical status, I feel that they are at  least moderate risk at this time.  Time:   Today, I have spent 25 minutes with the patient with telehealth technology discussing etiology of lower extremity edema including dependent edema, CHF, anemia, poor nutrition, low protein and how pertains to the patient Dietary guide provided. We will request lab work from primary care   Medication Adjustments/Labs and Tests Ordered: Current medicines are reviewed at length with the patient today.  Concerns regarding medicines are outlined above.  Tests Ordered: No orders of the defined types were placed in this encounter.  Medication Changes: No orders of the defined types were placed in this encounter.   Total encounter time more than 25 minutes  Greater than 50% was spent in counseling and coordination of care with the patient  Disposition: 3 months time  Signed, Ida Rogue, MD  04/04/2018 1:20 PM    [facility] [address], [place], [place]  [address] Phone: [phone]; Fax: [phone]

## 2018-04-04 NOTE — Telephone Encounter (Signed)
Patient had an e-visit with Dr. Rockey Situ today.

## 2018-04-05 ENCOUNTER — Other Ambulatory Visit: Payer: Self-pay | Admitting: *Deleted

## 2018-04-05 DIAGNOSIS — I350 Nonrheumatic aortic (valve) stenosis: Secondary | ICD-10-CM

## 2018-04-05 NOTE — Addendum Note (Signed)
Addended by: Valora Corporal on: 04/05/2018 02:21 PM   Modules accepted: Orders

## 2018-04-05 NOTE — Progress Notes (Signed)
Echo order entered.

## 2018-04-05 NOTE — Progress Notes (Signed)
The patient's most recent lab work obtained from [place], [place]. Will scan in Epic for Dr. Rockey Situ to review.  Echo order entered for 08/2018.

## 2018-05-24 DIAGNOSIS — H401233 Low-tension glaucoma, bilateral, severe stage: Secondary | ICD-10-CM | POA: Diagnosis not present

## 2018-06-20 DIAGNOSIS — R413 Other amnesia: Secondary | ICD-10-CM | POA: Diagnosis not present

## 2018-06-20 DIAGNOSIS — I4891 Unspecified atrial fibrillation: Secondary | ICD-10-CM | POA: Diagnosis not present

## 2018-06-20 DIAGNOSIS — I2510 Atherosclerotic heart disease of native coronary artery without angina pectoris: Secondary | ICD-10-CM | POA: Diagnosis not present

## 2018-06-20 DIAGNOSIS — R7989 Other specified abnormal findings of blood chemistry: Secondary | ICD-10-CM | POA: Diagnosis not present

## 2018-06-20 DIAGNOSIS — R634 Abnormal weight loss: Secondary | ICD-10-CM | POA: Diagnosis not present

## 2018-06-20 DIAGNOSIS — Z6822 Body mass index (BMI) 22.0-22.9, adult: Secondary | ICD-10-CM | POA: Diagnosis not present

## 2018-06-20 DIAGNOSIS — D509 Iron deficiency anemia, unspecified: Secondary | ICD-10-CM | POA: Diagnosis not present

## 2018-06-23 ENCOUNTER — Other Ambulatory Visit: Payer: Self-pay | Admitting: Physician Assistant

## 2018-06-24 ENCOUNTER — Other Ambulatory Visit: Payer: Self-pay | Admitting: Physician Assistant

## 2018-06-24 DIAGNOSIS — R413 Other amnesia: Secondary | ICD-10-CM

## 2018-07-08 ENCOUNTER — Ambulatory Visit
Admission: RE | Admit: 2018-07-08 | Discharge: 2018-07-08 | Disposition: A | Discharge status: Home / Self Care | Payer: PPO | Source: Ambulatory Visit | Attending: Physician Assistant | Admitting: Physician Assistant

## 2018-07-08 ENCOUNTER — Other Ambulatory Visit: Payer: Self-pay

## 2018-07-08 DIAGNOSIS — R413 Other amnesia: Secondary | ICD-10-CM | POA: Insufficient documentation

## 2018-07-08 LAB — POCT I-STAT CREATININE: Creatinine, Ser: 0.7 mg/dL (ref 0.61–1.24)

## 2018-07-08 MED ORDER — GADOBUTROL 1 MMOL/ML IV SOLN
7.0000 mL | Freq: Once | INTRAVENOUS | Status: AC | PRN
  Administered 2018-07-08: 7 mL via INTRAVENOUS

## 2018-08-15 ENCOUNTER — Other Ambulatory Visit: Payer: Self-pay

## 2018-08-15 MED ORDER — APIXABAN 5 MG PO TABS: 5.0000 mg | ORAL_TABLET | Freq: Two times a day (BID) | ORAL | 2 refills | Status: DC

## 2018-08-15 NOTE — Telephone Encounter (Signed)
Pt's age 78, wt 71.4 kg, SCr 0.7 (07/08/18), CrCl 89.25, last appt w/ Dr. Rockey Situ 04/04/18.

## 2018-08-16 ENCOUNTER — Ambulatory Visit (INDEPENDENT_AMBULATORY_CARE_PROVIDER_SITE_OTHER): Payer: PPO

## 2018-08-16 ENCOUNTER — Other Ambulatory Visit: Payer: Self-pay

## 2018-08-16 DIAGNOSIS — I350 Nonrheumatic aortic (valve) stenosis: Secondary | ICD-10-CM | POA: Diagnosis not present

## 2018-09-01 ENCOUNTER — Encounter: Payer: Self-pay | Admitting: Cardiovascular Disease

## 2018-09-01 ENCOUNTER — Other Ambulatory Visit: Payer: Self-pay

## 2018-09-01 ENCOUNTER — Ambulatory Visit (INDEPENDENT_AMBULATORY_CARE_PROVIDER_SITE_OTHER): Payer: PPO | Admitting: Cardiovascular Disease

## 2018-09-01 VITALS — BP 120/82 | HR 74 | Ht 72.0 in | Wt 153.0 lb

## 2018-09-01 DIAGNOSIS — I482 Chronic atrial fibrillation, unspecified: Secondary | ICD-10-CM

## 2018-09-01 DIAGNOSIS — I25118 Atherosclerotic heart disease of native coronary artery with other forms of angina pectoris: Secondary | ICD-10-CM | POA: Diagnosis not present

## 2018-09-01 DIAGNOSIS — I10 Essential (primary) hypertension: Secondary | ICD-10-CM

## 2018-09-01 DIAGNOSIS — I350 Nonrheumatic aortic (valve) stenosis: Secondary | ICD-10-CM

## 2018-09-01 DIAGNOSIS — Z954 Presence of other heart-valve replacement: Secondary | ICD-10-CM | POA: Diagnosis not present

## 2018-09-01 NOTE — Progress Notes (Signed)
Cardiology Office Note  Date:  09/01/2018   ID:  Ladarren, Estime [birth year], MRN [id number]  PCP:  Cyndi Bender, PA-C   Chief Complaint  Patient presents with  . Other    3 month follow up. Meds reviewed verbally with patient.    HPI:  Mr. Lockamy is a pleasant 78 year old gentleman with a history of  Permanent atrial fibrillation coronary artery disease,  bypass surgery December 2015 with bioprosthetic aortic valve  prior AVR 2005, redo December 2015 CABG, AVR hyperlipidemia, declined cholesterol medication Most recent ejection fraction March 2016 was greater than 60%  atrial fibrillation on routine EKG in preop clinic before hip surgery, Dx in 2017, asymptomatic who presents for routine follow-up of his coronary disease, aortic valve prosthesis   Most of the history obtained from patient's daughter who presents with him today Dementia is getting worse Evaluated by primary care, recent MRI  On prior office visit reported having leg swelling  March 31, 2018 swelling bilateral legs to below the knee  swelling was new Mobility poor,  dependent edema, anemia In hindsight daughter feels lower extremity swelling was second to him being on his feet for long periods of time Swelling seem to resolve without intervention  Recent MRI results discussed with family in detail MRI showing atrophy, small vessel disease  They report he is having worsening Memory issues On today's visit could not sit still had to get out of his chair walk around, opening exam room door  Weight down 20 pounds in one year They are working on trying to maintain his weight  EKG personally reviewed by myself on todays visit Shows atrial fibrillation ventricular rate 74 bpm nonspecific ST abnormality left axis deviation  Other past medical history reviewed Prior history UTIs   Prior CV studies:   The following studies were reviewed today: Echocardiogram July 2019 - Left ventricle: The cavity size was  normal. Systolic function was   normal. The estimated ejection fraction was in the range of 55%   to 60%. Wall motion was normal; there were no regional wall   motion abnormalities. - Aortic valve: A bioprosthesis was present. Mean velocity (S): 102   cm/s. Mean gradient (S): 5 mm Hg. - Mitral valve: Calcified annulus. - Left atrium: The atrium was moderately dilated. - Right ventricle: Systolic function was normal. - Tricuspid valve: There was mild-moderate regurgitation. - Pulmonary arteries: Systolic pressure was within the normal   range.  In follow-up today presents with his daughter She reports that he has had acute onset of various symptoms including headache, fall, vision changes Since these acute symptoms presented he has had weight loss of 17 pounds Unclear if he was having upper respiratory infection or urinary tract infection was treated with Z-Pak Little more energy recently after ABX per the daughter Vision changes still affecting his ability to do things around the house Confusion, weakness has persisted perhaps mildly improved Appetite little better but still poor Low BP, presumably from weight loss Sleeping more Legs are markedly weak getting up from chair to the exam table  He denies any significant shortness of breath tolerating eliquis Used to work in his workshop, 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   echocardiogram on 12/12/13 which showed evidence of prosthetic valve failure with moderate  aortic stenosis and moderate aortic insufficiency. His left ventricle was dilated and his ejection fraction had fallen to 30-35%.    cardiac catheterization and then underwent a redo aortic valve replacement with a pericardial tissue valve as well as two-vessel coronary artery bypass graft surgery by Dr. Servando Snare on 12/27/13. The patient had postoperative atrial fibrillation.  He was discharged home on amiodarone   PMH:   has a past medical history of Aortic stenosis, Arthritis, CHF (congestive heart failure) ([place]), Coronary artery disease, Glaucoma, Heart murmur, Hyperlipidemia, Hypertension, and Vision loss.  PSH:    Past Surgical History:  Procedure Laterality Date  . AORTIC VALVE REPLACEMENT N/A 12/27/2013   Procedure: REDO AORTIC VALVE REPLACEMENT WITH 23MM MAGNA EASE PERICARDIAL TISSUE VALVE;  Surgeon: Grace Isaac, MD;  Location: [facility];  Service: Open Heart Surgery;  Laterality: N/A;  . CARDIAC CATHETERIZATION  02/08/2003   severe aortic stenosis,normal left ventricular function  . CORONARY ARTERY BYPASS GRAFT N/A 12/27/2013   Procedure: CORONARY ARTERY BYPASS GRAFTING TIMES TWO USING RIGHT LEG SAPHENOUS VEIN TO RIGHT CORONARY ARTERY AND LEFT INTERNAL MAMMARY ARTERY TO LEFT ANTERIOR DESCENDING ARTERY;  Surgeon: Grace Isaac, MD;  Location: [facility];  Service: Open Heart Surgery;  Laterality: N/A;  . HERNIA REPAIR    . INGUINAL HERNIA REPAIR  04/2003  . INGUINAL HERNIA REPAIR Left 12/18/2014   Procedure: OPEN LEFT INGUINAL HERNIA REPAIR;  Surgeon: Excell Seltzer, MD;  Location: [facility];  Service: General;  Laterality: Left;  . INSERTION OF MESH Left 12/18/2014   Procedure: INSERTION OF MESH;  Surgeon: Excell Seltzer, MD;  Location: [facility];  Service: General;  Laterality: Left;  . LEFT AND RIGHT HEART CATHETERIZATION WITH CORONARY ANGIOGRAM N/A 12/18/2013   Procedure: LEFT AND RIGHT HEART CATHETERIZATION WITH CORONARY ANGIOGRAM;  Surgeon: Peter M Martinique, MD;  [facility] C[facility];  Service: Cardiovascular;  Laterality: N/A;  . TEE WITHOUT CARDIOVERSION N/A 12/27/2013   Procedure: TRANSESOPHAGEAL ECHOCARDIOGRAM (TEE);  Surgeon: Grace Isaac, MD;  Location: [facility];  Service: Open Heart Surgery;  Laterality: N/A;  . TISSUE AORTIC VALVE REPLACEMENT  02/09/2003   # 25 pericardial tissue valve  . TOTAL HIP ARTHROPLASTY Right 10/09/2015   Procedure: RIGHT TOTAL HIP ARTHROPLASTY;  Surgeon: Latanya Maudlin, MD;  Location: [facility];  Service: Orthopedics;  Laterality: Right;    Current Outpatient Medications  Medication Sig Dispense Refill  . apixaban (ELIQUIS) 5 MG TABS tablet Take 1 tablet (5 mg total) by mouth 2 (two) times daily. 180 tablet 2  . brimonidine (ALPHAGAN) 0.2 % ophthalmic solution Place 1 drop into both eyes 2 (two) times daily.    . Cranberry 400 MG CAPS Take 400 mg by mouth at bedtime.    Marland Kitchen latanoprost (XALATAN) 0.005 % ophthalmic solution Place 1 drop into both eyes at bedtime.     . Methylsulfonylmethane (MSM) 1000 MG CAPS Take 8,000-12,000 mg by mouth 4 (four) times daily.     . Omega-3 Fatty Acids (OMEGA-3 FISH OIL PO) Take 1 capsule by mouth as needed.     Marland Kitchen OVER THE COUNTER MEDICATION Take 400 mg by mouth daily. D-Mannose 400mg     . OVER THE COUNTER MEDICATION Take 1 capsule by mouth daily with lunch. AgilEase    .  Probiotic Product (PROBIOTIC DAILY PO) Take 1 tablet by mouth daily.    . SAW PALMETTO-PUMPKIN SEED OIL PO Take 1 tablet by mouth daily.    . timolol (BETIMOL) 0.5 % ophthalmic solution Place 1 drop into both eyes every morning.    . TURMERIC CURCUMIN PO Take 1 capsule by mouth daily with lunch.    . vitamin C (ASCORBIC ACID) 500 MG tablet Take 500 mg by mouth 2 (two) times daily.     No current facility-administered medications for this visit.      Allergies:   Oxycodone   Social History:  The patient  reports that he has never smoked. He has never used smokeless tobacco. He reports that he does not drink alcohol or use drugs.    Family History:   family history includes Cancer in his father; Heart disease in his mother.    Review of Systems: Review of Systems  Constitutional: Negative.   Eyes:       Vision deficit  Respiratory: Negative.   Cardiovascular: Negative.   Gastrointestinal: Negative.   Neurological: Negative.        Confusion  Psychiatric/Behavioral: Positive for memory loss.  All other systems reviewed and are negative.    PHYSICAL EXAM: BP 120/82 (BP Location: Right Arm, Patient Position: Sitting, Cuff Size: Normal)   Pulse 74   Ht 6' (1.829 m)   Wt 153 lb (69.4 kg)   BMI 20.75 kg/m  Constitutional:  oriented to person, place, and time. No distress.  HENT:  Head: Grossly normal Eyes:  no discharge. No scleral icterus.  Neck: No JVD, no carotid bruits  Cardiovascular: Regular rate and rhythm, no murmurs appreciated Pulmonary/Chest: Clear to auscultation bilaterally, no wheezes or rails Abdominal: Soft.  no distension.  no tenderness.  Musculoskeletal: Normal range of motion Neurological:  normal muscle tone. Coordination normal. No atrophy Skin: Skin warm and dry Psychiatric: normal affect, pleasant   Recent Labs: 07/08/2018: Creatinine, Ser 0.70    Lipid Panel Lab Results  Component Value Date   CHOL 187 07/13/2014   HDL 61.30 07/13/2014   LDLCALC 117 (H) 07/13/2014   TRIG 44.0 07/13/2014      Wt Readings from Last 3 Encounters:  09/01/18 153 lb (69.4 kg)  01/17/18 157 lb 8 oz (71.4 kg)  07/23/17 174 lb (78.9 kg)       ASSESSMENT AND PLAN:  S/P CABG x 2 Denies any anginal symptoms No further ischemic work-up at this time  S/P AVR Echocardiogram performed August 2020, well-functioning valve Ejection fraction 50 to 55% mildly reduced RV function with severely dilated left atrium moderate TR  Persistent atrial fibrillation (HCC) Diagnosed in 2017, asymptomatic  on eliquis 5 mg twice a day Unclear if there would be any benefit to adding low-dose aspirin in  the setting of aortic valve replacement or if this would increase his bleeding risk We will research  Hypercholesterolemia Family previously declined cholesterol medication Discussed with him again on today's visit  Memory loss Recent MRI, small vessel disease, atrophy   Total encounter time more than 25 minutes  Greater than 50% was spent in counseling and coordination of care with the patient   Disposition:   F/U  12 months   Signed, Esmond Plants, M.D., Ph.D. 09/01/2018  [place], [place] [phone]

## 2018-09-01 NOTE — Patient Instructions (Addendum)
I will research add aspirin   Medication Instructions:  No changes  If you need a refill on your cardiac medications before your next appointment, please call your pharmacy.    Lab work: No new labs needed   If you have labs (blood work) drawn today and your tests are completely normal, you will receive your results only by: Marland Kitchen MyChart Message (if you have MyChart) OR . A paper copy in the mail If you have any lab test that is abnormal or we need to change your treatment, we will call you to review the results.   Testing/Procedures: No new testing needed   Follow-Up: At [facility], you and your health needs are our priority.  As part of our continuing mission to provide you with exceptional heart care, we have created designated Provider Care Teams.  These Care Teams include your primary Cardiologist (physician) and Advanced Practice Providers (APPs -  Physician Assistants and Nurse Practitioners) who all work together to provide you with the care you need, when you need it.  . You will need a follow up appointment in 12 months .   Please call our office 2 months in advance to schedule this appointment.    . Providers on your designated Care Team:   . Murray Hodgkins, NP . Christell Faith, PA-C . Marrianne Mood, PA-C  Any Other Special Instructions Will Be Listed Below (If Applicable).  For educational health videos Log in to : [email or web address] Or : [email or web address], password : [email or web address]

## 2018-10-06 DIAGNOSIS — Z6822 Body mass index (BMI) 22.0-22.9, adult: Secondary | ICD-10-CM | POA: Diagnosis not present

## 2018-10-06 DIAGNOSIS — I2510 Atherosclerotic heart disease of native coronary artery without angina pectoris: Secondary | ICD-10-CM | POA: Diagnosis not present

## 2018-10-06 DIAGNOSIS — D509 Iron deficiency anemia, unspecified: Secondary | ICD-10-CM | POA: Diagnosis not present

## 2018-10-06 DIAGNOSIS — I4891 Unspecified atrial fibrillation: Secondary | ICD-10-CM | POA: Diagnosis not present

## 2018-10-06 DIAGNOSIS — F0150 Vascular dementia without behavioral disturbance: Secondary | ICD-10-CM | POA: Diagnosis not present

## 2018-11-22 DIAGNOSIS — H401233 Low-tension glaucoma, bilateral, severe stage: Secondary | ICD-10-CM | POA: Diagnosis not present

## 2018-12-01 DIAGNOSIS — H401233 Low-tension glaucoma, bilateral, severe stage: Secondary | ICD-10-CM | POA: Diagnosis not present

## 2019-01-11 ENCOUNTER — Telehealth: Payer: Self-pay | Admitting: Cardiovascular Disease

## 2019-01-11 NOTE — Telephone Encounter (Addendum)
Spoke with Lattie Haw (daughter) to pick up samples for Eliquis.    Medication Samples have been provided to the patient.  Drug name: Eliquis        Strength: 5 mg         Qty: 3 Boxes  LOT: [id number] Exp.Date: 01/2021  Dolores Lory 12:05 PM 01/11/2019

## 2019-01-11 NOTE — Telephone Encounter (Signed)
Patient calling the office for samples of medication:   1.  What medication and dosage are you requesting samples for? Eliquis  2.  Are you currently out of this medication? Yes, just needs 2 days worth to tie him over if able  Please advise daughter Nelida Gores, [phone]

## 2019-04-24 ENCOUNTER — Other Ambulatory Visit: Payer: Self-pay | Admitting: Cardiovascular Disease

## 2019-04-24 NOTE — Telephone Encounter (Signed)
Please review for refill. Thanks!  

## 2019-06-01 DIAGNOSIS — H401233 Low-tension glaucoma, bilateral, severe stage: Secondary | ICD-10-CM | POA: Diagnosis not present

## 2019-06-30 DIAGNOSIS — Z79899 Other long term (current) drug therapy: Secondary | ICD-10-CM | POA: Diagnosis not present

## 2019-06-30 DIAGNOSIS — Z6825 Body mass index (BMI) 25.0-25.9, adult: Secondary | ICD-10-CM | POA: Diagnosis not present

## 2019-06-30 DIAGNOSIS — L84 Corns and callosities: Secondary | ICD-10-CM | POA: Diagnosis not present

## 2019-06-30 DIAGNOSIS — I2510 Atherosclerotic heart disease of native coronary artery without angina pectoris: Secondary | ICD-10-CM | POA: Diagnosis not present

## 2019-06-30 DIAGNOSIS — F0151 Vascular dementia with behavioral disturbance: Secondary | ICD-10-CM | POA: Diagnosis not present

## 2019-06-30 DIAGNOSIS — I4891 Unspecified atrial fibrillation: Secondary | ICD-10-CM | POA: Diagnosis not present

## 2020-02-13 DEATH — deceased

## 2020-02-23 ENCOUNTER — Telehealth: Payer: Self-pay | Admitting: Cardiovascular Disease

## 2020-02-23 NOTE — Telephone Encounter (Signed)
Daughter made office aware patient has recently passed, recall has been removed
